# Patient Record
Sex: Female | Born: 1935 | Race: Black or African American | Hispanic: No | Smoking: Never smoker
Health system: Southern US, Community
[De-identification: ages and names within clinical notes are randomized; demographics above are authoritative.]

## PROBLEM LIST (undated history)

## (undated) DIAGNOSIS — R5381 Other malaise: Secondary | ICD-10-CM

## (undated) DIAGNOSIS — L0390 Cellulitis, unspecified: Secondary | ICD-10-CM

## (undated) DIAGNOSIS — Z0000 Encounter for general adult medical examination without abnormal findings: Secondary | ICD-10-CM

## (undated) DIAGNOSIS — R030 Elevated blood-pressure reading, without diagnosis of hypertension: Secondary | ICD-10-CM

## (undated) DIAGNOSIS — M19019 Primary osteoarthritis, unspecified shoulder: Secondary | ICD-10-CM

## (undated) DIAGNOSIS — I890 Lymphedema, not elsewhere classified: Secondary | ICD-10-CM

## (undated) DIAGNOSIS — N289 Disorder of kidney and ureter, unspecified: Secondary | ICD-10-CM

## (undated) DIAGNOSIS — D3A8 Other benign neuroendocrine tumors: Secondary | ICD-10-CM

## (undated) DIAGNOSIS — M1611 Unilateral primary osteoarthritis, right hip: Secondary | ICD-10-CM

## (undated) DIAGNOSIS — M6281 Muscle weakness (generalized): Secondary | ICD-10-CM

## (undated) DIAGNOSIS — M7550 Bursitis of unspecified shoulder: Secondary | ICD-10-CM

## (undated) DIAGNOSIS — C801 Malignant (primary) neoplasm, unspecified: Secondary | ICD-10-CM

## (undated) DIAGNOSIS — Z9119 Patient's noncompliance with other medical treatment and regimen: Secondary | ICD-10-CM

## (undated) DIAGNOSIS — I10 Essential (primary) hypertension: Secondary | ICD-10-CM

## (undated) DIAGNOSIS — Z91199 Patient's noncompliance with other medical treatment and regimen due to unspecified reason: Secondary | ICD-10-CM

## (undated) DIAGNOSIS — K140 Glossitis: Secondary | ICD-10-CM

## (undated) DIAGNOSIS — C50919 Malignant neoplasm of unspecified site of unspecified female breast: Secondary | ICD-10-CM

## (undated) DIAGNOSIS — R350 Frequency of micturition: Secondary | ICD-10-CM

## (undated) DIAGNOSIS — N63 Unspecified lump in unspecified breast: Secondary | ICD-10-CM

## (undated) DIAGNOSIS — M2550 Pain in unspecified joint: Secondary | ICD-10-CM

## (undated) DIAGNOSIS — K219 Gastro-esophageal reflux disease without esophagitis: Secondary | ICD-10-CM

## (undated) DIAGNOSIS — Z79811 Long term (current) use of aromatase inhibitors: Secondary | ICD-10-CM

## (undated) DIAGNOSIS — L304 Erythema intertrigo: Secondary | ICD-10-CM

## (undated) DIAGNOSIS — J302 Other seasonal allergic rhinitis: Secondary | ICD-10-CM

## (undated) DIAGNOSIS — R928 Other abnormal and inconclusive findings on diagnostic imaging of breast: Secondary | ICD-10-CM

## (undated) DIAGNOSIS — M069 Rheumatoid arthritis, unspecified: Secondary | ICD-10-CM

## (undated) DIAGNOSIS — L209 Atopic dermatitis, unspecified: Secondary | ICD-10-CM

## (undated) DIAGNOSIS — M1990 Unspecified osteoarthritis, unspecified site: Secondary | ICD-10-CM

## (undated) DIAGNOSIS — R1310 Dysphagia, unspecified: Secondary | ICD-10-CM

## (undated) HISTORY — DX: Unspecified osteoarthritis, unspecified site: M19.90

## (undated) HISTORY — DX: Pain in unspecified joint: M25.50

## (undated) HISTORY — DX: Cellulitis, unspecified: L03.90

## (undated) HISTORY — DX: Glossitis: K14.0

## (undated) HISTORY — DX: Bursitis of unspecified shoulder: M75.50

## (undated) HISTORY — DX: Other seasonal allergic rhinitis: J30.2

## (undated) HISTORY — DX: Malignant neoplasm of unspecified site of unspecified female breast: C50.919

## (undated) HISTORY — DX: Dysphagia, unspecified: R13.10

## (undated) HISTORY — DX: Other malaise: R53.81

## (undated) HISTORY — DX: Patient's noncompliance with other medical treatment and regimen due to unspecified reason: Z91.199

## (undated) HISTORY — DX: Encounter for general adult medical examination without abnormal findings: Z00.00

## (undated) HISTORY — DX: Atopic dermatitis, unspecified: L20.9

## (undated) HISTORY — DX: Erythema intertrigo: L30.4

## (undated) HISTORY — DX: Other benign neuroendocrine tumors: D3A.8

## (undated) HISTORY — DX: Lymphedema, not elsewhere classified: I89.0

## (undated) HISTORY — DX: Unilateral primary osteoarthritis, right hip: M16.11

## (undated) HISTORY — DX: Gastro-esophageal reflux disease without esophagitis: K21.9

## (undated) HISTORY — DX: Elevated blood-pressure reading, without diagnosis of hypertension: R03.0

## (undated) HISTORY — DX: Frequency of micturition: R35.0

## (undated) HISTORY — DX: Primary osteoarthritis, unspecified shoulder: M19.019

## (undated) HISTORY — DX: Patient's noncompliance with other medical treatment and regimen: Z91.19

## (undated) HISTORY — DX: Rheumatoid arthritis, unspecified: M06.9

## (undated) HISTORY — DX: Disorder of kidney and ureter, unspecified: N28.9

## (undated) HISTORY — DX: Unspecified lump in unspecified breast: N63.0

## (undated) HISTORY — DX: Long term (current) use of aromatase inhibitors: Z79.811

## (undated) HISTORY — DX: Morbid (severe) obesity due to excess calories: E66.01

## (undated) HISTORY — DX: Muscle weakness (generalized): M62.81

## (undated) HISTORY — DX: Other abnormal and inconclusive findings on diagnostic imaging of breast: R92.8

## (undated) HISTORY — DX: Essential (primary) hypertension: I10

## (undated) HISTORY — DX: Malignant (primary) neoplasm, unspecified: C80.1

---

## 2014-05-05 DIAGNOSIS — C649 Malignant neoplasm of unspecified kidney, except renal pelvis: Secondary | ICD-10-CM | POA: Insufficient documentation

## 2015-12-25 DIAGNOSIS — C50919 Malignant neoplasm of unspecified site of unspecified female breast: Secondary | ICD-10-CM | POA: Insufficient documentation

## 2015-12-25 DIAGNOSIS — L539 Erythematous condition, unspecified: Secondary | ICD-10-CM | POA: Insufficient documentation

## 2015-12-26 DIAGNOSIS — E8729 Other acidosis: Secondary | ICD-10-CM | POA: Insufficient documentation

## 2015-12-26 DIAGNOSIS — E872 Acidosis: Secondary | ICD-10-CM | POA: Insufficient documentation

## 2015-12-26 DIAGNOSIS — D72829 Elevated white blood cell count, unspecified: Secondary | ICD-10-CM | POA: Insufficient documentation

## 2015-12-28 DIAGNOSIS — K5900 Constipation, unspecified: Secondary | ICD-10-CM | POA: Insufficient documentation

## 2015-12-28 DIAGNOSIS — B370 Candidal stomatitis: Secondary | ICD-10-CM | POA: Insufficient documentation

## 2015-12-29 DIAGNOSIS — L511 Stevens-Johnson syndrome: Secondary | ICD-10-CM | POA: Insufficient documentation

## 2015-12-30 ENCOUNTER — Non-Acute Institutional Stay (SKILLED_NURSING_FACILITY): Payer: Medicare Other | Admitting: Internal Medicine

## 2015-12-30 DIAGNOSIS — K219 Gastro-esophageal reflux disease without esophagitis: Secondary | ICD-10-CM

## 2015-12-30 DIAGNOSIS — L511 Stevens-Johnson syndrome: Secondary | ICD-10-CM

## 2015-12-30 DIAGNOSIS — K5909 Other constipation: Secondary | ICD-10-CM | POA: Diagnosis not present

## 2015-12-30 DIAGNOSIS — I10 Essential (primary) hypertension: Secondary | ICD-10-CM | POA: Diagnosis not present

## 2015-12-30 DIAGNOSIS — E559 Vitamin D deficiency, unspecified: Secondary | ICD-10-CM | POA: Diagnosis not present

## 2015-12-30 DIAGNOSIS — C50912 Malignant neoplasm of unspecified site of left female breast: Secondary | ICD-10-CM | POA: Diagnosis not present

## 2015-12-30 DIAGNOSIS — C50911 Malignant neoplasm of unspecified site of right female breast: Secondary | ICD-10-CM

## 2015-12-30 DIAGNOSIS — B370 Candidal stomatitis: Secondary | ICD-10-CM

## 2015-12-30 DIAGNOSIS — K5900 Constipation, unspecified: Secondary | ICD-10-CM

## 2015-12-30 NOTE — Progress Notes (Addendum)
MRN: [id number] Name: Sharon Velasquez  Sex: female Age: 82 y.o. DOB: 04/07/1931  [place] #: [facility] Facility/Room:102 Level Of Care: SNF Provider: Inocencio Homes D Emergency Contacts: No emergency contact information on file.  Code Status:   Allergies: Latex  Chief Complaint  Patient presents with  . New Admit To SNF    HPI: Patient is 82 y.o. female with B breast CA on chemo and morbid obesity who was admitted to [facility] from 4/8-13 with an extensive exfoliating rash felt to be atypical stevens johnson syndrome. Pt met SIRS criteria on admission, later ruled out, and developed hypernatremia and diarrhea, C diff neg. On resolution pt was admitted to SNF for generalized weakness. Whileat SNF pt will be followed for breast CA, tx with aromasin, GERD, tx with prilosec, and vitd def, tx with supplementation.  Past Medical History  Diagnosis Date  . Hypertension   . GERD (gastroesophageal reflux disease)   . Cancer (HCC)     breast  . Seasonal allergies     History reviewed. No pertinent past surgical history.    Medication List       This list is accurate as of: 12/30/15 11:59 PM.  Always use your most recent med list.               aspirin 81 MG chewable tablet  Chew 81 mg by mouth.     Calcium Carbonate-Vitamin D 600-200 MG-UNIT Caps  Take by mouth.     exemestane 25 MG tablet  Commonly known as:  AROMASIN  Take 25 mg by mouth.     ferrous sulfate 325 (65 FE) MG tablet  Take 325 mg by mouth.     Fish Oil 1000 MG Caps  Take 1,200 mg by mouth.     fluconazole 100 MG tablet  Commonly known as:  DIFLUCAN  Take 100 mg by mouth daily. For 2 days     fluconazole 100 MG tablet  Commonly known as:  DIFLUCAN  Take 100 mg by mouth.     folic acid 1 MG tablet  Commonly known as:  FOLVITE  Take 1 mg by mouth.     Garlic 546 MG Tabs  Take by mouth.     hydrOXYzine 25 MG tablet  Commonly known as:  ATARAX/VISTARIL  Take 25 mg by mouth.     ibuprofen 200 MG  tablet  Commonly known as:  ADVIL,MOTRIN  Take 200 mg by mouth.     loperamide 2 MG capsule  Commonly known as:  IMODIUM  Take 2 mg by mouth.     loratadine 10 MG tablet  Commonly known as:  CLARITIN  Take 10 mg by mouth.     multivitamin with minerals tablet  Take 1 tablet by mouth daily.     omeprazole 20 MG capsule  Commonly known as:  PRILOSEC  Take 20 mg by mouth.     predniSONE 20 MG tablet  Commonly known as:  DELTASONE  Take 40 mg by mouth.     triamcinolone cream 0.1 %  Commonly known as:  KENALOG  Apply topically.     Vitamin D (Cholecalciferol) 400 units Caps  Take by mouth.        Meds ordered this encounter  Medications  . aspirin 81 MG chewable tablet    Sig: Chew 81 mg by mouth.  . Calcium Carbonate-Vitamin D 600-200 MG-UNIT CAPS    Sig: Take by mouth.  Marland Kitchen exemestane (AROMASIN) 25 MG tablet  Sig: Take 25 mg by mouth.  . ferrous sulfate 325 (65 FE) MG tablet    Sig: Take 325 mg by mouth.  . folic acid (FOLVITE) 1 MG tablet    Sig: Take 1 mg by mouth.  . Garlic 376 MG TABS    Sig: Take by mouth.  . hydrOXYzine (ATARAX/VISTARIL) 25 MG tablet    Sig: Take 25 mg by mouth.  . fluconazole (DIFLUCAN) 100 MG tablet    Sig: Take 100 mg by mouth.  . Omega-3 Fatty Acids (FISH OIL) 1000 MG CAPS    Sig: Take 1,200 mg by mouth.  . Vitamin D, Cholecalciferol, 400 units CAPS    Sig: Take by mouth.  Marland Kitchen ibuprofen (ADVIL,MOTRIN) 200 MG tablet    Sig: Take 200 mg by mouth.  . loperamide (IMODIUM) 2 MG capsule    Sig: Take 2 mg by mouth.  . loratadine (CLARITIN) 10 MG tablet    Sig: Take 10 mg by mouth.  Marland Kitchen omeprazole (PRILOSEC) 20 MG capsule    Sig: Take 20 mg by mouth.  . predniSONE (DELTASONE) 20 MG tablet    Sig: Take 40 mg by mouth.  . triamcinolone cream (KENALOG) 0.1 %    Sig: Apply topically.  . fluconazole (DIFLUCAN) 100 MG tablet    Sig: Take 100 mg by mouth daily. For 2 days  . Multiple Vitamins-Minerals (MULTIVITAMIN WITH MINERALS) tablet     Sig: Take 1 tablet by mouth daily.     There is no immunization history on file for this patient.  Social History  Substance Use Topics  . Smoking status: Never Smoker   . Smokeless tobacco: Not on file  . Alcohol Use: Not on file    Family history is + CA   Review of Systems  DATA OBTAINED: from patient, nurse GENERAL:  no fevers, fatigue, appetite changes SKIN:  itching, rash much better EYES: No eye pain, redness, discharge EARS: No earache, tinnitus, change in hearing NOSE: No congestion, drainage or bleeding  MOUTH/THROAT: No mouth or tooth pain, No sore throat RESPIRATORY: No cough, wheezing, SOB CARDIAC: No chest pain, palpitations, lower extremity edema  GI: No abdominal pain, No N/V/D or constipation, No heartburn or reflux  GU: No dysuria, frequency or urgency, or incontinence  MUSCULOSKELETAL: No unrelieved bone/joint pain NEUROLOGIC: No headache, dizziness or focal weakness PSYCHIATRIC: No c/o anxiety or sadness   Filed Vitals:   01/06/16 2301  BP: 136/79  Pulse: 92  Temp: 97 F (36.1 C)  Resp: 18    SpO2 Readings from Last 1 Encounters:  No data found for SpO2        Physical Exam  GENERAL APPEARANCE: Alert, conversant,  No acute distress.  SKIN: superficial dry flalking of skin but no redness or active lesions HEAD: Normocephalic, atraumatic  EYES: Conjunctiva/lids clear. Pupils round, reactive. EOMs intact.  EARS: External exam WNL, canals clear. Hearing grossly normal.  NOSE: No deformity or discharge.  MOUTH/THROAT: Lips w/o lesions  RESPIRATORY: Breathing is even, unlabored. Lung sounds are clear   CARDIOVASCULAR: Heart RRR no murmurs, rubs or gallops. No peripheral edema.   GASTROINTESTINAL: Abdomen is soft, non-tender, not distended w/ normal bowel sounds. GENITOURINARY: Bladder non tender, not distended  MUSCULOSKELETAL: No abnormal joints or musculature NEUROLOGIC:  Cranial nerves 2-12 grossly intact. Moves all extremities   PSYCHIATRIC: Mood and affect appropriate to situation, no behavioral issues  Patient Active Problem List   Diagnosis Date Noted  . Vitamin D deficiency 01/07/2016  .  Hypertension   . GERD (gastroesophageal reflux disease)   . Cancer ([place])   . Seasonal allergies   . Stevens-Johnson syndrome ([place]) 12/29/2015  . Candida infection of mouth 12/28/2015  . Colonic constipation 12/28/2015  . Acidosis, hyperchloremic 12/26/2015  . Elevated WBC count 12/26/2015  . Malignant neoplasm of female breast ([place]) 12/25/2015  . Erythematous condition 12/25/2015  . Morbid (severe) obesity due to excess calories ([place]) 12/25/2015  . Malignant neoplasm of kidney ([place]) 05/05/2014    CBC No results found for: WBC, RBC, HGB, HCT, PLT, MCV, LYMPHSABS, MONOABS, EOSABS, BASOSABS  CMP  No results found for: NA, K, CL, CO2, GLUCOSE, BUN, CREATININE, CALCIUM, PROT, ALBUMIN, AST, ALT, ALKPHOS, BILITOT, GFRNONAA, GFRAA  No results found for: HGBA1C   Patient was never admitted.  Not all labs, radiology exams or other studies done during hospitalization come through on my EPIC note; however they are reviewed by me.    Assessment and Plan  Stevens-Johnson syndrome (HCC) Atypical, covering 70-79% of body, felt to be form viral illness or recent antibiotic use; resolving with prednisone pills and tiamcinolone SNF - cont prednisonefor 2 weeks  Malignant neoplasm of female breast ([place]) SNF - will cont tx with aromasin  Hypertension SNF - controlled on garlic 953 mg daily  GERD (gastroesophageal reflux disease) SNF - not stated as uncontrolled;cont prilosec 20 mg daily   Colonic constipation SNF - pt developed diarrhe 2/2 ; was neg for C diff;improved with tx of constipation  Candida infection of mouth SNF - will cont diflycan for 2 more days for total tx of 3 days  Morbid (severe) obesity due to excess calories (HCC) SNF - appropriate diet  Vitamin D deficiency SNF - cont repletion with 400 u   daily   Time spent > 45 min;> 50% of time with patient was spent reviewing records, labs, tests and studies, counseling and developing plan of care  Hennie Duos, MD

## 2016-01-06 ENCOUNTER — Encounter: Payer: Self-pay | Admitting: Internal Medicine

## 2016-01-06 DIAGNOSIS — C801 Malignant (primary) neoplasm, unspecified: Secondary | ICD-10-CM | POA: Insufficient documentation

## 2016-01-06 DIAGNOSIS — J302 Other seasonal allergic rhinitis: Secondary | ICD-10-CM | POA: Insufficient documentation

## 2016-01-06 DIAGNOSIS — K219 Gastro-esophageal reflux disease without esophagitis: Secondary | ICD-10-CM | POA: Insufficient documentation

## 2016-01-06 DIAGNOSIS — I110 Hypertensive heart disease with heart failure: Secondary | ICD-10-CM | POA: Insufficient documentation

## 2016-01-07 DIAGNOSIS — E559 Vitamin D deficiency, unspecified: Secondary | ICD-10-CM | POA: Insufficient documentation

## 2016-01-07 NOTE — Assessment & Plan Note (Signed)
SNF - cont repletion with 400 u  daily

## 2016-01-07 NOTE — Assessment & Plan Note (Signed)
SNF - controlled on garlic 123XX123 mg daily

## 2016-01-07 NOTE — Assessment & Plan Note (Signed)
Atypical, covering 70-79% of body, felt to be form viral illness or recent antibiotic use; resolving with prednisone pills and tiamcinolone SNF - cont prednisonefor 2 weeks

## 2016-01-07 NOTE — Assessment & Plan Note (Signed)
SNF - not stated as uncontrolled;cont prilosec 20 mg daily

## 2016-01-07 NOTE — Assessment & Plan Note (Signed)
SNF - will cont diflycan for 2 more days for total tx of 3 days

## 2016-01-07 NOTE — Assessment & Plan Note (Signed)
SNF - pt developed diarrhe 2/2 ; was neg for C diff;improved with tx of constipation

## 2016-01-07 NOTE — Assessment & Plan Note (Signed)
SNF - appropriate diet

## 2016-01-07 NOTE — Assessment & Plan Note (Signed)
SNF - will cont tx with aromasin

## 2016-01-20 ENCOUNTER — Encounter: Payer: Self-pay | Admitting: Internal Medicine

## 2016-01-20 ENCOUNTER — Non-Acute Institutional Stay (SKILLED_NURSING_FACILITY): Payer: Medicare Other | Admitting: Internal Medicine

## 2016-01-20 DIAGNOSIS — L539 Erythematous condition, unspecified: Secondary | ICD-10-CM | POA: Diagnosis not present

## 2016-01-20 NOTE — Progress Notes (Signed)
MRN: [id number] Name: Sharon Velasquez  Sex: female Age: 82 y.o. DOB: 02-20-1931  [place] #: [facility] Facility/Room: 103 P Level Of Care: SNF Provider: Hennie Duos Emergency Contacts: Extended Emergency Contact Information Primary Emergency Contact: Malachi-Falero,Faith Address: [address] [place], [place] [address] [place] Phone: [phone] Mobile Phone: [phone] Relation: Daughter Secondary Emergency Contact: Malena Peer [place] Mobile Phone: [phone] Relation: Other  Code Status:   Allergies: Latex  Chief Complaint  Patient presents with  . Acute Visit    HPI: Patient is 82 y.o. female who was admitted to SNF while still being treated for what was felt to be atypical Kathreen Cosier syndrome with prednisone taper and triamcinolone cream. It did appear rash was resolving but nursing called me into pt's room today. She has a rash on her trunk and legs that is new and has been noted over the past day. Pt admits it is itchy and may be the way the rash started in the hospital. Pt has no other symptoms like fever or cold sx, muscle aches or anything that feels viral. Pt has no c/o mouth or throat pain.Nothing makes it better, although something for itching would help per pt. Pt's steroid taper ended 4/26 and the triamcinolone cream is still being used.  Past Medical History  Diagnosis Date  . Hypertension   . GERD (gastroesophageal reflux disease)   . Cancer (HCC)     breast  . Seasonal allergies     History reviewed. No pertinent past surgical history.    Medication List       This list is accurate as of: 01/20/16 11:59 PM.  Always use your most recent med list.               aspirin 81 MG chewable tablet  Chew 81 mg by mouth.     Calcium Carbonate-Vitamin D 600-200 MG-UNIT Caps  Take by mouth.     exemestane 25 MG tablet  Commonly known as:  AROMASIN  Take 25 mg by mouth.     ferrous  sulfate 325 (65 FE) MG tablet  Take 325 mg by mouth.     Fish Oil 1000 MG Caps  Take 1,200 mg by mouth.     folic acid 1 MG tablet  Commonly known as:  FOLVITE  Take 1 mg by mouth.     Garlic 123XX123 MG Tabs  Take by mouth.     hydrOXYzine 25 MG tablet  Commonly known as:  ATARAX/VISTARIL  Take 25 mg by mouth.     ibuprofen 200 MG tablet  Commonly known as:  ADVIL,MOTRIN  Take 200 mg by mouth.     lactobacillus acidophilus Tabs tablet  Take 2 tablets by mouth daily.     loperamide 2 MG capsule  Commonly known as:  IMODIUM  Take 2 mg by mouth.     loratadine 10 MG tablet  Commonly known as:  CLARITIN  Take 10 mg by mouth.     multivitamin with minerals tablet  Take 1 tablet by mouth daily.     pantoprazole 40 MG tablet  Commonly known as:  PROTONIX  Take 40 mg by mouth daily.     triamcinolone cream 0.1 %  Commonly known as:  KENALOG  Apply topically.     Vitamin D (Cholecalciferol) 400 units Caps  Take by mouth.        Meds ordered this  encounter  Medications  . lactobacillus acidophilus (BACID) TABS tablet    Sig: Take 2 tablets by mouth daily.  . pantoprazole (PROTONIX) 40 MG tablet    Sig: Take 40 mg by mouth daily.    Immunization History  Administered Date(s) Administered  . PPD Test 01/13/2016    Social History  Substance Use Topics  . Smoking status: Never Smoker   . Smokeless tobacco: Not on file  . Alcohol Use: Not on file    Review of Systems  DATA OBTAINED: from patient, nurse GENERAL:  no fevers, fatigue, appetite changes SKIN: as per HPI HEENT: No complaint RESPIRATORY: No cough, wheezing, SOB CARDIAC: No chest pain, palpitations, lower extremity edema  GI: No abdominal pain, No N/V/D or constipation, No heartburn or reflux  GU: No dysuria, frequency or urgency, or incontinence  MUSCULOSKELETAL: No unrelieved bone/joint pain NEUROLOGIC: No headache, dizziness  PSYCHIATRIC: No overt anxiety or sadness  Filed Vitals:    01/20/16 1608  BP: 132/70  Pulse: 78  Temp: 95.9 F (35.5 C)  Resp: 18    Physical Exam  GENERAL APPEARANCE: Alert, conversant, No acute distress obese BF SKIN: raise mod pink maplike eruption topped with fine scale over trunk and thighs HEENT: remarkable that there is no mucous membrane involvment RESPIRATORY: Breathing is even, unlabored. Lung sounds are clear   CARDIOVASCULAR: Heart RRR no murmurs, rubs or gallops. No peripheral edema  GASTROINTESTINAL: Abdomen is soft, non-tender, not distended w/ normal bowel sounds.  GENITOURINARY: Bladder non tender, not distended  MUSCULOSKELETAL: No abnormal joints or musculature NEUROLOGIC: Cranial nerves 2-12 grossly intact. Moves all extremities PSYCHIATRIC: Mood and affect appropriate to situation, no behavioral issues  Patient Active Problem List   Diagnosis Date Noted  . Vitamin D deficiency 01/07/2016  . Hypertension   . GERD (gastroesophageal reflux disease)   . Cancer ([place])   . Seasonal allergies   . Stevens-Johnson syndrome ([place]) 12/29/2015  . Candida infection of mouth 12/28/2015  . Colonic constipation 12/28/2015  . Acidosis, hyperchloremic 12/26/2015  . Elevated WBC count 12/26/2015  . Malignant neoplasm of female breast ([place]) 12/25/2015  . Erythematous condition 12/25/2015  . Morbid (severe) obesity due to excess calories ([place]) 12/25/2015  . Malignant neoplasm of kidney ([place]) 05/05/2014    CBC No results found for: WBC, RBC, HGB, HCT, PLT, MCV, LYMPHSABS, MONOABS, EOSABS, BASOSABS  CMP  No results found for: NA, K, CL, CO2, GLUCOSE, BUN, CREATININE, CALCIUM, PROT, ALBUMIN, AST, ALT, ALKPHOS, BILITOT, GFRNONAA, GFRAA  Assessment and Plan  Erythematous condition To me this looks likea drug rash but pt on no new drugs. In any case I have put her on hydroxyzine 25 mg BID and 50 mg qHS and will continue steroid cream. Although it is usually impossible to get a derm appointment for our pt's this pt has seen derm prior  and it looks like she is going to be able to get appt with them. I have reviewed Kathreen Cosier syndrome and TEN and it does not appear that oral steroids before the fact are really going to help. We will monitor for blisters and bullae and based on her prior hx get her back to the hospital if this occurss. In the meantime will monitor. Pt's family is aware.   Time spent . 35 min;> 50% of time with patient was spent reviewing records, labs, tests and studies, counseling and developing plan of care  Hennie Duos, MD

## 2016-01-21 ENCOUNTER — Encounter: Payer: Self-pay | Admitting: Internal Medicine

## 2016-01-21 NOTE — Assessment & Plan Note (Signed)
To me this looks likea drug rash but pt on no new drugs. In any case I have put her on hydroxyzine 25 mg BID and 50 mg qHS and will continue steroid cream. Although it is usually impossible to get a derm appointment for our pt's this pt has seen derm prior and it looks like she is going to be able to get appt with them. I have reviewed Kathreen Cosier syndrome and TEN and it does not appear that oral steroids before the fact are really going to help. We will monitor for blisters and bullae and based on her prior hx get her back to the hospital if this occurss. In the meantime will monitor. Pt's family is aware.

## 2016-01-30 ENCOUNTER — Encounter: Payer: Self-pay | Admitting: Internal Medicine

## 2016-01-30 ENCOUNTER — Inpatient Hospital Stay (HOSPITAL_COMMUNITY)
Admission: EM | Admit: 2016-01-30 | Discharge: 2016-02-06 | DRG: 689 | Disposition: A | Discharge status: Skilled Nursing Facility | Payer: Medicare Other | Attending: Internal Medicine | Admitting: Internal Medicine

## 2016-01-30 ENCOUNTER — Emergency Department (HOSPITAL_COMMUNITY): Payer: Medicare Other

## 2016-01-30 ENCOUNTER — Encounter (HOSPITAL_COMMUNITY): Payer: Self-pay

## 2016-01-30 ENCOUNTER — Non-Acute Institutional Stay (SKILLED_NURSING_FACILITY): Payer: Medicare Other | Admitting: Internal Medicine

## 2016-01-30 DIAGNOSIS — C50911 Malignant neoplasm of unspecified site of right female breast: Secondary | ICD-10-CM | POA: Diagnosis not present

## 2016-01-30 DIAGNOSIS — I959 Hypotension, unspecified: Secondary | ICD-10-CM | POA: Diagnosis not present

## 2016-01-30 DIAGNOSIS — R443 Hallucinations, unspecified: Secondary | ICD-10-CM

## 2016-01-30 DIAGNOSIS — R4781 Slurred speech: Secondary | ICD-10-CM | POA: Diagnosis not present

## 2016-01-30 DIAGNOSIS — E860 Dehydration: Secondary | ICD-10-CM | POA: Diagnosis present

## 2016-01-30 DIAGNOSIS — L539 Erythematous condition, unspecified: Secondary | ICD-10-CM | POA: Diagnosis present

## 2016-01-30 DIAGNOSIS — G9340 Encephalopathy, unspecified: Secondary | ICD-10-CM | POA: Diagnosis present

## 2016-01-30 DIAGNOSIS — E440 Moderate protein-calorie malnutrition: Secondary | ICD-10-CM | POA: Diagnosis not present

## 2016-01-30 DIAGNOSIS — I10 Essential (primary) hypertension: Secondary | ICD-10-CM | POA: Diagnosis present

## 2016-01-30 DIAGNOSIS — E43 Unspecified severe protein-calorie malnutrition: Secondary | ICD-10-CM | POA: Diagnosis present

## 2016-01-30 DIAGNOSIS — E162 Hypoglycemia, unspecified: Secondary | ICD-10-CM | POA: Diagnosis not present

## 2016-01-30 DIAGNOSIS — L511 Stevens-Johnson syndrome: Secondary | ICD-10-CM | POA: Diagnosis present

## 2016-01-30 DIAGNOSIS — R509 Fever, unspecified: Secondary | ICD-10-CM | POA: Diagnosis not present

## 2016-01-30 DIAGNOSIS — Z853 Personal history of malignant neoplasm of breast: Secondary | ICD-10-CM

## 2016-01-30 DIAGNOSIS — C801 Malignant (primary) neoplasm, unspecified: Secondary | ICD-10-CM | POA: Diagnosis not present

## 2016-01-30 DIAGNOSIS — R0989 Other specified symptoms and signs involving the circulatory and respiratory systems: Secondary | ICD-10-CM

## 2016-01-30 DIAGNOSIS — L26 Exfoliative dermatitis: Secondary | ICD-10-CM | POA: Diagnosis present

## 2016-01-30 DIAGNOSIS — Z7982 Long term (current) use of aspirin: Secondary | ICD-10-CM | POA: Diagnosis not present

## 2016-01-30 DIAGNOSIS — C50912 Malignant neoplasm of unspecified site of left female breast: Secondary | ICD-10-CM

## 2016-01-30 DIAGNOSIS — R4701 Aphasia: Secondary | ICD-10-CM | POA: Diagnosis present

## 2016-01-30 DIAGNOSIS — C50919 Malignant neoplasm of unspecified site of unspecified female breast: Secondary | ICD-10-CM | POA: Diagnosis present

## 2016-01-30 DIAGNOSIS — I110 Hypertensive heart disease with heart failure: Secondary | ICD-10-CM | POA: Diagnosis present

## 2016-01-30 DIAGNOSIS — N390 Urinary tract infection, site not specified: Secondary | ICD-10-CM | POA: Diagnosis present

## 2016-01-30 DIAGNOSIS — K219 Gastro-esophageal reflux disease without esophagitis: Secondary | ICD-10-CM | POA: Diagnosis present

## 2016-01-30 DIAGNOSIS — R531 Weakness: Secondary | ICD-10-CM

## 2016-01-30 DIAGNOSIS — E876 Hypokalemia: Secondary | ICD-10-CM | POA: Diagnosis not present

## 2016-01-30 DIAGNOSIS — I4581 Long QT syndrome: Secondary | ICD-10-CM | POA: Diagnosis present

## 2016-01-30 DIAGNOSIS — B9561 Methicillin susceptible Staphylococcus aureus infection as the cause of diseases classified elsewhere: Secondary | ICD-10-CM | POA: Diagnosis present

## 2016-01-30 DIAGNOSIS — T68XXXA Hypothermia, initial encounter: Secondary | ICD-10-CM

## 2016-01-30 DIAGNOSIS — Z6841 Body Mass Index (BMI) 40.0 and over, adult: Secondary | ICD-10-CM

## 2016-01-30 DIAGNOSIS — R29810 Facial weakness: Secondary | ICD-10-CM

## 2016-01-30 DIAGNOSIS — R680 Hypothermia, not associated with low environmental temperature: Secondary | ICD-10-CM | POA: Diagnosis present

## 2016-01-30 DIAGNOSIS — A4901 Methicillin susceptible Staphylococcus aureus infection, unspecified site: Secondary | ICD-10-CM | POA: Diagnosis present

## 2016-01-30 DIAGNOSIS — E870 Hyperosmolality and hypernatremia: Secondary | ICD-10-CM | POA: Diagnosis present

## 2016-01-30 LAB — CBC WITH DIFFERENTIAL/PLATELET
BASOS ABS: 0 10*3/uL (ref 0.0–0.1)
Basophils Relative: 0 %
EOS PCT: 2 %
Eosinophils Absolute: 0.2 10*3/uL (ref 0.0–0.7)
HEMATOCRIT: 40.3 % (ref 36.0–46.0)
HEMOGLOBIN: 12.9 g/dL (ref 12.0–15.0)
LYMPHS PCT: 26 %
Lymphs Abs: 1.9 10*3/uL (ref 0.7–4.0)
MCH: 27.3 pg (ref 26.0–34.0)
MCHC: 32 g/dL (ref 30.0–36.0)
MCV: 85.2 fL (ref 78.0–100.0)
MONO ABS: 0.7 10*3/uL (ref 0.1–1.0)
Monocytes Relative: 10 %
Neutro Abs: 4.4 10*3/uL (ref 1.7–7.7)
Neutrophils Relative %: 62 %
Platelets: 148 10*3/uL — ABNORMAL LOW (ref 150–400)
RBC: 4.73 MIL/uL (ref 3.87–5.11)
RDW: 18.9 % — ABNORMAL HIGH (ref 11.5–15.5)
WBC: 7.2 10*3/uL (ref 4.0–10.5)

## 2016-01-30 LAB — URINALYSIS, ROUTINE W REFLEX MICROSCOPIC
Glucose, UA: NEGATIVE mg/dL
Hgb urine dipstick: NEGATIVE
Ketones, ur: 15 mg/dL — AB
NITRITE: NEGATIVE
PROTEIN: NEGATIVE mg/dL
SPECIFIC GRAVITY, URINE: 1.023 (ref 1.005–1.030)
pH: 5 (ref 5.0–8.0)

## 2016-01-30 LAB — CORTISOL: Cortisol, Plasma: 28.6 ug/dL

## 2016-01-30 LAB — CBG MONITORING, ED
GLUCOSE-CAPILLARY: 67 mg/dL (ref 65–99)
Glucose-Capillary: 64 mg/dL — ABNORMAL LOW (ref 65–99)
Glucose-Capillary: 70 mg/dL (ref 65–99)

## 2016-01-30 LAB — COMPREHENSIVE METABOLIC PANEL
ALBUMIN: 2.7 g/dL — AB (ref 3.5–5.0)
ALK PHOS: 166 U/L — AB (ref 38–126)
ALT: 77 U/L — ABNORMAL HIGH (ref 14–54)
AST: 80 U/L — AB (ref 15–41)
Anion gap: 12 (ref 5–15)
BILIRUBIN TOTAL: 0.4 mg/dL (ref 0.3–1.2)
BUN: 24 mg/dL — AB (ref 6–20)
CALCIUM: 9.1 mg/dL (ref 8.9–10.3)
CO2: 24 mmol/L (ref 22–32)
Chloride: 115 mmol/L — ABNORMAL HIGH (ref 101–111)
Creatinine, Ser: 1.18 mg/dL — ABNORMAL HIGH (ref 0.44–1.00)
GFR calc Af Amer: 48 mL/min — ABNORMAL LOW (ref >60)
GFR calc non Af Amer: 41 mL/min — ABNORMAL LOW (ref >60)
GLUCOSE: 77 mg/dL (ref 65–99)
POTASSIUM: 4.3 mmol/L (ref 3.5–5.1)
Sodium: 151 mmol/L — ABNORMAL HIGH (ref 135–145)
TOTAL PROTEIN: 6.6 g/dL (ref 6.5–8.1)

## 2016-01-30 LAB — I-STAT CHEM 8, ED
BUN: 27 mg/dL — AB (ref 6–20)
CREATININE: 1.2 mg/dL — AB (ref 0.44–1.00)
Calcium, Ion: 1.18 mmol/L (ref 1.13–1.30)
Chloride: 114 mmol/L — ABNORMAL HIGH (ref 101–111)
Glucose, Bld: 70 mg/dL (ref 65–99)
HEMATOCRIT: 45 % (ref 36.0–46.0)
Hemoglobin: 15.3 g/dL — ABNORMAL HIGH (ref 12.0–15.0)
POTASSIUM: 4.1 mmol/L (ref 3.5–5.1)
Sodium: 152 mmol/L — ABNORMAL HIGH (ref 135–145)
TCO2: 25 mmol/L (ref 0–100)

## 2016-01-30 LAB — URINE MICROSCOPIC-ADD ON

## 2016-01-30 LAB — TROPONIN I
Troponin I: 0.04 ng/mL — ABNORMAL HIGH (ref <0.031)
Troponin I: <0.03 ng/mL (ref <0.031)

## 2016-01-30 LAB — I-STAT CG4 LACTIC ACID, ED: Lactic Acid, Venous: 1.47 mmol/L (ref 0.5–2.0)

## 2016-01-30 LAB — MRSA PCR SCREENING: MRSA by PCR: NEGATIVE

## 2016-01-30 MED ORDER — ASPIRIN 81 MG PO CHEW
81.0000 mg | CHEWABLE_TABLET | Freq: Every day | ORAL | Status: DC
  Administered 2016-01-31 – 2016-02-06 (×7): 81 mg via ORAL
  Filled 2016-01-30 (×7): qty 1

## 2016-01-30 MED ORDER — DEXTROSE 5 % IV SOLN
1.0000 g | INTRAVENOUS | Status: DC
  Filled 2016-01-30: qty 10

## 2016-01-30 MED ORDER — BISACODYL 10 MG RE SUPP
10.0000 mg | RECTAL | Status: DC | PRN
  Administered 2016-02-02: 10 mg via RECTAL
  Filled 2016-01-30: qty 1

## 2016-01-30 MED ORDER — DEXTROSE-NACL 5-0.45 % IV SOLN
INTRAVENOUS | Status: DC
  Administered 2016-01-30: 23:00:00 via INTRAVENOUS

## 2016-01-30 MED ORDER — MAGNESIUM HYDROXIDE 400 MG/5ML PO SUSP
10.0000 mL | Freq: Every day | ORAL | Status: DC | PRN
  Filled 2016-01-30: qty 30

## 2016-01-30 MED ORDER — DEXTROSE 5 % IV SOLN
1.0000 g | Freq: Once | INTRAVENOUS | Status: AC
  Administered 2016-01-30: 1 g via INTRAVENOUS
  Filled 2016-01-30: qty 10

## 2016-01-30 MED ORDER — FOLIC ACID 1 MG PO TABS
1.0000 mg | ORAL_TABLET | Freq: Every day | ORAL | Status: DC
  Administered 2016-01-31 – 2016-02-06 (×7): 1 mg via ORAL
  Filled 2016-01-30 (×7): qty 1

## 2016-01-30 MED ORDER — FERROUS SULFATE 325 (65 FE) MG PO TABS
325.0000 mg | ORAL_TABLET | Freq: Every day | ORAL | Status: DC
  Administered 2016-02-01 – 2016-02-06 (×6): 325 mg via ORAL
  Filled 2016-01-30 (×6): qty 1

## 2016-01-30 MED ORDER — SODIUM CHLORIDE 0.9 % IV BOLUS (SEPSIS)
1000.0000 mL | Freq: Once | INTRAVENOUS | Status: AC
  Administered 2016-01-30: 1000 mL via INTRAVENOUS

## 2016-01-30 MED ORDER — SODIUM CHLORIDE 0.9 % IV BOLUS (SEPSIS)
500.0000 mL | Freq: Once | INTRAVENOUS | Status: AC
  Administered 2016-01-31: 500 mL via INTRAVENOUS

## 2016-01-30 MED ORDER — EXEMESTANE 25 MG PO TABS
25.0000 mg | ORAL_TABLET | Freq: Every day | ORAL | Status: DC
  Administered 2016-01-31 – 2016-02-06 (×7): 25 mg via ORAL
  Filled 2016-01-30 (×8): qty 1

## 2016-01-30 MED ORDER — ENSURE ENLIVE PO LIQD
237.0000 mL | Freq: Two times a day (BID) | ORAL | Status: DC
  Administered 2016-02-03 – 2016-02-06 (×4): 237 mL via ORAL
  Filled 2016-01-30: qty 237

## 2016-01-30 MED ORDER — LORATADINE 10 MG PO TABS
10.0000 mg | ORAL_TABLET | Freq: Every day | ORAL | Status: DC
  Administered 2016-01-31 – 2016-02-06 (×7): 10 mg via ORAL
  Filled 2016-01-30 (×7): qty 1

## 2016-01-30 MED ORDER — BACID PO TABS
2.0000 | ORAL_TABLET | Freq: Every day | ORAL | Status: DC
  Administered 2016-01-31 – 2016-02-06 (×7): 2 via ORAL
  Filled 2016-01-30 (×7): qty 2

## 2016-01-30 MED ORDER — HYDROXYZINE HCL 25 MG PO TABS
25.0000 mg | ORAL_TABLET | Freq: Three times a day (TID) | ORAL | Status: DC
  Administered 2016-01-31 – 2016-02-06 (×19): 25 mg via ORAL
  Filled 2016-01-30 (×19): qty 1

## 2016-01-30 MED ORDER — PANTOPRAZOLE SODIUM 40 MG PO TBEC
40.0000 mg | DELAYED_RELEASE_TABLET | Freq: Every day | ORAL | Status: DC
  Administered 2016-01-31 – 2016-02-06 (×7): 40 mg via ORAL
  Filled 2016-01-30 (×7): qty 1

## 2016-01-30 MED ORDER — HEPARIN SODIUM (PORCINE) 5000 UNIT/ML IJ SOLN
5000.0000 [IU] | Freq: Three times a day (TID) | INTRAMUSCULAR | Status: DC
  Administered 2016-01-30 – 2016-02-06 (×20): 5000 [IU] via SUBCUTANEOUS
  Filled 2016-01-30 (×20): qty 1

## 2016-01-30 MED ORDER — SODIUM CHLORIDE 0.9 % IV BOLUS (SEPSIS)
500.0000 mL | Freq: Once | INTRAVENOUS | Status: AC
  Administered 2016-01-30: 500 mL via INTRAVENOUS

## 2016-01-30 MED ORDER — ADULT MULTIVITAMIN W/MINERALS CH
1.0000 | ORAL_TABLET | Freq: Every day | ORAL | Status: DC
Start: 2016-01-31 — End: 2016-02-06
  Administered 2016-01-31 – 2016-02-06 (×7): 1 via ORAL
  Filled 2016-01-30 (×8): qty 1

## 2016-01-30 NOTE — Progress Notes (Signed)
Location:  [place] Room Number: 103/P Place of Service:  SNF [phone]) Provider:  [place] Lewis  No primary care provider on file.  No care team member to display  Extended Emergency Contact Information Primary Emergency Contact: Malachi-Oats,Faith Address: [address] [place], [place] [address] [place] Phone: [phone] Mobile Phone: [phone] Relation: Daughter Secondary Emergency Contact: Malena Peer [place] Mobile Phone: [phone] Relation: Other   Code Status: Full Code Goals of care: Advanced Directive information Advanced Directives 01/30/2016  Does patient have an advance directive? Yes  Does patient want to make changes to advanced directive? No - Patient declined  Copy of advanced directive(s) in chart? Yes  Would patient like information on creating an advanced directive? -     Chief Complaint  Patient presents with  . Acute Visit    Patients c/o Patient has been having hallucinations seeing people in her room    Also leaning to the left HPI:  Pt is a 82 y.o. female seen today for an acute visit for at times altered mental status with hallucinations seeing people in her room-she's also been leaning to her left paralumbar this is been occurring somewhat since earlier this weekend.  Vital signs are relatively stable-she does have a history of Stevens-Johnson syndrome which is resolved at appears fairly unremarkably thought possibly secondary to a viral issue or antibiotic use.  She does have a history of breast cancer as well and is on chemotherapy.  Currently she has no acute complaints other than feeling weak.  Apparently when she has hallucinations she will yell and talk to people who are not there    Past Medical History  Diagnosis Date  . Hypertension   . GERD (gastroesophageal reflux disease)   . Cancer ([place])     breast  . Seasonal allergies    No  past surgical history on file.  Allergies  Allergen Reactions  . Latex       Medication List       This list is accurate as of: 01/30/16 11:44 AM.  Always use your most recent med list.               aspirin 81 MG chewable tablet  Chew 81 mg by mouth.     Calcium Carbonate-Vitamin D 600-200 MG-UNIT Caps  Take 1 tablet by mouth once daily     exemestane 25 MG tablet  Commonly known as:  AROMASIN  Take 25 mg by mouth.     ferrous sulfate 325 (65 FE) MG tablet  Take 325 mg by mouth.     Fish Oil 1000 MG Caps  Take 1,200 mg by mouth.     folic acid 1 MG tablet  Commonly known as:  FOLVITE  Take 1 mg by mouth.     Garlic 123XX123 MG Tabs  Give 100 mg by mouth daily     hydrOXYzine 25 MG tablet  Commonly known as:  ATARAX/VISTARIL  Take 25 mg by mouth 2 (two) times daily.     ibuprofen 200 MG tablet  Commonly known as:  ADVIL,MOTRIN  Take 200 mg by mouth.     lactobacillus acidophilus Tabs tablet  Take 2 tablets by mouth daily.     loperamide 2 MG capsule  Commonly known as:  IMODIUM  Take 2 mg by mouth as needed for diarrhea or loose stools.  loratadine 10 MG tablet  Commonly known as:  CLARITIN  Take 10 mg by mouth.     multivitamin with minerals tablet  Take 1 tablet by mouth daily.     pantoprazole 40 MG tablet  Commonly known as:  PROTONIX  Take 40 mg by mouth daily.     triamcinolone cream 0.1 %  Commonly known as:  KENALOG  Apply topically twice a day to body surface     Vitamin D (Cholecalciferol) 400 units Caps  Give 1 capsule by mouth daily        Review of Systems  General does not complaining of fever or chills. Skin rash appears to have largely resolved she does not complain of itching.  Head ears eyes nose mouth and throat does not complaining of visual changes or sore throat.  Respiratory does not complain of shortness of breath.  Cardiac does not complain of chest pain.  GI is not complaining of abdominal discomfort nausea or  vomiting diarrhea or constipation.  GU does not specifically complaining of dysuria.  Musculoskeletal does complain of weakness has been leaning to the left does not complain of acute joint pain however.  Neurologic not complaining of dizziness or headache or numbness.  Does appear to have some weakness staff feels she's had some slurring of speech is well.  Psych appears somewhat weak but grossly oriented pleasant and appropriate   Immunization History  Administered Date(s) Administered  . PPD Test 01/13/2016   Pertinent  Health Maintenance Due  Topic Date Due  . DEXA SCAN  01/29/2017 (Originally 11/28/1997)  . PNA vac Low Risk Adult (1 of 2 - PCV13) 02/14/2017 (Originally 11/28/1997)  . INFLUENZA VACCINE  04/17/2016   No flowsheet data found. Functional Status Survey:    Filed Vitals:   01/30/16 0920  BP: 140/70  Pulse: 92  Temp: 97.8 F (36.6 C)  TempSrc: Oral  Resp: 20  Height: 5\' 3"  (1.6 m)  Weight: 240 lb (108.863 kg)   Body mass index is 42.52 kg/(m^2). Physical Exam   In general this is a frail elderly female currently in no distress but appears to be somewhat weak-she is leaning somewhat to the left.  Her skin is warm and dry do not really see a rash in her.  Eyes pupils appear reactive to light visual acuity appears grossly intact.  Oropharynx mucous membranes are fairly moist throat is clear.  Chest is clear to auscultation there is no labored breathing.  Heart is regular rate and rhythm with occasional irregular beats a with say she has 1+ lower extremity edema area  Abdomen soft obese soft nontender with positive bowel sounds.  Distal skeletal is able to move all extremities 4 grip strength appears preserved bilaterally she is able to move her lower legs although has some weakness although this is not appear grossly changed from her baseline according in nursing staff she is leaning somewhat to the left.  Neurologic again she is leaning somewhat to  left her speech appears to be fairly clear although apparently has some slurring at times appeared to sclera bit on reexamination a few minutes after initial exam.  Psych she is grossly alert responsive responds to commands appropriately.  Labs.  12/28/2015.  WBC 15.6 hemoglobin 13.2 platelets 156.  Sodium 145 potassium 4.8 BUN 17 creatinine 0.65.  Liver function tests within normal limits.         Assessment/Plan #1 altered mental status with hallucinations-leaning to the left-possible slurring of speech-one would be  concerned possibly a neurologic event-clinically she appears relatively stable but she is changed according to nursing who I spoke with as well as with the therapist who was in the room as well-will send her to the ER for expedient evaluation  [place], [place], [place]

## 2016-01-30 NOTE — H&P (Addendum)
TRH H&P   Patient Demographics:    Sharon Velasquez, is a 82 y.o. female  MRN: [id number]   DOB - 07/26/1931  Admit Date - 01/30/2016  Outpatient Primary MD for the patient is No primary care provider on file.  Referring MD/NP/PA: Dr Lenor Derrick  Patient coming from: SNF  Chief Complaint  Patient presents with  . Aphasia      HPI:    Sharon Velasquez  is a 82 y.o. 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 In ED workup was significant for positive urinalysis,CT head with no acute findings, hypothermia, and hyponatremia, borderline troponin at 0.04, she denies any chest pain or shortness of breath, EKG nonacute, with prolonged QTC. I was called to admit    Review of systems:    In addition to the HPI above, No Fever-chills, No Headache, No changes with Vision or hearing, No problems swallowing food or Liquids, No Chest pain, Cough or Shortness of Breath, No Abdominal pain, No Nausea or Vommitting, Bowel movements are regular, No Blood in stool or Urine, No dysuria, reports skin rash, improving No new joints pains-aches,  Report generalized weakness, but nothing focal, with speech difficulty at facility, but nothing here. No recent weight gain or loss, No polyuria, polydypsia or polyphagia, No significant Mental Stressors.  A full 10 point Review of Systems was done, except as stated above, all other Review of  Systems were negative.   With Past History of the following :    Past Medical History  Diagnosis Date  . Hypertension   . GERD (gastroesophageal reflux disease)   . Cancer (HCC)     breast  . Seasonal allergies       History reviewed. No pertinent past surgical history.    Social History:     Social History  Substance Use Topics  . Smoking status: Never Smoker   . Smokeless tobacco: Not on file  . Alcohol Use: No     Lives - at SNF  Mobility - needs assistance/has debility     Family History :    History reviewed. No pertinent family history.    Home Medications:   Prior to Admission medications   Medication Sig Start Date End Date Taking? Authorizing Provider  aspirin 81 MG chewable tablet Chew 81 mg by mouth daily.    Yes Historical Provider, MD  bisacodyl (DULCOLAX) 10 MG suppository Place 10 mg rectally as needed for moderate constipation (if not relieved by Milk Of Magnesia).  Yes Historical Provider, MD  Calcium Carbonate-Vitamin D 600-200 MG-UNIT CAPS Take 1 tablet by mouth once daily   Yes Historical Provider, MD  exemestane (AROMASIN) 25 MG tablet Take 25 mg by mouth daily.    Yes Historical Provider, MD  ferrous sulfate 325 (65 FE) MG tablet Take 325 mg by mouth daily with breakfast.    Yes Historical Provider, MD  folic acid (FOLVITE) 1 MG tablet Take 1 mg by mouth daily.    Yes Historical Provider, MD  Garlic 123XX123 MG TABS Take 100 mg by mouth daily. Give 100 mg by mouth daily   Yes Historical Provider, MD  hydrOXYzine (ATARAX/VISTARIL) 25 MG tablet Take 25 mg by mouth 3 (three) times daily.  12/29/15  Yes Historical Provider, MD  hydrOXYzine (ATARAX/VISTARIL) 50 MG tablet Take 50 mg by mouth at bedtime.   Yes Historical Provider, MD  ibuprofen (ADVIL,MOTRIN) 200 MG tablet Take 200 mg by mouth daily as needed (pain).    Yes Historical Provider, MD  lactobacillus acidophilus (BACID) TABS tablet Take 2 tablets by mouth daily.   Yes Historical Provider, MD    loperamide (IMODIUM) 2 MG capsule Take 2 mg by mouth as needed for diarrhea or loose stools.   Yes Historical Provider, MD  loratadine (CLARITIN) 10 MG tablet Take 10 mg by mouth daily.    Yes Historical Provider, MD  magnesium hydroxide (MILK OF MAGNESIA) 800 MG/5ML suspension Take 5 mLs by mouth daily as needed for constipation.   Yes Historical Provider, MD  Multiple Vitamins-Minerals (MULTIVITAMIN WITH MINERALS) tablet Take 1 tablet by mouth daily.   Yes Historical Provider, MD  Omega-3 Fatty Acids (FISH OIL) 1000 MG CAPS Take 1,200 mg by mouth daily.    Yes Historical Provider, MD  pantoprazole (PROTONIX) 40 MG tablet Take 40 mg by mouth daily.   Yes Historical Provider, MD  Sodium Phosphates (RA SALINE ENEMA RE) Place 1 each rectally daily as needed (constipation not relieved by Milk of Magnesium or Bisacodyl Suppository).   Yes Historical Provider, MD  Vitamin D, Cholecalciferol, 400 units CAPS Give 1 capsule by mouth daily   Yes Historical Provider, MD     Allergies:     Allergies  Allergen Reactions  . Latex   . 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 [facility]     Physical Exam:   Vitals  Blood pressure 102/52, pulse 85, temperature 93.1 F (33.9 C), temperature source Rectal, resp. rate 17, SpO2 96 %.   1. General this female lying in bed in NAD,    2. Normal affect and insight, Not Suicidal or Homicidal, Awake Alert, Oriented X 3.  3. No F.N deficits, ALL C.Nerves Intact, motor stress grossly intact, Sensation intact all 4 extremities, Plantars down going.  4. Ears and Eyes appear Normal, Conjunctivae clear, PERRLA. And dryOral Mucosa.  5. Supple Neck, No JVD, No cervical lymphadenopathy appriciated, No Carotid Bruits.  6. Symmetrical Chest wall movement, Good air movement bilaterally, CTAB.  7. RRR, No Gallops, Rubs or Murmurs, No Parasternal Heave.  8. Positive Bowel Sounds, Abdomen Soft, No tenderness, No organomegaly appriciated,No rebound -guarding or  rigidity.  9.  No Cyanosis, Normal Skin Turgor, patient with generalized mild  erythema  Of skin, and minimalexfoliation.  10. Good muscle tone, , no effusions, + lower ext edema.       Data Review:    CBC  Recent Labs Lab 01/30/16 1438 01/30/16 1449  WBC  --  7.2  HGB  15.3* 12.9  HCT 45.0 40.3  PLT  --  148*  MCV  --  85.2  MCH  --  27.3  MCHC  --  32.0  RDW  --  18.9*  LYMPHSABS  --  1.9  MONOABS  --  0.7  EOSABS  --  0.2  BASOSABS  --  0.0   ------------------------------------------------------------------------------------------------------------------  Chemistries   Recent Labs Lab 01/30/16 1438 01/30/16 1449  NA 152* 151*  K 4.1 4.3  CL 114* 115*  CO2  --  24  GLUCOSE 70 77  BUN 27* 24*  CREATININE 1.20* 1.18*  CALCIUM  --  9.1  AST  --  80*  ALT  --  77*  ALKPHOS  --  166*  BILITOT  --  0.4   ------------------------------------------------------------------------------------------------------------------ estimated creatinine clearance is 42.8 mL/min (by C-G formula based on Cr of 1.18). ------------------------------------------------------------------------------------------------------------------ No results for input(s): TSH, T4TOTAL, T3FREE, THYROIDAB in the last 72 hours.  Invalid input(s): FREET3  Coagulation profile No results for input(s): INR, PROTIME in the last 168 hours. ------------------------------------------------------------------------------------------------------------------- No results for input(s): DDIMER in the last 72 hours. -------------------------------------------------------------------------------------------------------------------  Cardiac Enzymes  Recent Labs Lab 01/30/16 1449  TROPONINI 0.04*   ------------------------------------------------------------------------------------------------------------------ No results found for:  BNP   ---------------------------------------------------------------------------------------------------------------  Urinalysis    Component Value Date/Time   COLORURINE YELLOW 01/30/2016 1359   APPEARANCEUR CLOUDY* 01/30/2016 1359   LABSPEC 1.023 01/30/2016 1359   PHURINE 5.0 01/30/2016 1359   GLUCOSEU NEGATIVE 01/30/2016 1359   HGBUR NEGATIVE 01/30/2016 1359   BILIRUBINUR SMALL* 01/30/2016 1359   KETONESUR 15* 01/30/2016 1359   PROTEINUR NEGATIVE 01/30/2016 1359   NITRITE NEGATIVE 01/30/2016 1359   LEUKOCYTESUR SMALL* 01/30/2016 1359    ----------------------------------------------------------------------------------------------------------------   Imaging Results:    Dg Chest 2 View  01/30/2016  CLINICAL DATA:  Slurred speech and hallucinations. EXAM: CHEST  2 VIEW COMPARISON:  12/27/2015 FINDINGS: The heart size and mediastinal contours are within normal limits. Stable bilateral pulmonary scarring. Lung volumes are low with bibasilar atelectasis present. There is no evidence of pulmonary edema, consolidation, pneumothorax, nodule or pleural fluid. The visualized skeletal structures are unremarkable. IMPRESSION: Low lung volumes with bibasilar atelectasis. Electronically Signed   By: Aletta Edouard M.D.   On: 01/30/2016 15:08   Ct Head Wo Contrast  01/30/2016  CLINICAL DATA:  Slurred speech and hallucinations since this morning. EXAM: CT HEAD WITHOUT CONTRAST TECHNIQUE: Contiguous axial images were obtained from the base of the skull through the vertex without intravenous contrast. COMPARISON:  None. FINDINGS: Brain: No evidence of acute infarction, hemorrhage, or mass effect. There is atrophy and chronic small vessel disease changes. There is prominent encephalomalacia of the left cerebellar hemisphere. Vascular: Vascular calcifications at the skullbase. Skull: Negative for fracture or focal lesion. Sinuses/Orbits: No acute findings. Other: None. IMPRESSION: No evidence of acute  infarction or intracranial hemorrhage. Atrophy, chronic microvascular disease. 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 Electronically Signed   By: Fidela Salisbury M.D.   On: 01/30/2016 15:19    My personal review of EKG: Rhythm NSR, Rate  83 /min, QTc 528 , no Acute ST changes   Assessment & Plan:    Active Problems:   Hypertension   Malignant neoplasm of female breast ([place])   Erythematous condition   GERD (gastroesophageal reflux disease)   Cancer (HCC)   UTI (lower urinary tract infection)   Hypernatremia  acute encephalopathy - patient presents with altered mental status, confusion, slurred speech,  no focal neurological deficits, CT head with no acute finding, this is most likely metabolic encephalopathy in the setting of UTI and hypernatremia.  Hypernatremia - in the setting of dehydration and volume depletion, continue with D5 half-normal saline, check BMP in a.m.  UTI - Continue with Rocephin, follow on urine culture  Hypothermia - Most likely due to infection, but patient was on large dose steroids recently for questionable Stevens-Johnson, check random cortisol, will check cortisol a.m. Level, continue with warming blankets  GERD - Continue with PPI  Hypertension - She is not taking any medications at home, actually blood pressure on the lower side, continue to monitor  Exfoliative dermatitis - Seen by dermatology at [facility], who was discharged on steroid taper,  Appears to be improving  Bilateral breast cancer - Continue with Aromasin  Mild protein calorie malnutrition - albumin 2.7, will start on ensure  Prolonged QTC - on  Telemetry, avoid prolonging medication, recheck EKG in a.m. - patient with borderline troponins,  EKG is nonacute, denies any chest pain or shortness of breath, will cycle cardiac enzymes and monitor on telemetry  DVT Prophylaxis Heparin - SCDs  AM Labs Ordered, also please  review Full Orders  Family Communication: Admission, patients condition and plan of care including tests being ordered have been discussed with the patient and daughter who indicate understanding and agree with the plan and Code Status.  Code Status Full, does not wish to be kept on life support if poor prognosis  Likely DC to back to SNF  Condition GUARDED   Consults called: none  Admission status: inpatient  Time spent in minutes : 60 minutes   Vivian Neuwirth M.D on 01/30/2016 at 5:21 PM  Between 7am to 7pm - Pager - [phone]. After 7pm go to [email or web address] - password [facility]  [facility] - Office  [phone]

## 2016-01-30 NOTE — ED Notes (Signed)
Attempted report 

## 2016-01-30 NOTE — ED Provider Notes (Signed)
CSN: [id number]     Arrival date & time 01/30/16  74 History   First MD Initiated Contact with Patient 01/30/16 1328     Chief Complaint  Patient presents with  . Aphasia     (Consider location/radiation/quality/duration/timing/severity/associated sxs/prior Treatment) HPI Comments: Patient is brought by [facility] from [facility] facility. Her daughter states that she's had confusion for the last 2 days as well as slurred speech for the last 2 days. She hasn't had any reported fevers no nausea or vomiting. No cough or congestion. She has a history of breast cancer for which she is on oral chemotherapy. She also has morbid obesity and is bedridden. She was recently admitted to [facility] hospital for an atypical Stevens-Johnson's rash. She was also admitted for sepsis she met surgical criteria on admission but this was felt by discharge to not represent sepsis and antibiotics were discontinued.   Past Medical History  Diagnosis Date  . Hypertension   . GERD (gastroesophageal reflux disease)   . Cancer (HCC)     breast  . Seasonal allergies    History reviewed. No pertinent past surgical history. History reviewed. No pertinent family history. Social History  Substance Use Topics  . Smoking status: Never Smoker   . Smokeless tobacco: None  . Alcohol Use: No   OB History    No data available     Review of Systems  Constitutional: Positive for fatigue. Negative for fever, chills and diaphoresis.  HENT: Negative for congestion, rhinorrhea and sneezing.   Eyes: Negative.   Respiratory: Negative for cough, chest tightness and shortness of breath.   Cardiovascular: Positive for leg swelling. Negative for chest pain.  Gastrointestinal: Negative for nausea, vomiting, abdominal pain, diarrhea and blood in stool.  Genitourinary: Negative for frequency, hematuria, flank pain and difficulty urinating.  Musculoskeletal: Negative for back pain and arthralgias.   Skin: Negative for rash.  Neurological: Positive for speech difficulty. Negative for dizziness, weakness, numbness and headaches.  Psychiatric/Behavioral: Positive for confusion.      Allergies  Latex and Other  Home Medications   Prior to Admission medications   Medication Sig Start Date End Date Taking? Authorizing Provider  aspirin 81 MG chewable tablet Chew 81 mg by mouth daily.    Yes Historical Provider, MD  bisacodyl (DULCOLAX) 10 MG suppository Place 10 mg rectally as needed for moderate constipation (if not relieved by Milk Of Magnesia).   Yes Historical Provider, MD  Calcium Carbonate-Vitamin D 600-200 MG-UNIT CAPS Take 1 tablet by mouth once daily   Yes Historical Provider, MD  exemestane (AROMASIN) 25 MG tablet Take 25 mg by mouth daily.    Yes Historical Provider, MD  ferrous sulfate 325 (65 FE) MG tablet Take 325 mg by mouth daily with breakfast.    Yes Historical Provider, MD  folic acid (FOLVITE) 1 MG tablet Take 1 mg by mouth daily.    Yes Historical Provider, MD  Garlic 962 MG TABS Take 100 mg by mouth daily. Give 100 mg by mouth daily   Yes Historical Provider, MD  hydrOXYzine (ATARAX/VISTARIL) 25 MG tablet Take 25 mg by mouth 3 (three) times daily.  12/29/15  Yes Historical Provider, MD  hydrOXYzine (ATARAX/VISTARIL) 50 MG tablet Take 50 mg by mouth at bedtime.   Yes Historical Provider, MD  ibuprofen (ADVIL,MOTRIN) 200 MG tablet Take 200 mg by mouth daily as needed (pain).    Yes Historical Provider, MD  lactobacillus acidophilus (BACID) TABS tablet Take 2  tablets by mouth daily.   Yes Historical Provider, MD  loperamide (IMODIUM) 2 MG capsule Take 2 mg by mouth as needed for diarrhea or loose stools.   Yes Historical Provider, MD  loratadine (CLARITIN) 10 MG tablet Take 10 mg by mouth daily.    Yes Historical Provider, MD  magnesium hydroxide (MILK OF MAGNESIA) 800 MG/5ML suspension Take 5 mLs by mouth daily as needed for constipation.   Yes Historical Provider, MD   Multiple Vitamins-Minerals (MULTIVITAMIN WITH MINERALS) tablet Take 1 tablet by mouth daily.   Yes Historical Provider, MD  Omega-3 Fatty Acids (FISH OIL) 1000 MG CAPS Take 1,200 mg by mouth daily.    Yes Historical Provider, MD  pantoprazole (PROTONIX) 40 MG tablet Take 40 mg by mouth daily.   Yes Historical Provider, MD  Sodium Phosphates (RA SALINE ENEMA RE) Place 1 each rectally daily as needed (constipation not relieved by Milk of Magnesium or Bisacodyl Suppository).   Yes Historical Provider, MD  Vitamin D, Cholecalciferol, 400 units CAPS Give 1 capsule by mouth daily   Yes Historical Provider, MD   BP 108/62 mmHg  Pulse 80  Temp(Src) 92.2 F (33.4 C) (Rectal)  Resp 18  SpO2 99% Physical Exam  Constitutional: She is oriented to person, place, and time. She appears well-developed and well-nourished.  obese  HENT:  Head: Normocephalic and atraumatic.  Eyes: Pupils are equal, round, and reactive to light.  Neck: Normal range of motion. Neck supple.  Cardiovascular: Normal rate, regular rhythm and normal heart sounds.   Pulmonary/Chest: Effort normal and breath sounds normal. No respiratory distress. She has no wheezes. She has no rales. She exhibits no tenderness.  Abdominal: Soft. Bowel sounds are normal. There is no tenderness. There is no rebound and no guarding.  Musculoskeletal: Normal range of motion. She exhibits edema.  Marked edema to lower extremities, left greater than right  Lymphadenopathy:    She has no cervical adenopathy.  Neurological: She is alert and oriented to person, place, and time.  Generalized weaknss.  Seems to be symmetric and similar in all extremities.  Sensation grossly intact to LT all extremities.  +facial droop which daughter says is similar to baseline.  Alert and oriented to person place and month/year  Skin: Skin is warm and dry. No rash noted.  Erythematous scaly skin changes, diffuse  Psychiatric: She has a normal mood and affect.    ED  Course  Procedures (including critical care time) Labs Review Results for orders placed or performed during the hospital encounter of 01/30/16  CBC with Differential  Result Value Ref Range   WBC 7.2 4.0 - 10.5 K/uL   RBC 4.73 3.87 - 5.11 MIL/uL   Hemoglobin 12.9 12.0 - 15.0 g/dL   HCT 40.3 36.0 - 46.0 %   MCV 85.2 78.0 - 100.0 fL   MCH 27.3 26.0 - 34.0 pg   MCHC 32.0 30.0 - 36.0 g/dL   RDW 18.9 (H) 11.5 - 15.5 %   Platelets 148 (L) 150 - 400 K/uL   Neutrophils Relative % 62 %   Neutro Abs 4.4 1.7 - 7.7 K/uL   Lymphocytes Relative 26 %   Lymphs Abs 1.9 0.7 - 4.0 K/uL   Monocytes Relative 10 %   Monocytes Absolute 0.7 0.1 - 1.0 K/uL   Eosinophils Relative 2 %   Eosinophils Absolute 0.2 0.0 - 0.7 K/uL   Basophils Relative 0 %   Basophils Absolute 0.0 0.0 - 0.1 K/uL  Urinalysis, Routine w reflex  microscopic  Result Value Ref Range   Color, Urine YELLOW YELLOW   APPearance CLOUDY (A) CLEAR   Specific Gravity, Urine 1.023 1.005 - 1.030   pH 5.0 5.0 - 8.0   Glucose, UA NEGATIVE NEGATIVE mg/dL   Hgb urine dipstick NEGATIVE NEGATIVE   Bilirubin Urine SMALL (A) NEGATIVE   Ketones, ur 15 (A) NEGATIVE mg/dL   Protein, ur NEGATIVE NEGATIVE mg/dL   Nitrite NEGATIVE NEGATIVE   Leukocytes, UA SMALL (A) NEGATIVE  Urine microscopic-add on  Result Value Ref Range   Squamous Epithelial / LPF 0-5 (A) NONE SEEN   WBC, UA 6-30 0 - 5 WBC/hpf   RBC / HPF 0-5 0 - 5 RBC/hpf   Bacteria, UA MANY (A) NONE SEEN  CBG monitoring, ED  Result Value Ref Range   Glucose-Capillary 64 (L) 65 - 99 mg/dL  I-stat Chem 8, ED  Result Value Ref Range   Sodium 152 (H) 135 - 145 mmol/L   Potassium 4.1 3.5 - 5.1 mmol/L   Chloride 114 (H) 101 - 111 mmol/L   BUN 27 (H) 6 - 20 mg/dL   Creatinine, Ser 1.20 (H) 0.44 - 1.00 mg/dL   Glucose, Bld 70 65 - 99 mg/dL   Calcium, Ion 1.18 1.13 - 1.30 mmol/L   TCO2 25 0 - 100 mmol/L   Hemoglobin 15.3 (H) 12.0 - 15.0 g/dL   HCT 45.0 36.0 - 46.0 %  I-Stat CG4 Lactic  Acid, ED  Result Value Ref Range   Lactic Acid, Venous 1.47 0.5 - 2.0 mmol/L  CBG monitoring, ED  Result Value Ref Range   Glucose-Capillary 67 65 - 99 mg/dL   Dg Chest 2 View  01/30/2016  CLINICAL DATA:  Slurred speech and hallucinations. EXAM: CHEST  2 VIEW COMPARISON:  12/27/2015 FINDINGS: The heart size and mediastinal contours are within normal limits. Stable bilateral pulmonary scarring. Lung volumes are low with bibasilar atelectasis present. There is no evidence of pulmonary edema, consolidation, pneumothorax, nodule or pleural fluid. The visualized skeletal structures are unremarkable. IMPRESSION: Low lung volumes with bibasilar atelectasis. Electronically Signed   By: Aletta Edouard M.D.   On: 01/30/2016 15:08   Ct Head Wo Contrast  01/30/2016  CLINICAL DATA:  Slurred speech and hallucinations since this morning. EXAM: CT HEAD WITHOUT CONTRAST TECHNIQUE: Contiguous axial images were obtained from the base of the skull through the vertex without intravenous contrast. COMPARISON:  None. FINDINGS: Brain: No evidence of acute infarction, hemorrhage, or mass effect. There is atrophy and chronic small vessel disease changes. There is prominent encephalomalacia of the left cerebellar hemisphere. Vascular: Vascular calcifications at the skullbase. Skull: Negative for fracture or focal lesion. Sinuses/Orbits: No acute findings. Other: None. IMPRESSION: No evidence of acute infarction or intracranial hemorrhage. Atrophy, chronic microvascular disease. 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 Electronically Signed   By: Fidela Salisbury M.D.   On: 01/30/2016 15:19      Imaging Review Dg Chest 2 View  01/30/2016  CLINICAL DATA:  Slurred speech and hallucinations. EXAM: CHEST  2 VIEW COMPARISON:  12/27/2015 FINDINGS: The heart size and mediastinal contours are within normal limits. Stable bilateral pulmonary scarring. Lung volumes are low with bibasilar  atelectasis present. There is no evidence of pulmonary edema, consolidation, pneumothorax, nodule or pleural fluid. The visualized skeletal structures are unremarkable. IMPRESSION: Low lung volumes with bibasilar atelectasis. Electronically Signed   By: Aletta Edouard M.D.   On: 01/30/2016 15:08   Ct Head Wo Contrast  01/30/2016  CLINICAL DATA:  Slurred speech and hallucinations since this morning. EXAM: CT HEAD WITHOUT CONTRAST TECHNIQUE: Contiguous axial images were obtained from the base of the skull through the vertex without intravenous contrast. COMPARISON:  None. FINDINGS: Brain: No evidence of acute infarction, hemorrhage, or mass effect. There is atrophy and chronic small vessel disease changes. There is prominent encephalomalacia of the left cerebellar hemisphere. Vascular: Vascular calcifications at the skullbase. Skull: Negative for fracture or focal lesion. Sinuses/Orbits: No acute findings. Other: None. IMPRESSION: No evidence of acute infarction or intracranial hemorrhage. Atrophy, chronic microvascular disease. 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 Electronically Signed   By: Fidela Salisbury M.D.   On: 01/30/2016 15:19   I have personally reviewed and evaluated these images and lab results as part of my medical decision-making.   EKG Interpretation   Date/Time:  Monday Jan 30 2016 13:24:51 EDT Ventricular Rate:  80 PR Interval:  167 QRS Duration: 107 QT Interval:  458 QTC Calculation: 528 R Axis:   -9 Text Interpretation:  Sinus rhythm Probable left ventricular hypertrophy  Prolonged QT interval No old tracing to compare Confirmed by [place]  MD,  [place] ([id number]) on 01/30/2016 1:37:09 PM      MDM   Final diagnoses:  Hypothermia, initial encounter  UTI (lower urinary tract infection)    Patient presents from a skilled nursing facility with altered mental status and possible slurred speech. There is no obvious new focal  neurologic deficits. Her head CT is negative. She is markedly hypothermic with a core temperature of 92.2. However her lactate is normal and she has no other surgical criteria for sepsis. Her chest history is negative for infiltrate or edema. She does have evidence of a UTI. Was sent for culture. Blood cultures were done as well. She was given a dose of IV Rocephin. She was placed on the [facility]. I will consult hospitalist for admission.  Discussed with hospitalist who will admit pt to med surg bed, full admission  Malvin Johns, MD 01/30/16 1644

## 2016-01-30 NOTE — ED Notes (Signed)
[facility]- pt coming from [facility] with c/o slurred speech. Pt also had hallucinations this morning. Pt is alert and oriented X4 but slurred speech noted. LSN was bed time last night.

## 2016-01-30 NOTE — ED Notes (Signed)
Kyra Searles (daughter)  [phone]

## 2016-01-30 NOTE — ED Notes (Signed)
Patient placed on bear hugger for rectal temperature of 92.2

## 2016-01-31 ENCOUNTER — Inpatient Hospital Stay (HOSPITAL_COMMUNITY): Payer: Medicare Other

## 2016-01-31 DIAGNOSIS — L26 Exfoliative dermatitis: Secondary | ICD-10-CM

## 2016-01-31 DIAGNOSIS — G9340 Encephalopathy, unspecified: Secondary | ICD-10-CM | POA: Diagnosis present

## 2016-01-31 DIAGNOSIS — A4901 Methicillin susceptible Staphylococcus aureus infection, unspecified site: Secondary | ICD-10-CM

## 2016-01-31 DIAGNOSIS — Z853 Personal history of malignant neoplasm of breast: Secondary | ICD-10-CM

## 2016-01-31 LAB — CBC WITH DIFFERENTIAL/PLATELET
BASOS ABS: 0.1 10*3/uL (ref 0.0–0.1)
Basophils Relative: 1 %
EOS ABS: 0.1 10*3/uL (ref 0.0–0.7)
Eosinophils Relative: 1 %
HEMATOCRIT: 33.6 % — AB (ref 36.0–46.0)
Hemoglobin: 10.8 g/dL — ABNORMAL LOW (ref 12.0–15.0)
LYMPHS ABS: 1.8 10*3/uL (ref 0.7–4.0)
LYMPHS PCT: 31 %
MCH: 27.9 pg (ref 26.0–34.0)
MCHC: 32.1 g/dL (ref 30.0–36.0)
MCV: 86.8 fL (ref 78.0–100.0)
MONOS PCT: 10 %
Monocytes Absolute: 0.6 10*3/uL (ref 0.1–1.0)
Neutro Abs: 3.3 10*3/uL (ref 1.7–7.7)
Neutrophils Relative %: 57 %
Platelets: 122 10*3/uL — ABNORMAL LOW (ref 150–400)
RBC: 3.87 MIL/uL (ref 3.87–5.11)
RDW: 19.5 % — AB (ref 11.5–15.5)
WBC: 5.9 10*3/uL (ref 4.0–10.5)

## 2016-01-31 LAB — BLOOD CULTURE ID PANEL (REFLEXED)
ACINETOBACTER BAUMANNII: NOT DETECTED
CANDIDA GLABRATA: NOT DETECTED
CARBAPENEM RESISTANCE: NOT DETECTED
Candida albicans: NOT DETECTED
Candida krusei: NOT DETECTED
Candida parapsilosis: NOT DETECTED
Candida tropicalis: NOT DETECTED
ENTEROBACTERIACEAE SPECIES: NOT DETECTED
ESCHERICHIA COLI: NOT DETECTED
Enterobacter cloacae complex: NOT DETECTED
Enterococcus species: NOT DETECTED
HAEMOPHILUS INFLUENZAE: NOT DETECTED
Klebsiella oxytoca: NOT DETECTED
Klebsiella pneumoniae: NOT DETECTED
LISTERIA MONOCYTOGENES: NOT DETECTED
METHICILLIN RESISTANCE: NOT DETECTED
NEISSERIA MENINGITIDIS: NOT DETECTED
Proteus species: NOT DETECTED
Pseudomonas aeruginosa: NOT DETECTED
SERRATIA MARCESCENS: NOT DETECTED
STAPHYLOCOCCUS SPECIES: NOT DETECTED
STREPTOCOCCUS PYOGENES: NOT DETECTED
STREPTOCOCCUS SPECIES: NOT DETECTED
Staphylococcus aureus (BCID): DETECTED — AB
Streptococcus agalactiae: NOT DETECTED
Streptococcus pneumoniae: NOT DETECTED
Vancomycin resistance: NOT DETECTED

## 2016-01-31 LAB — BASIC METABOLIC PANEL
Anion gap: 17 — ABNORMAL HIGH (ref 5–15)
Anion gap: 10 (ref 5–15)
BUN: 24 mg/dL — ABNORMAL HIGH (ref 6–20)
BUN: 23 mg/dL — AB (ref 6–20)
CALCIUM: 8 mg/dL — AB (ref 8.9–10.3)
CHLORIDE: 124 mmol/L — AB (ref 101–111)
CO2: 14 mmol/L — ABNORMAL LOW (ref 22–32)
CO2: 15 mmol/L — AB (ref 22–32)
CREATININE: 1.23 mg/dL — AB (ref 0.44–1.00)
CREATININE: 1.07 mg/dL — AB (ref 0.44–1.00)
Calcium: 7.6 mg/dL — ABNORMAL LOW (ref 8.9–10.3)
Chloride: 122 mmol/L — ABNORMAL HIGH (ref 101–111)
GFR calc Af Amer: 54 mL/min — ABNORMAL LOW (ref >60)
GFR calc non Af Amer: 47 mL/min — ABNORMAL LOW (ref >60)
GFR, EST AFRICAN AMERICAN: 46 mL/min — AB (ref >60)
GFR, EST NON AFRICAN AMERICAN: 39 mL/min — AB (ref >60)
Glucose, Bld: 47 mg/dL — ABNORMAL LOW (ref 65–99)
Glucose, Bld: 58 mg/dL — ABNORMAL LOW (ref 65–99)
Potassium: 7 mmol/L (ref 3.5–5.1)
Potassium: 5 mmol/L (ref 3.5–5.1)
SODIUM: 153 mmol/L — AB (ref 135–145)
Sodium: 149 mmol/L — ABNORMAL HIGH (ref 135–145)

## 2016-01-31 LAB — URINE CULTURE: SPECIAL REQUESTS: NORMAL

## 2016-01-31 LAB — TSH: TSH: 4.644 u[IU]/mL — AB (ref 0.350–4.500)

## 2016-01-31 LAB — SODIUM, URINE, RANDOM: Sodium, Ur: 107 mmol/L

## 2016-01-31 LAB — CORTISOL: Cortisol, Plasma: 17.2 ug/dL

## 2016-01-31 LAB — GLUCOSE, CAPILLARY
Glucose-Capillary: 75 mg/dL (ref 65–99)
Glucose-Capillary: 62 mg/dL — ABNORMAL LOW (ref 65–99)

## 2016-01-31 LAB — C-REACTIVE PROTEIN: CRP: 13.4 mg/dL — ABNORMAL HIGH (ref <1.0)

## 2016-01-31 LAB — OSMOLALITY, URINE: Osmolality, Ur: 649 mOsm/kg (ref 300–900)

## 2016-01-31 LAB — TROPONIN I: TROPONIN I: 0.03 ng/mL (ref <0.031)

## 2016-01-31 LAB — LACTIC ACID, PLASMA: Lactic Acid, Venous: 1.2 mmol/L (ref 0.5–2.0)

## 2016-01-31 LAB — OSMOLALITY: Osmolality: 320 mOsm/kg — ABNORMAL HIGH (ref 275–295)

## 2016-01-31 LAB — SEDIMENTATION RATE: SED RATE: 60 mm/h — AB (ref 0–22)

## 2016-01-31 MED ORDER — DEXTROSE 5 % IV SOLN
INTRAVENOUS | Status: DC
Start: 2016-01-31 — End: 2016-02-02
  Administered 2016-01-31 – 2016-02-01 (×2): via INTRAVENOUS

## 2016-01-31 MED ORDER — SODIUM CHLORIDE 0.9% FLUSH
10.0000 mL | Freq: Two times a day (BID) | INTRAVENOUS | Status: DC
  Administered 2016-01-31: 20 mL
  Administered 2016-02-01 – 2016-02-03 (×4): 10 mL

## 2016-01-31 MED ORDER — RESOURCE THICKENUP CLEAR PO POWD
ORAL | Status: DC | PRN
  Filled 2016-01-31: qty 125

## 2016-01-31 MED ORDER — SODIUM CHLORIDE 0.9 % IV BOLUS (SEPSIS)
500.0000 mL | Freq: Once | INTRAVENOUS | Status: AC
  Administered 2016-01-31: 500 mL via INTRAVENOUS

## 2016-01-31 MED ORDER — CEFAZOLIN SODIUM-DEXTROSE 2-4 GM/100ML-% IV SOLN
2.0000 g | Freq: Three times a day (TID) | INTRAVENOUS | Status: DC
  Administered 2016-01-31 – 2016-02-06 (×18): 2 g via INTRAVENOUS
  Filled 2016-01-31 (×24): qty 100

## 2016-01-31 MED ORDER — SODIUM CHLORIDE 0.9% FLUSH
10.0000 mL | INTRAVENOUS | Status: DC | PRN
  Administered 2016-02-06: 10 mL
  Filled 2016-01-31: qty 40

## 2016-01-31 NOTE — Progress Notes (Signed)
[facility] Progress Note  Patient: Sharon Velasquez JAS:[id number]   PCP: No primary care provider on file. DOB: Jan 08, 1931   DOA: 01/30/2016   DOS: 01/31/2016   Date of Service: the patient was seen and examined on 01/31/2016  Subjective: Patient this morning denies any acute complaint. Has a waxing and waning mental status. No chest pain and abdominal pain and nausea and vomiting. No acute events identified overnight. Nutrition: Minimal oral intake.  Brief hospital course: Patient was admitted on 01/30/2016, with complaint of speech difficulty, was found to have MSSA bacteremia. Currently further plan is continue further workup for acute encephalopathy as well as MSSA bacteremia.  Assessment and Plan: 1. MSSA (methicillin susceptible Staphylococcus aureus) infection Suspected UTI. 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 Blood cultures are positive for MSSA. Antibiotic changed to cefazolin. Appreciate input from infectious disease. Repeat blood cultures ordered. ESR CRP markedly elevated.  2. Acute encephalopathy.  CT scan unremarkable. Has significant hypernatremia likely evidence of dehydration. Will check MRI of the brain. PTOT and speech evaluation. Speech evaluation recommend dysphagia type II diet. We will check ammonia level.  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 Consultation mentions that the patient may have atypical Stevens-Johnson syndrome but later on discharge summary mentions this is less likely Stevens-Johnson syndrome and therefore patient was treated with high-dose steroids and improved. As per patient of this condition is worsening again after discharge home. Given that the patient has significant evidence of infection will continue to closely monitor without any initiation of the steroids. Less likely Stevens-Johnson  syndrome therefore we will continue to monitor the patient at [facility] instead of transfer. No mucosal membrane ulceration,  Nikolsky negative  4. Essential hypertension. Holding blood pressure medication in the setting of borderline blood pressure. Cortisol normal.  5. History of breast cancer. Continue home regimen.  6. Hypothermia. Most likely the setting of infection. Continue Bair hugger  Pain management: When necessary Tylenol avoid narcotics Activity: Consulted physical therapy Bowel regimen: last BM 01/31/2016 Diet: Ceja type II DVT Prophylaxis: subcutaneous Heparin  Advance goals of care discussion: Full code prognosis guarded  Family Communication: no family was present at bedside, at the time of interview.   Disposition:  Discharge to likely SNF  Consultants: Infectious disease Procedures: none  Antibiotics: Anti-infectives    Start     Dose/Rate Route Frequency Ordered Stop   01/31/16 1500  cefTRIAXone (ROCEPHIN) 1 g in dextrose 5 % 50 mL IVPB  Status:  Discontinued     1 g 100 mL/hr over 30 Minutes Intravenous Every 24 hours 01/30/16 1853 01/31/16 0832   01/31/16 0845  ceFAZolin (ANCEF) IVPB 2g/100 mL premix     2 g 200 mL/hr over 30 Minutes Intravenous Every 8 hours 01/31/16 0832     01/30/16 1530  cefTRIAXone (ROCEPHIN) 1 g in dextrose 5 % 50 mL IVPB     1 g 100 mL/hr over 30 Minutes Intravenous  Once 01/30/16 1524 01/30/16 1634        Intake/Output Summary (Last 24 hours) at 01/31/16 2045 Last data filed at 01/31/16 1949  Gross per 24 hour  Intake 1627.5 ml  Output    150 ml  Net 1477.5 ml   Filed Weights   01/30/16 1854  Weight: 110.7 kg (244 lb 0.8 oz)    Objective: Physical Exam: Filed Vitals:   01/31/16 1136 01/31/16 1423 01/31/16 1929  01/31/16 2000  BP: 112/65 1'00/55 93/54 76/47 '$  Pulse: 81 84 86 85  Temp:  95.9 F (35.5 C) 97.7 F (36.5 C)   TempSrc:  Rectal Rectal   Resp: '17 18 15 12  '$ Height:      Weight:        SpO2: 94% 93% 95% 90%    General: Alert, Awake and Oriented to Place and Person. Appear in moderate distress Eyes: PERRL, Conjunctiva normal ENT: Oral Mucosa clear moist. Neck: difficult to assess  JVD, no Abnormal Mass Or lumps Cardiovascular: S1 and S2 Present, aortic systolic Murmur, Peripheral Pulses Present Respiratory: Bilateral Air entry equal and Decreased, Clear to Auscultation, no Crackles, no wheezes Abdomen: Bowel Sound present, Soft and no tenderness Skin: Mild upper extremity bilateral redness, evidence of dry skin with breakdown from neck to legs  Extremities: trace Pedal edema, no calf tenderness Neurologic: Grossly no focal neuro deficit. Bilaterally Equal motor strength Significant lethargy.  Data Reviewed: CBC:  Recent Labs Lab 01/30/16 1438 01/30/16 1449 01/31/16 1012  WBC  --  7.2 5.9  NEUTROABS  --  4.4 3.3  HGB 15.3* 12.9 10.8*  HCT 45.0 40.3 33.6*  MCV  --  85.2 86.8  PLT  --  148* 758*   Basic Metabolic Panel:  Recent Labs Lab 01/30/16 1438 01/30/16 1449 01/31/16 0735 01/31/16 1012  NA 152* 151* 153* 149*  K 4.1 4.3 7.0* 5.0  CL 114* 115* 122* 124*  CO2  --  24 14* 15*  GLUCOSE 70 77 47* 58*  BUN 27* 24* 24* 23*  CREATININE 1.20* 1.18* 1.23* 1.07*  CALCIUM  --  9.1 8.0* 7.6*    Liver Function Tests:  Recent Labs Lab 01/30/16 1449  AST 80*  ALT 77*  ALKPHOS 166*  BILITOT 0.4  PROT 6.6  ALBUMIN 2.7*   No results for input(s): LIPASE, AMYLASE in the last 168 hours. No results for input(s): AMMONIA in the last 168 hours. Coagulation Profile: No results for input(s): INR, PROTIME in the last 168 hours. Cardiac Enzymes:  Recent Labs Lab 01/30/16 1449 01/30/16 1921 01/31/16 0133 01/31/16 0735  TROPONINI 0.04* <0.03 <0.03 0.03   BNP (last 3 results) No results for input(s): PROBNP in the last 8760 hours.  CBG:  Recent Labs Lab 01/30/16 1319 01/30/16 1343 01/30/16 1639 01/31/16 1232 01/31/16 1929  GLUCAP 64* 67 70  75 62*    Studies: Dg Chest Port 1 View  01/31/2016  CLINICAL DATA:  G93.40 (ICD-10-CM) - Acute encephalopathy R29.810 (ICD-10-CM) - Weakness on left side of face R09.89 (ICD-10-CM) - Bibasilar crackles EXAM: PORTABLE CHEST 1 VIEW COMPARISON:  01/30/2016 FINDINGS: Cardiac silhouette is mildly enlarged. Aorta is uncoiled. No mediastinal or hilar masses or convincing adenopathy. There persistent low lung volumes with elevation of right hemidiaphragm. No convincing pneumonia or pulmonary edema. Bony thorax is demineralized but grossly intact. IMPRESSION: 1. No acute findings. No significant change from the previous day's study. Electronically Signed   By: Lajean Manes M.D.   On: 01/31/2016 11:10     Scheduled Meds: . aspirin  81 mg Oral Daily  .  ceFAZolin (ANCEF) IV  2 g Intravenous Q8H  . exemestane  25 mg Oral Daily  . feeding supplement (ENSURE ENLIVE)  237 mL Oral BID BM  . ferrous sulfate  325 mg Oral Q breakfast  . folic acid  1 mg Oral Daily  . heparin  5,000 Units Subcutaneous Q8H  . hydrOXYzine  25 mg Oral TID  .  lactobacillus acidophilus  2 tablet Oral Daily  . loratadine  10 mg Oral Daily  . multivitamin with minerals  1 tablet Oral Daily  . pantoprazole  40 mg Oral Daily  . sodium chloride flush  10-40 mL Intracatheter Q12H   Continuous Infusions: . dextrose 75 mL/hr at 01/31/16 1900   PRN Meds: bisacodyl, RESOURCE THICKENUP CLEAR, sodium chloride flush  Time spent: 30 minutes  Author: Berle Mull, MD [facility] Pager: [phone] 01/31/2016 8:45 PM  If 7PM-7AM, please contact night-coverage at [email or web address], password [facility]

## 2016-01-31 NOTE — Progress Notes (Signed)
Pt's  Axillary temp is 95.9, warming blanket placed on pt set on medium. Will continue to monitor temp. Pt oriented to self and her birthdate but disoriented to place and situation. Pt denies feeling any pain at this time and pt will follow commands. Consuelo Pandy RN

## 2016-01-31 NOTE — Consult Note (Signed)
[facility]  Total days of antibiotics 2        Day 1cefazolin               Reason for Consult: MSSA bacteremia   Referring Physician: patel  Active Problems:   Hypertension   Malignant neoplasm of female breast ([place])   Erythematous condition   GERD (gastroesophageal reflux disease)   Cancer (HCC)   UTI (lower urinary tract infection)   Hypernatremia    HPI: Sharon Velasquez is a 82 y.o. 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 She is admitted for AMS, slurred speech, Weakness and fatigue,hypothermic with no other focal deficits, at the time of exam in ED on 5/15. NCHCT negative. Concern for infectious cause, ua showed some pyuria but negative nitrites. She was started on ceftriaxone for possible UTI. In the meantime, other infectious work up showed negative CXR, but blood cx did grow 1 of 4 bottles MSSA. Repeat blood cx done on 5/16. She remains to need to have heating bearhugger.  Past Medical History  Diagnosis Date  . Hypertension   . GERD (gastroesophageal reflux disease)   . Cancer (HCC)     breast  . Seasonal allergies     Allergies:  Allergies  Allergen Reactions  . Latex   . 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 MAR     MEDICATIONS: . aspirin  81 mg Oral Daily  .  ceFAZolin (ANCEF) IV  2 g Intravenous Q8H  . exemestane  25 mg Oral Daily  . feeding supplement (ENSURE ENLIVE)  237 mL Oral BID BM  . ferrous sulfate  325 mg Oral Q breakfast  . folic acid  1 mg Oral Daily  . heparin  5,000 Units Subcutaneous Q8H  . hydrOXYzine  25 mg Oral TID  . lactobacillus acidophilus  2 tablet Oral Daily  . loratadine  10 mg Oral Daily  . multivitamin with minerals  1 tablet Oral Daily  . pantoprazole  40 mg Oral Daily    Social History  Substance Use Topics  . Smoking status: Never Smoker   . Smokeless tobacco:  None  . Alcohol Use: No    History reviewed. No pertinent family history.  Review of Systems -  Unable to obtain due to AMS  OBJECTIVE: Temp:  [92.2 F (33.4 C)-97.6 F (36.4 C)] 95.1 F (35.1 C) (05/16 1100) Pulse Rate:  [74-88] 81 (05/16 1136) Resp:  [11-22] 17 (05/16 1136) BP: (85-141)/(50-86) 112/65 mmHg (05/16 1136) SpO2:  [93 %-100 %] 94 % (05/16 1136) Weight:  [244 lb 0.8 oz (110.7 kg)] 244 lb 0.8 oz (110.7 kg) (05/15 1854) Physical Exam  Constitutional:  oriented to person only. appears chronically ill. No distress.  HENT: [place]/AT, PERRLA, no scleral icterus Mouth/Throat: Oropharynx is clear and moist. No oropharyngeal exudate.  Cardiovascular: Normal rate, regular rhythm and normal heart sounds. Exam reveals no gallop and no friction rub.  No murmur heard.  Pulmonary/Chest: Effort normal and breath sounds normal. No respiratory distress.  has no wheezes.  Neck = supple, no nuchal rigidity Abdominal: Soft. Bowel sounds are normal.  exhibits no distension. There is no tenderness.  Lymphadenopathy: no cervical adenopathy. No axillary adenopathy Neurological: moves arms spontaneously, unable to move legs per command. Muscle wasting of lower extremity Skin: Skin is warm and dry. Areas of erythema and flakiness to upper  extremities. No mucosal involvement   LABS: Results for orders placed or performed during the hospital encounter of 01/30/16 (from the past 48 hour(s))  CBG monitoring, ED     Status: Abnormal   Collection Time: 01/30/16  1:19 PM  Result Value Ref Range   Glucose-Capillary 64 (L) 65 - 99 mg/dL  CBG monitoring, ED     Status: None   Collection Time: 01/30/16  1:43 PM  Result Value Ref Range   Glucose-Capillary 67 65 - 99 mg/dL  Urine culture     Status: Abnormal   Collection Time: 01/30/16  1:59 PM  Result Value Ref Range   Specimen Description URINE, CLEAN CATCH    Special Requests Normal    Culture MULTIPLE SPECIES PRESENT, SUGGEST RECOLLECTION (A)      Report Status 01/31/2016 FINAL   Urinalysis, Routine w reflex microscopic     Status: Abnormal   Collection Time: 01/30/16  1:59 PM  Result Value Ref Range   Color, Urine YELLOW YELLOW   APPearance CLOUDY (A) CLEAR   Specific Gravity, Urine 1.023 1.005 - 1.030   pH 5.0 5.0 - 8.0   Glucose, UA NEGATIVE NEGATIVE mg/dL   Hgb urine dipstick NEGATIVE NEGATIVE   Bilirubin Urine SMALL (A) NEGATIVE   Ketones, ur 15 (A) NEGATIVE mg/dL   Protein, ur NEGATIVE NEGATIVE mg/dL   Nitrite NEGATIVE NEGATIVE   Leukocytes, UA SMALL (A) NEGATIVE  Urine microscopic-add on     Status: Abnormal   Collection Time: 01/30/16  1:59 PM  Result Value Ref Range   Squamous Epithelial / LPF 0-5 (A) NONE SEEN   WBC, UA 6-30 0 - 5 WBC/hpf   RBC / HPF 0-5 0 - 5 RBC/hpf   Bacteria, UA MANY (A) NONE SEEN  Culture, blood (routine x 2)     Status: None (Preliminary result)   Collection Time: 01/30/16  2:15 PM  Result Value Ref Range   Specimen Description BLOOD RIGHT ANTECUBITAL    Special Requests      BOTTLES DRAWN AEROBIC AND ANAEROBIC 10CC AER 5CC ANA   Culture NO GROWTH < 24 HOURS    Report Status PENDING   Culture, blood (routine x 2)     Status: None (Preliminary result)   Collection Time: 01/30/16  2:20 PM  Result Value Ref Range   Specimen Description BLOOD RIGHT HAND    Special Requests BOTTLES DRAWN AEROBIC ONLY [place]    Culture  Setup Time      GRAM POSITIVE COCCI IN CLUSTERS AEROBIC BOTTLE ONLY Organism ID to follow CRITICAL RESULT CALLED TO, READ BACK BY AND VERIFIED WITH: J. Darleen Crocker D AT 0809 ON [id number] BY Rhea Bleacher    Culture CULTURE REINCUBATED FOR BETTER GROWTH    Report Status PENDING   Blood Culture ID Panel (Reflexed)     Status: Abnormal   Collection Time: 01/30/16  2:20 PM  Result Value Ref Range   Enterococcus species NOT DETECTED NOT DETECTED   Vancomycin resistance NOT DETECTED NOT DETECTED   Listeria monocytogenes NOT DETECTED NOT DETECTED   Staphylococcus species NOT  DETECTED NOT DETECTED   Staphylococcus aureus DETECTED (A) NOT DETECTED    Comment: CRITICAL RESULT CALLED TO, READ BACK BY AND VERIFIED WITH: J. Darleen Crocker D AT 0809 ON [id number] BY S. YARBROUGH    Methicillin resistance NOT DETECTED NOT DETECTED   Streptococcus species NOT DETECTED NOT DETECTED   Streptococcus agalactiae NOT DETECTED NOT DETECTED   Streptococcus pneumoniae NOT DETECTED NOT DETECTED  Streptococcus pyogenes NOT DETECTED NOT DETECTED   Acinetobacter baumannii NOT DETECTED NOT DETECTED   Enterobacteriaceae species NOT DETECTED NOT DETECTED   Enterobacter cloacae complex NOT DETECTED NOT DETECTED   Escherichia coli NOT DETECTED NOT DETECTED   Klebsiella oxytoca NOT DETECTED NOT DETECTED   Klebsiella pneumoniae NOT DETECTED NOT DETECTED   Proteus species NOT DETECTED NOT DETECTED   Serratia marcescens NOT DETECTED NOT DETECTED   Carbapenem resistance NOT DETECTED NOT DETECTED   Haemophilus influenzae NOT DETECTED NOT DETECTED   Neisseria meningitidis NOT DETECTED NOT DETECTED   Pseudomonas aeruginosa NOT DETECTED NOT DETECTED   Candida albicans NOT DETECTED NOT DETECTED   Candida glabrata NOT DETECTED NOT DETECTED   Candida krusei NOT DETECTED NOT DETECTED   Candida parapsilosis NOT DETECTED NOT DETECTED   Candida tropicalis NOT DETECTED NOT DETECTED  I-stat Chem 8, ED     Status: Abnormal   Collection Time: 01/30/16  2:38 PM  Result Value Ref Range   Sodium 152 (H) 135 - 145 mmol/L   Potassium 4.1 3.5 - 5.1 mmol/L   Chloride 114 (H) 101 - 111 mmol/L   BUN 27 (H) 6 - 20 mg/dL   Creatinine, Ser 1.20 (H) 0.44 - 1.00 mg/dL   Glucose, Bld 70 65 - 99 mg/dL   Calcium, Ion 1.18 1.13 - 1.30 mmol/L   TCO2 25 0 - 100 mmol/L   Hemoglobin 15.3 (H) 12.0 - 15.0 g/dL   HCT 45.0 36.0 - 46.0 %  I-Stat CG4 Lactic Acid, ED     Status: None   Collection Time: 01/30/16  2:38 PM  Result Value Ref Range   Lactic Acid, Venous 1.47 0.5 - 2.0 mmol/L  Comprehensive metabolic panel      Status: Abnormal   Collection Time: 01/30/16  2:49 PM  Result Value Ref Range   Sodium 151 (H) 135 - 145 mmol/L   Potassium 4.3 3.5 - 5.1 mmol/L   Chloride 115 (H) 101 - 111 mmol/L   CO2 24 22 - 32 mmol/L   Glucose, Bld 77 65 - 99 mg/dL   BUN 24 (H) 6 - 20 mg/dL   Creatinine, Ser 1.18 (H) 0.44 - 1.00 mg/dL   Calcium 9.1 8.9 - 10.3 mg/dL   Total Protein 6.6 6.5 - 8.1 g/dL   Albumin 2.7 (L) 3.5 - 5.0 g/dL   AST 80 (H) 15 - 41 U/L   ALT 77 (H) 14 - 54 U/L   Alkaline Phosphatase 166 (H) 38 - 126 U/L   Total Bilirubin 0.4 0.3 - 1.2 mg/dL   GFR calc non Af Amer 41 (L) >60 mL/min   GFR calc Af Amer 48 (L) >60 mL/min    Comment: (NOTE) The eGFR has been calculated using the CKD EPI equation. This calculation has not been validated in all clinical situations. eGFR's persistently <60 mL/min signify possible Chronic Kidney Disease.    Anion gap 12 5 - 15  CBC with Differential     Status: Abnormal   Collection Time: 01/30/16  2:49 PM  Result Value Ref Range   WBC 7.2 4.0 - 10.5 K/uL   RBC 4.73 3.87 - 5.11 MIL/uL   Hemoglobin 12.9 12.0 - 15.0 g/dL   HCT 40.3 36.0 - 46.0 %   MCV 85.2 78.0 - 100.0 fL   MCH 27.3 26.0 - 34.0 pg   MCHC 32.0 30.0 - 36.0 g/dL   RDW 18.9 (H) 11.5 - 15.5 %   Platelets 148 (L) 150 - 400 K/uL  Neutrophils Relative % 62 %   Neutro Abs 4.4 1.7 - 7.7 K/uL   Lymphocytes Relative 26 %   Lymphs Abs 1.9 0.7 - 4.0 K/uL   Monocytes Relative 10 %   Monocytes Absolute 0.7 0.1 - 1.0 K/uL   Eosinophils Relative 2 %   Eosinophils Absolute 0.2 0.0 - 0.7 K/uL   Basophils Relative 0 %   Basophils Absolute 0.0 0.0 - 0.1 K/uL  Troponin I     Status: Abnormal   Collection Time: 01/30/16  2:49 PM  Result Value Ref Range   Troponin I 0.04 (H) <0.031 ng/mL    Comment:        PERSISTENTLY INCREASED TROPONIN VALUES IN THE RANGE OF 0.04-0.49 ng/mL CAN BE SEEN IN:       -UNSTABLE ANGINA       -CONGESTIVE HEART FAILURE       -MYOCARDITIS       -CHEST TRAUMA        -ARRYHTHMIAS       -LATE PRESENTING MYOCARDIAL INFARCTION       -COPD   CLINICAL FOLLOW-UP RECOMMENDED.   CBG monitoring, ED     Status: None   Collection Time: 01/30/16  4:39 PM  Result Value Ref Range   Glucose-Capillary 70 65 - 99 mg/dL  MRSA PCR Screening     Status: None   Collection Time: 01/30/16  6:56 PM  Result Value Ref Range   MRSA by PCR NEGATIVE NEGATIVE    Comment:        The GeneXpert MRSA Assay (FDA approved for NASAL specimens only), is one component of a comprehensive MRSA colonization surveillance program. It is not intended to diagnose MRSA infection nor to guide or monitor treatment for MRSA infections.   Cortisol     Status: None   Collection Time: 01/30/16  7:15 PM  Result Value Ref Range   Cortisol, Plasma 28.6 ug/dL    Comment: (NOTE) AM    6.7 - 22.6 ug/dL PM   <42.9       ug/dL   Troponin I     Status: None   Collection Time: 01/30/16  7:21 PM  Result Value Ref Range   Troponin I <0.03 <0.031 ng/mL    Comment:        NO INDICATION OF MYOCARDIAL INJURY.   Troponin I     Status: None   Collection Time: 01/31/16  1:33 AM  Result Value Ref Range   Troponin I <0.03 <0.031 ng/mL    Comment:        NO INDICATION OF MYOCARDIAL INJURY.   Basic metabolic panel     Status: Abnormal   Collection Time: 01/31/16  7:35 AM  Result Value Ref Range   Sodium 153 (H) 135 - 145 mmol/L   Potassium 7.0 (HH) 3.5 - 5.1 mmol/L    Comment: SLIGHT HEMOLYSIS CRITICAL RESULT CALLED TO, READ BACK BY AND VERIFIED WITH: R.MYRICK,RN 0906 01/31/16 CLARK,S    Chloride 122 (H) 101 - 111 mmol/L   CO2 14 (L) 22 - 32 mmol/L   Glucose, Bld 47 (L) 65 - 99 mg/dL   BUN 24 (H) 6 - 20 mg/dL   Creatinine, Ser 0.69 (H) 0.44 - 1.00 mg/dL   Calcium 8.0 (L) 8.9 - 10.3 mg/dL   GFR calc non Af Amer 39 (L) >60 mL/min   GFR calc Af Amer 46 (L) >60 mL/min    Comment: (NOTE) The eGFR has been calculated using the CKD EPI equation.  This calculation has not been validated in all  clinical situations. eGFR's persistently <60 mL/min signify possible Chronic Kidney Disease.    Anion gap 17 (H) 5 - 15  Troponin I     Status: None   Collection Time: 01/31/16  7:35 AM  Result Value Ref Range   Troponin I 0.03 <0.031 ng/mL    Comment:        NO INDICATION OF MYOCARDIAL INJURY.   Basic metabolic panel     Status: Abnormal   Collection Time: 01/31/16 10:12 AM  Result Value Ref Range   Sodium 149 (H) 135 - 145 mmol/L   Potassium 5.0 3.5 - 5.1 mmol/L    Comment: SLIGHT HEMOLYSIS   Chloride 124 (H) 101 - 111 mmol/L   CO2 15 (L) 22 - 32 mmol/L   Glucose, Bld 58 (L) 65 - 99 mg/dL   BUN 23 (H) 6 - 20 mg/dL   Creatinine, Ser 1.07 (H) 0.44 - 1.00 mg/dL   Calcium 7.6 (L) 8.9 - 10.3 mg/dL   GFR calc non Af Amer 47 (L) >60 mL/min   GFR calc Af Amer 54 (L) >60 mL/min    Comment: (NOTE) The eGFR has been calculated using the CKD EPI equation. This calculation has not been validated in all clinical situations. eGFR's persistently <60 mL/min signify possible Chronic Kidney Disease.    Anion gap 10 5 - 15  CBC with Differential/Platelet     Status: Abnormal   Collection Time: 01/31/16 10:12 AM  Result Value Ref Range   WBC 5.9 4.0 - 10.5 K/uL    Comment: REPEATED TO VERIFY WHITE COUNT CONFIRMED ON SMEAR    RBC 3.87 3.87 - 5.11 MIL/uL   Hemoglobin 10.8 (L) 12.0 - 15.0 g/dL   HCT 33.6 (L) 36.0 - 46.0 %   MCV 86.8 78.0 - 100.0 fL   MCH 27.9 26.0 - 34.0 pg   MCHC 32.1 30.0 - 36.0 g/dL   RDW 19.5 (H) 11.5 - 15.5 %   Platelets 122 (L) 150 - 400 K/uL    Comment: SPECIMEN CHECKED FOR CLOTS REPEATED TO VERIFY PLATELET COUNT CONFIRMED BY SMEAR    Neutrophils Relative % 57 %   Lymphocytes Relative 31 %   Monocytes Relative 10 %   Eosinophils Relative 1 %   Basophils Relative 1 %   Neutro Abs 3.3 1.7 - 7.7 K/uL   Lymphs Abs 1.8 0.7 - 4.0 K/uL   Monocytes Absolute 0.6 0.1 - 1.0 K/uL   Eosinophils Absolute 0.1 0.0 - 0.7 K/uL   Basophils Absolute 0.1 0.0 - 0.1 K/uL    RBC Morphology RARE NRBCs     Comment: TEARDROP CELLS BURR CELLS   Osmolality, urine     Status: None   Collection Time: 01/31/16 11:14 AM  Result Value Ref Range   Osmolality, Ur 649 300 - 900 mOsm/kg    Comment: REPEATED TO VERIFY  Sodium, urine, random     Status: None   Collection Time: 01/31/16 11:14 AM  Result Value Ref Range   Sodium, Ur 107 mmol/L  Lactic acid, plasma     Status: None   Collection Time: 01/31/16 11:49 AM  Result Value Ref Range   Lactic Acid, Venous 1.2 0.5 - 2.0 mmol/L  Glucose, capillary     Status: None   Collection Time: 01/31/16 12:32 PM  Result Value Ref Range   Glucose-Capillary 75 65 - 99 mg/dL   Comment 1 Notify RN    Comment 2 Document  in Chart     MICRO: 5/15 blood cx 1 of 4 bottle MSSA 5/16 blood cx pending  IMAGING: Dg Chest 2 View  01/30/2016  CLINICAL DATA:  Slurred speech and hallucinations. EXAM: CHEST  2 VIEW COMPARISON:  12/27/2015 FINDINGS: The heart size and mediastinal contours are within normal limits. Stable bilateral pulmonary scarring. Lung volumes are low with bibasilar atelectasis present. There is no evidence of pulmonary edema, consolidation, pneumothorax, nodule or pleural fluid. The visualized skeletal structures are unremarkable. IMPRESSION: Low lung volumes with bibasilar atelectasis. Electronically Signed   By: Aletta Edouard M.D.   On: 01/30/2016 15:08   Ct Head Wo Contrast  01/30/2016  CLINICAL DATA:  Slurred speech and hallucinations since this morning. EXAM: CT HEAD WITHOUT CONTRAST TECHNIQUE: Contiguous axial images were obtained from the base of the skull through the vertex without intravenous contrast. COMPARISON:  None. FINDINGS: Brain: No evidence of acute infarction, hemorrhage, or mass effect. There is atrophy and chronic small vessel disease changes. There is prominent encephalomalacia of the left cerebellar hemisphere. Vascular: Vascular calcifications at the skullbase. Skull: Negative for fracture or focal  lesion. Sinuses/Orbits: No acute findings. Other: None. IMPRESSION: No evidence of acute infarction or intracranial hemorrhage. Atrophy, chronic microvascular disease. 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 Electronically Signed   By: Fidela Salisbury M.D.   On: 01/30/2016 15:19   Dg Chest Port 1 View  01/31/2016  CLINICAL DATA:  G93.40 (ICD-10-CM) - Acute encephalopathy R29.810 (ICD-10-CM) - Weakness on left side of face R09.89 (ICD-10-CM) - Bibasilar crackles EXAM: PORTABLE CHEST 1 VIEW COMPARISON:  01/30/2016 FINDINGS: Cardiac silhouette is mildly enlarged. Aorta is uncoiled. No mediastinal or hilar masses or convincing adenopathy. There persistent low lung volumes with elevation of right hemidiaphragm. No convincing pneumonia or pulmonary edema. Bony thorax is demineralized but grossly intact. IMPRESSION: 1. No acute findings. No significant change from the previous day's study. Electronically Signed   By: Lajean Manes M.D.   On: 01/31/2016 11:10    Assessment/Plan:  82yo F with history of breast CA and exfoliative dermatitis, admitted for AMS, weakness found to have MSSA bacteremia  - continue on cefazolin 2gm IV q 8hr - await results at 48 hr mark to see if still having persistent bacteremia - source is likely due to dermatitis - recommend TTE. - repeat cultures have been drawn to document clearance. - will recommend to treat for 4 weeks as complicated bacteremia, defer getting TEE due to AMS, dementia, risk of aspiration  Khaden Gater B. [facility] [phone]

## 2016-01-31 NOTE — Evaluation (Signed)
Clinical/Bedside Swallow Evaluation Patient Details  Name: Sharon Velasquez MRN: [id number] Date of Birth: [birth year]  Today's Date: 01/31/2016 Time: SLP Start Time (ACUTE ONLY): [id number] SLP Stop Time (ACUTE ONLY): 0941 SLP Time Calculation (min) (ACUTE ONLY): 16 min  Past Medical History:  Past Medical History  Diagnosis Date  . Hypertension   . GERD (gastroesophageal reflux disease)   . Cancer ([place])     breast  . Seasonal allergies    Past Surgical History: History reviewed. No pertinent past surgical history. HPI:  82 y.o. 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   Assessment / Plan / Recommendation Clinical Impression  Pt has audible swallow and wet vocal quality with thin liquids, concerning for discoordination of swallow sequence and possible penetration/aspiraiton. Cues provided for throat clear, which is effective at returning vocal quality to baseline. No overt signs of aspiration noted with nectar thick liquids and solids, although oral residue remains even after softer solid trials. SLP administered liquid wash to clear. Recommend Dys 2 diet and nectar thick liquids. Will f/u for tolerance and readiness to advance.    Aspiration Risk  Mild aspiration risk    Diet Recommendation Dysphagia 2 (Fine chop);Nectar-thick liquid   Liquid Administration via: Cup;Straw Medication Administration: Whole meds with puree (crush larger pills) Supervision: Staff to assist with self feeding;Full supervision/cueing for compensatory strategies Compensations: Slow rate;Small sips/bites;Follow solids with liquid Postural Changes: Seated upright at 90 degrees;Remain upright for at least 30 minutes after po intake     Other  Recommendations Oral Care Recommendations: Oral care BID Other Recommendations: Order thickener from pharmacy;Prohibited food (jello, ice cream, thin soups);Remove water pitcher   Follow up Recommendations  Skilled Nursing facility    Frequency and Duration min 2x/week  2 weeks       Prognosis Prognosis for Safe Diet Advancement: Good      Swallow Study   General HPI: 82 y.o. 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 Type of Study: Bedside Swallow Evaluation Previous Swallow Assessment: none in chart Diet Prior to this Study: NPO Temperature Spikes Noted: No Respiratory Status: Room air History of Recent Intubation: No Behavior/Cognition: Alert;Cooperative;Requires cueing Oral Cavity Assessment: Within Functional Limits Oral Care Completed by SLP: No Patient Positioning: Upright in bed Baseline Vocal Quality: Low vocal intensity    Oral/Motor/Sensory Function Overall Oral Motor/Sensory Function: Generalized oral weakness   Ice Chips Ice chips: Not tested   Thin Liquid Thin Liquid: Impaired Presentation: Cup;Straw Pharyngeal  Phase Impairments: Wet Vocal Quality;Other (comments) (audible swallow)    Nectar Thick Nectar Thick Liquid: Within functional limits Presentation: Straw   Honey Thick Honey Thick Liquid: Not tested   Puree Puree: Within functional limits Presentation: Spoon   Solid   GO   Solid: Impaired Oral Phase Functional Implications: Oral residue       Germain Osgood, M.A. CCC-SLP [phone]  Germain Osgood 01/31/2016,10:04 AM

## 2016-01-31 NOTE — Progress Notes (Signed)
We will see patient as formal consult. Patient found to have MSSA bacteremia. Please change abtx to cefazolin, renal adjusted for bacteremia.   Please get more information of possible steven-johnson's diagnosis and what agent thought to contribute to it.  Sharon Velasquez [place] for Infectious Diseases [phone]

## 2016-01-31 NOTE — Progress Notes (Signed)
  PHARMACY - PHYSICIAN COMMUNICATION CRITICAL VALUE ALERT - BLOOD CULTURE IDENTIFICATION (BCID)  Results for orders placed or performed during the hospital encounter of 01/30/16  Blood Culture ID Panel (Reflexed) (Collected: 01/30/2016  2:20 PM)  Result Value Ref Range   Enterococcus species NOT DETECTED NOT DETECTED   Vancomycin resistance NOT DETECTED NOT DETECTED   Listeria monocytogenes NOT DETECTED NOT DETECTED   Staphylococcus species NOT DETECTED NOT DETECTED   Staphylococcus aureus DETECTED (A) NOT DETECTED   Methicillin resistance NOT DETECTED NOT DETECTED   Streptococcus species NOT DETECTED NOT DETECTED   Streptococcus agalactiae NOT DETECTED NOT DETECTED   Streptococcus pneumoniae NOT DETECTED NOT DETECTED   Streptococcus pyogenes NOT DETECTED NOT DETECTED   Acinetobacter baumannii NOT DETECTED NOT DETECTED   Enterobacteriaceae species NOT DETECTED NOT DETECTED   Enterobacter cloacae complex NOT DETECTED NOT DETECTED   Escherichia coli NOT DETECTED NOT DETECTED   Klebsiella oxytoca NOT DETECTED NOT DETECTED   Klebsiella pneumoniae NOT DETECTED NOT DETECTED   Proteus species NOT DETECTED NOT DETECTED   Serratia marcescens NOT DETECTED NOT DETECTED   Carbapenem resistance NOT DETECTED NOT DETECTED   Haemophilus influenzae NOT DETECTED NOT DETECTED   Neisseria meningitidis NOT DETECTED NOT DETECTED   Pseudomonas aeruginosa NOT DETECTED NOT DETECTED   Candida albicans NOT DETECTED NOT DETECTED   Candida glabrata NOT DETECTED NOT DETECTED   Candida krusei NOT DETECTED NOT DETECTED   Candida parapsilosis NOT DETECTED NOT DETECTED   Candida tropicalis NOT DETECTED NOT DETECTED    Name of physician (or Provider) Contacted: Carlyle Basques and Patel  Changes to prescribed antibiotics required: Stop ceftriaxone and start cefazolin 2g IV q8h.  Candie Mile 01/31/2016  8:38 AM

## 2016-01-31 NOTE — Progress Notes (Signed)
Received call from lab stating that CBC that was drawn earlier was clotted and would need to be drawn again by phlebotomy. Consuelo Pandy RN

## 2016-01-31 NOTE — Progress Notes (Signed)
Pharmacy Antibiotic Note  Sharon Velasquez is a 82 y.o. female admitted on 01/30/2016 with bacteremia.  Pharmacy has been consulted for cefazolin dosing.  Plan: Cefazolin 2 g q8h  Height: 5\' 3"  (160 cm) Weight: 244 lb 0.8 oz (110.7 kg) IBW/kg (Calculated) : 52.4  Temp (24hrs), Avg:96.2 F (35.7 C), Min:92.2 F (33.4 C), Max:98.9 F (37.2 C)   Recent Labs Lab 01/30/16 1438 01/30/16 1449  WBC  --  7.2  CREATININE 1.20* 1.18*  LATICACIDVEN 1.47  --     Estimated Creatinine Clearance: 43.2 mL/min (by C-G formula based on Cr of 1.18).    Allergies  Allergen Reactions  . Latex   . 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 MAR    Antimicrobials this admission: Cefazolin 5/16>>  Microbiology results: 1/2 Blood MSSA (BCID - pending cx)  Levester Fresh, PharmD, BCPS, [facility] Clinical Pharmacist Pager [phone] 01/31/2016 8:41 AM

## 2016-01-31 NOTE — Progress Notes (Signed)
Peripherally Inserted Central Catheter/Midline Placement  The IV Nurse has discussed with the patient and/or persons authorized to consent for the patient, the purpose of this procedure and the potential benefits and risks involved with this procedure.  The benefits include less needle sticks, lab draws from the catheter and patient may be discharged home with the catheter.  Risks include, but not limited to, infection, bleeding, blood clot (thrombus formation), and puncture of an artery; nerve damage and irregular heat beat.  Alternatives to this procedure were also discussed.  PICC/Midline Placement Documentation    Information given to daughter    Sharon Velasquez 01/31/2016, 6:42 PM

## 2016-01-31 NOTE — Progress Notes (Signed)
CRITICAL VALUE ALERT  Critical value received:  K+=7.0   Date of notification: 01/31/16  Time of notification:  0900  Critical value read back:yes  Nurse who received alert: Consuelo Pandy RN  MD notified (1st page): Dr Posey Pronto  Time of first page: 0905  MD notified (2nd page):  Time of second page:  Responding MD: waiting for response  Time MD responded

## 2016-02-01 ENCOUNTER — Inpatient Hospital Stay (HOSPITAL_COMMUNITY): Payer: Medicare Other

## 2016-02-01 DIAGNOSIS — E876 Hypokalemia: Secondary | ICD-10-CM

## 2016-02-01 DIAGNOSIS — E870 Hyperosmolality and hypernatremia: Secondary | ICD-10-CM

## 2016-02-01 DIAGNOSIS — R509 Fever, unspecified: Secondary | ICD-10-CM

## 2016-02-01 LAB — CBC WITH DIFFERENTIAL/PLATELET
BASOS ABS: 0 10*3/uL (ref 0.0–0.1)
BASOS PCT: 1 %
Eosinophils Absolute: 0.4 10*3/uL (ref 0.0–0.7)
Eosinophils Relative: 6 %
HEMATOCRIT: 34.7 % — AB (ref 36.0–46.0)
HEMOGLOBIN: 10.7 g/dL — AB (ref 12.0–15.0)
Lymphocytes Relative: 40 %
Lymphs Abs: 2.8 10*3/uL (ref 0.7–4.0)
MCH: 26.6 pg (ref 26.0–34.0)
MCHC: 30.8 g/dL (ref 30.0–36.0)
MCV: 86.1 fL (ref 78.0–100.0)
MONOS PCT: 14 %
Monocytes Absolute: 0.9 10*3/uL (ref 0.1–1.0)
NEUTROS ABS: 2.6 10*3/uL (ref 1.7–7.7)
NEUTROS PCT: 39 %
Platelets: 135 10*3/uL — ABNORMAL LOW (ref 150–400)
RBC: 4.03 MIL/uL (ref 3.87–5.11)
RDW: 19.1 % — ABNORMAL HIGH (ref 11.5–15.5)
WBC: 6.4 10*3/uL (ref 4.0–10.5)

## 2016-02-01 LAB — COMPREHENSIVE METABOLIC PANEL
ALK PHOS: 124 U/L (ref 38–126)
ALT: 53 U/L (ref 14–54)
AST: 62 U/L — AB (ref 15–41)
Albumin: 2 g/dL — ABNORMAL LOW (ref 3.5–5.0)
Anion gap: 8 (ref 5–15)
BUN: 19 mg/dL (ref 6–20)
CALCIUM: 7.9 mg/dL — AB (ref 8.9–10.3)
CHLORIDE: 118 mmol/L — AB (ref 101–111)
CO2: 24 mmol/L (ref 22–32)
CREATININE: 1.07 mg/dL — AB (ref 0.44–1.00)
GFR calc Af Amer: 54 mL/min — ABNORMAL LOW (ref >60)
GFR calc non Af Amer: 47 mL/min — ABNORMAL LOW (ref >60)
GLUCOSE: 80 mg/dL (ref 65–99)
Potassium: 3.2 mmol/L — ABNORMAL LOW (ref 3.5–5.1)
SODIUM: 150 mmol/L — AB (ref 135–145)
Total Bilirubin: 0.2 mg/dL — ABNORMAL LOW (ref 0.3–1.2)
Total Protein: 5.3 g/dL — ABNORMAL LOW (ref 6.5–8.1)

## 2016-02-01 LAB — GLUCOSE, CAPILLARY
GLUCOSE-CAPILLARY: 97 mg/dL (ref 65–99)
Glucose-Capillary: 103 mg/dL — ABNORMAL HIGH (ref 65–99)
Glucose-Capillary: 95 mg/dL (ref 65–99)

## 2016-02-01 LAB — ECHOCARDIOGRAM COMPLETE
Height: 63 in
Weight: 3904.79 oz

## 2016-02-01 LAB — PROTIME-INR
INR: 1.25 (ref 0.00–1.49)
Prothrombin Time: 15.8 seconds — ABNORMAL HIGH (ref 11.6–15.2)

## 2016-02-01 LAB — MAGNESIUM: Magnesium: 2.1 mg/dL (ref 1.7–2.4)

## 2016-02-01 LAB — UREA NITROGEN, URINE: Urea Nitrogen, Ur: 698 mg/dL

## 2016-02-01 MED ORDER — SODIUM CHLORIDE 0.9 % IV BOLUS (SEPSIS)
500.0000 mL | Freq: Once | INTRAVENOUS | Status: AC
  Administered 2016-02-01: 500 mL via INTRAVENOUS

## 2016-02-01 MED ORDER — SODIUM CHLORIDE 0.45 % IV BOLUS
250.0000 mL | Freq: Once | INTRAVENOUS | Status: AC
  Administered 2016-02-01: 250 mL via INTRAVENOUS

## 2016-02-01 MED ORDER — CETYLPYRIDINIUM CHLORIDE 0.05 % MT LIQD
7.0000 mL | Freq: Two times a day (BID) | OROMUCOSAL | Status: DC
  Administered 2016-02-01 – 2016-02-06 (×10): 7 mL via OROMUCOSAL

## 2016-02-01 MED ORDER — PERFLUTREN LIPID MICROSPHERE
1.0000 mL | INTRAVENOUS | Status: AC | PRN
  Administered 2016-02-01: 6 mL via INTRAVENOUS
  Filled 2016-02-01: qty 10

## 2016-02-01 MED ORDER — POTASSIUM CHLORIDE 10 MEQ/50ML IV SOLN
10.0000 meq | INTRAVENOUS | Status: AC
  Administered 2016-02-01 (×3): 10 meq via INTRAVENOUS
  Filled 2016-02-01 (×2): qty 50

## 2016-02-01 NOTE — Progress Notes (Signed)
Speech Language Pathology Treatment: Dysphagia  Patient Details Name: Sharon Velasquez MRN: [id number] DOB: [birth year] Today's Date: 02/01/2016 Time: [id number] SLP Time Calculation (min) (ACUTE ONLY): 13 min  Assessment / Plan / Recommendation Clinical Impression  Pt consumed bites of puree and sips of nectar thick liquids from breakfast tray, but politely declined more solid textures. Mod cues provided for sustained arousal throughout session. She had prolonged manipulation and oral transit with solids, with ultimate clearance with Min cues from SLP and use of liquid wash. Pt requested trials of thin water, which resulted in wet vocal quality. Cued throat clearing/coughing is very weak. Recommend to continue with current textures.   HPI HPI: 82 y.o. 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      SLP Plan  Continue with current plan of care     Recommendations  Diet recommendations: Dysphagia 2 (fine chop);Nectar-thick liquid Liquids provided via: Cup;Straw Medication Administration: Whole meds with puree (crush larger pills) Supervision: Staff to assist with self feeding;Full supervision/cueing for compensatory strategies Compensations: Slow rate;Small sips/bites;Follow solids with liquid Postural Changes and/or Swallow Maneuvers: Seated upright 90 degrees;Upright 30-60 min after meal             Oral Care Recommendations: Oral care BID Follow up Recommendations: Skilled Nursing facility Plan: Continue with current plan of care     GO               Germain Osgood, M.A. CCC-SLP [phone]  Germain Osgood 02/01/2016, 12:10 PM

## 2016-02-01 NOTE — Clinical Social Work Note (Signed)
CSW spoke with Arby Barrette, admissions coordinator at [facility] about patient returning. [name] stated that prior to admission, patient was discharging from [place] on 5/19 and was fairly independent. [name] wanted to pursue home health and if she did not qualify, they would take her again. CSW will wait on PT eval.  Dayton Scrape, [place]

## 2016-02-01 NOTE — Progress Notes (Signed)
  Echocardiogram 2D Echocardiogram has been performed.  Sharon Velasquez 02/01/2016, 3:51 PM

## 2016-02-01 NOTE — Progress Notes (Signed)
PROGRESS NOTE    Sharon Velasquez  [id number] DOB: 1943-02-28 DOA: 01/30/2016 PCP: No primary care provider on file.   Outpatient Specialists:     Brief Narrative:  Patient was admitted on 01/30/2016, with complaint of speech difficulty, was found to have MSSA bacteremia. Currently further plan is continue further workup for acute encephalopathy as well as MSSA bacteremia.   Assessment & Plan:   Principal Problem:   MSSA (methicillin susceptible Staphylococcus aureus) infection Active Problems:   Hypertension   Malignant neoplasm of female breast ([place])   Erythematous condition   Morbid (severe) obesity due to excess calories (HCC)   GERD (gastroesophageal reflux disease)   Cancer (HCC)   UTI (lower urinary tract infection)   Hypernatremia   Acute encephalopathy   MSSA (methicillin susceptible Staphylococcus aureus) infection Suspected UTI. 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 Blood cultures are positive for MSSA. Antibiotic changed to cefazolin. Appreciate input from infectious disease- Dr. Baxter Flattery: will recommend to treat for 4 weeks as complicated bacteremia, defer getting TEE due to AMS, dementia, risk of aspiration ESR CRP markedly elevated.  Acute encephalopathy.  CT scan unremarkable. Has significant hypernatremia likely evidence of dehydration.  MRI of the brain shows old stroke PT/OT and speech evaluation: Speech evaluation recommend dysphagia type II diet. 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 Consultation mentions that the patient may have atypical Stevens-Johnson syndrome but later on discharge summary mentions this is less likely Stevens-Johnson syndrome and therefore patient was treated with high-dose steroids and  improved. As per patient of this condition is worsening again after discharge home. Less likely Stevens-Johnson syndrome therefore we will continue to monitor the patient at [facility] instead of transfer. No mucosal membrane ulceration, Nikolsky negative  Essential hypertension. Holding blood pressure medication in the setting of borderline blood pressure. Cortisol normal.  Hypoglycemia -was not eating well -D5- added accuchecks  History of breast cancer. Continue home regimen.  Hypothermia. Most likely the setting of infection/hypoglycemia. Bair hugger d/c'd  Hypokalemia -replete  DVT prophylaxis:  SQ Heparin  Code Status: Full Code   Family Communication:   Disposition Plan:  From SNF (most recently) plan to return there   Consultants:   ID  Procedures:        Subjective: Much more awake-- able to provide history C/o thirst  Objective: Filed Vitals:   02/01/16 0300 02/01/16 0318 02/01/16 0400 02/01/16 0500  BP: 103/64  95/57 110/72  Pulse: 77  71   Temp:  97.4 F (36.3 C)    TempSrc:  Axillary    Resp: _0 Height:      Weight:      SpO2: 100%  93%     Intake/Output Summary (Last 24 hours) at 02/01/16 0756 Last data filed at 02/01/16 0309  Gross per 24 hour  Intake 1877.5 ml  Output    150 ml  Net 1727.5 ml   Filed Weights   01/30/16 1854  Weight: 110.7 kg (244 lb 0.8 oz)    Examination:  General exam: Appears calm and comfortable - NAD Respiratory system: Coarse breath sounds that cleared with cough. Cardiovascular system: S1 & S2 heard, RRR. No JVD, murmurs, rubs, gallops or clicks. No pedal edema. Gastrointestinal system: Abdomen is nondistended, soft and nontender. No organomegaly  or masses felt. Normal bowel sounds heard. Extremities: Symmetric 5 x 5 power. Skin: peeling Psychiatry:  Confused about living situtation     Data Reviewed: I have personally reviewed following labs and imaging  studies  CBC:  Recent Labs Lab 01/30/16 1438 01/30/16 1449 01/31/16 1012 02/01/16 0435  WBC  --  7.2 5.9 6.4  NEUTROABS  --  4.4 3.3 2.6  HGB 15.3* 12.9 10.8* 10.7*  HCT 45.0 40.3 33.6* 34.7*  MCV  --  85.2 86.8 86.1  PLT  --  148* 122* 829*   Basic Metabolic Panel:  Recent Labs Lab 01/30/16 1438 01/30/16 1449 01/31/16 0735 01/31/16 1012 02/01/16 0435  NA 152* 151* 153* 149* 150*  K 4.1 4.3 7.0* 5.0 3.2*  CL 114* 115* 122* 124* 118*  CO2  --  24 14* 15* 24  GLUCOSE 70 77 47* 58* 80  BUN 27* 24* 24* 23* 19  CREATININE 1.20* 1.18* 1.23* 1.07* 1.07*  CALCIUM  --  9.1 8.0* 7.6* 7.9*  MG  --   --   --   --  2.1   GFR: Estimated Creatinine Clearance: 47.6 mL/min (by C-G formula based on Cr of 1.07). Liver Function Tests:  Recent Labs Lab 01/30/16 1449 02/01/16 0435  AST 80* 62*  ALT 77* 53  ALKPHOS 166* 124  BILITOT 0.4 0.2*  PROT 6.6 5.3*  ALBUMIN 2.7* 2.0*   No results for input(s): LIPASE, AMYLASE in the last 168 hours. No results for input(s): AMMONIA in the last 168 hours. Coagulation Profile:  Recent Labs Lab 02/01/16 0435  INR 1.25   Cardiac Enzymes:  Recent Labs Lab 01/30/16 1449 01/30/16 1921 01/31/16 0133 01/31/16 0735  TROPONINI 0.04* <0.03 <0.03 0.03   BNP (last 3 results) No results for input(s): PROBNP in the last 8760 hours. HbA1C: No results for input(s): HGBA1C in the last 72 hours. CBG:  Recent Labs Lab 01/30/16 1319 01/30/16 1343 01/30/16 1639 01/31/16 1232 01/31/16 1929  GLUCAP 64* 67 70 75 62*   Lipid Profile: No results for input(s): CHOL, HDL, LDLCALC, TRIG, CHOLHDL, LDLDIRECT in the last 72 hours. Thyroid Function Tests:  Recent Labs  01/31/16 1217  TSH 4.644*   Anemia Panel: No results for input(s): VITAMINB12, FOLATE, FERRITIN, TIBC, IRON, RETICCTPCT in the last 72 hours. Urine analysis:    Component Value Date/Time   COLORURINE YELLOW 01/30/2016 1359   APPEARANCEUR CLOUDY* 01/30/2016 1359    LABSPEC 1.023 01/30/2016 1359   PHURINE 5.0 01/30/2016 1359   GLUCOSEU NEGATIVE 01/30/2016 1359   HGBUR NEGATIVE 01/30/2016 1359   BILIRUBINUR SMALL* 01/30/2016 1359   KETONESUR 15* 01/30/2016 1359   PROTEINUR NEGATIVE 01/30/2016 1359   NITRITE NEGATIVE 01/30/2016 1359   LEUKOCYTESUR SMALL* 01/30/2016 1359    Recent Results (from the past 240 hour(s))  Urine culture     Status: Abnormal   Collection Time: 01/30/16  1:59 PM  Result Value Ref Range Status   Specimen Description URINE, CLEAN CATCH  Final   Special Requests Normal  Final   Culture MULTIPLE SPECIES PRESENT, SUGGEST RECOLLECTION (A)  Final   Report Status 01/31/2016 FINAL  Final  Culture, blood (routine x 2)     Status: None (Preliminary result)   Collection Time: 01/30/16  2:15 PM  Result Value Ref Range Status   Specimen Description BLOOD RIGHT ANTECUBITAL  Final   Special Requests   Final    BOTTLES DRAWN AEROBIC AND ANAEROBIC 10CC AER 5CC ANA   Culture NO GROWTH <  24 HOURS  Final   Report Status PENDING  Incomplete  Culture, blood (routine x 2)     Status: None (Preliminary result)   Collection Time: 01/30/16  2:20 PM  Result Value Ref Range Status   Specimen Description BLOOD RIGHT HAND  Final   Special Requests BOTTLES DRAWN AEROBIC ONLY [place]  Final   Culture  Setup Time   Final    GRAM POSITIVE COCCI IN CLUSTERS AEROBIC BOTTLE ONLY Organism ID to follow CRITICAL RESULT CALLED TO, READ BACK BY AND VERIFIED WITH: J. Darleen Crocker D AT 0809 ON [id number] BY Rhea Bleacher    Culture CULTURE REINCUBATED FOR BETTER GROWTH  Final   Report Status PENDING  Incomplete  Blood Culture ID Panel (Reflexed)     Status: Abnormal   Collection Time: 01/30/16  2:20 PM  Result Value Ref Range Status   Enterococcus species NOT DETECTED NOT DETECTED Final   Vancomycin resistance NOT DETECTED NOT DETECTED Final   Listeria monocytogenes NOT DETECTED NOT DETECTED Final   Staphylococcus species NOT DETECTED NOT DETECTED Final    Staphylococcus aureus DETECTED (A) NOT DETECTED Final    Comment: CRITICAL RESULT CALLED TO, READ BACK BY AND VERIFIED WITH: J. Darleen Crocker D AT 0809 ON [id number] BY S. YARBROUGH    Methicillin resistance NOT DETECTED NOT DETECTED Final   Streptococcus species NOT DETECTED NOT DETECTED Final   Streptococcus agalactiae NOT DETECTED NOT DETECTED Final   Streptococcus pneumoniae NOT DETECTED NOT DETECTED Final   Streptococcus pyogenes NOT DETECTED NOT DETECTED Final   Acinetobacter baumannii NOT DETECTED NOT DETECTED Final   Enterobacteriaceae species NOT DETECTED NOT DETECTED Final   Enterobacter cloacae complex NOT DETECTED NOT DETECTED Final   Escherichia coli NOT DETECTED NOT DETECTED Final   Klebsiella oxytoca NOT DETECTED NOT DETECTED Final   Klebsiella pneumoniae NOT DETECTED NOT DETECTED Final   Proteus species NOT DETECTED NOT DETECTED Final   Serratia marcescens NOT DETECTED NOT DETECTED Final   Carbapenem resistance NOT DETECTED NOT DETECTED Final   Haemophilus influenzae NOT DETECTED NOT DETECTED Final   Neisseria meningitidis NOT DETECTED NOT DETECTED Final   Pseudomonas aeruginosa NOT DETECTED NOT DETECTED Final   Candida albicans NOT DETECTED NOT DETECTED Final   Candida glabrata NOT DETECTED NOT DETECTED Final   Candida krusei NOT DETECTED NOT DETECTED Final   Candida parapsilosis NOT DETECTED NOT DETECTED Final   Candida tropicalis NOT DETECTED NOT DETECTED Final  MRSA PCR Screening     Status: None   Collection Time: 01/30/16  6:56 PM  Result Value Ref Range Status   MRSA by PCR NEGATIVE NEGATIVE Final    Comment:        The GeneXpert MRSA Assay (FDA approved for NASAL specimens only), is one component of a comprehensive MRSA colonization surveillance program. It is not intended to diagnose MRSA infection nor to guide or monitor treatment for MRSA infections.       Anti-infectives    Start     Dose/Rate Route Frequency Ordered Stop   01/31/16 1500   cefTRIAXone (ROCEPHIN) 1 g in dextrose 5 % 50 mL IVPB  Status:  Discontinued     1 g 100 mL/hr over 30 Minutes Intravenous Every 24 hours 01/30/16 1853 01/31/16 0832   01/31/16 0845  ceFAZolin (ANCEF) IVPB 2g/100 mL premix     2 g 200 mL/hr over 30 Minutes Intravenous Every 8 hours 01/31/16 0832     01/30/16 1530  cefTRIAXone (ROCEPHIN) 1 g in dextrose  5 % 50 mL IVPB     1 g 100 mL/hr over 30 Minutes Intravenous  Once 01/30/16 1524 01/30/16 1634       Radiology Studies: Dg Chest 2 View  01/30/2016  CLINICAL DATA:  Slurred speech and hallucinations. EXAM: CHEST  2 VIEW COMPARISON:  12/27/2015 FINDINGS: The heart size and mediastinal contours are within normal limits. Stable bilateral pulmonary scarring. Lung volumes are low with bibasilar atelectasis present. There is no evidence of pulmonary edema, consolidation, pneumothorax, nodule or pleural fluid. The visualized skeletal structures are unremarkable. IMPRESSION: Low lung volumes with bibasilar atelectasis. Electronically Signed   By: Aletta Edouard M.D.   On: 01/30/2016 15:08   Ct Head Wo Contrast  01/30/2016  CLINICAL DATA:  Slurred speech and hallucinations since this morning. EXAM: CT HEAD WITHOUT CONTRAST TECHNIQUE: Contiguous axial images were obtained from the base of the skull through the vertex without intravenous contrast. COMPARISON:  None. FINDINGS: Brain: No evidence of acute infarction, hemorrhage, or mass effect. There is atrophy and chronic small vessel disease changes. There is prominent encephalomalacia of the left cerebellar hemisphere. Vascular: Vascular calcifications at the skullbase. Skull: Negative for fracture or focal lesion. Sinuses/Orbits: No acute findings. Other: None. IMPRESSION: No evidence of acute infarction or intracranial hemorrhage. Atrophy, chronic microvascular disease. 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 Electronically Signed   By: Fidela Salisbury M.D.    On: 01/30/2016 15:19   Mr Brain Wo Contrast  02/01/2016  CLINICAL DATA:  82 year old female with weakness, fatigue, altered mental status, slurred speech. Left facial weakness. 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 Initial encounter. EXAM: MRI HEAD WITHOUT CONTRAST TECHNIQUE: Multiplanar, multiecho pulse sequences of the brain and surrounding structures were obtained without intravenous contrast. COMPARISON:  Head CT without contrast 01/30/2016. [facility] portable chest radiograph 12/27/2015. FINDINGS: Moderate-sized chronic left cerebellar encephalomalacia re- demonstrated. No supratentorial encephalomalacia identified. No restricted diffusion to suggest acute infarction. No midline shift, mass effect, evidence of mass lesion, ventriculomegaly, extra-axial collection or acute intracranial hemorrhage. Cervicomedullary junction and pituitary are within normal limits. Normal supratentorial gray and white matter signal. Deep gray matter nuclei and brainstem are within normal limits. There may also be a small chronic right hemisphere cerebellar infarct (series 5, image 8). No chronic cerebral blood products identified. Visible internal auditory structures appear normal. Mastoids and paranasal sinuses are clear. Postoperative changes to the globes, otherwise negative orbit and scalp soft tissues. Normal bone marrow signal. Negative visualized cervical spine. IMPRESSION: 1.  No acute intracranial abnormality. 2. Moderate size chronic left cerebellar infarct. Electronically Signed   By: Genevie Ann M.D.   On: 02/01/2016 07:19   Dg Chest Port 1 View  01/31/2016  CLINICAL DATA:  G93.40 (ICD-10-CM) - Acute encephalopathy R29.810 (ICD-10-CM) - Weakness on left side of face R09.89 (ICD-10-CM) - Bibasilar crackles EXAM: PORTABLE CHEST 1 VIEW COMPARISON:  01/30/2016 FINDINGS: Cardiac silhouette is mildly enlarged. Aorta is uncoiled. No mediastinal or hilar  masses or convincing adenopathy. There persistent low lung volumes with elevation of right hemidiaphragm. No convincing pneumonia or pulmonary edema. Bony thorax is demineralized but grossly intact. IMPRESSION: 1. No acute findings. No significant change from the previous day's study. Electronically Signed   By: Lajean Manes M.D.   On: 01/31/2016 11:10        Scheduled Meds: . aspirin  81 mg Oral Daily  .  ceFAZolin (ANCEF) IV  2 g Intravenous Q8H  . exemestane  25 mg Oral Daily  . feeding supplement (ENSURE ENLIVE)  237 mL Oral BID BM  . ferrous sulfate  325 mg Oral Q breakfast  . folic acid  1 mg Oral Daily  . heparin  5,000 Units Subcutaneous Q8H  . hydrOXYzine  25 mg Oral TID  . lactobacillus acidophilus  2 tablet Oral Daily  . loratadine  10 mg Oral Daily  . multivitamin with minerals  1 tablet Oral Daily  . pantoprazole  40 mg Oral Daily  . sodium chloride flush  10-40 mL Intracatheter Q12H   Continuous Infusions: . dextrose Stopped (02/01/16 0556)     LOS: 2 days    Time spent: 35 min    Lynch, DO [facility] Pager [phone]  If 7PM-7AM, please contact night-coverage [email or web address] Password [facility] 02/01/2016, 7:56 AM

## 2016-02-01 NOTE — Care Management Note (Addendum)
Case Management Note  Patient Details  Name: Sharon Velasquez MRN: [id number] Date of Birth: 02-Apr-1931  Subjective/Objective:     Patient is from [facility], CSW referral, rectal temp of 94.5 , has bair huger, blood sugars down, has picc, NCM will cont to follow for dc needs.               Action/Plan:   Expected Discharge Date:                  Expected Discharge Plan:  Skilled Nursing Facility  In-House Referral:  Clinical Social Work  Discharge planning Services  CM Consult  Post Acute Care Choice:    Choice offered to:     DME Arranged:    DME Agency:     HH Arranged:    [place] Agency:     Status of Service:  In process, will continue to follow  Medicare Important Message Given:    Date Medicare IM Given:    Medicare IM give by:    Date Additional Medicare IM Given:    Additional Medicare Important Message give by:     If discussed at [place] of Stay Meetings, dates discussed:    Additional Comments:  Zenon Mayo, RN 02/01/2016, 4:18 PM

## 2016-02-01 NOTE — NC FL2 (Signed)
[place] LEVEL OF CARE SCREENING TOOL     IDENTIFICATION  Patient Name: Sharon Velasquez Birthdate: Aug 23, 1931 Sex: female Admission Date (Current Location): 01/30/2016  [facility] and [place] Number:  Herbalist and Address:  [facility] [place] [address], [place], [place] [address]      Provider Number: [id number]  Attending Physician Name and Address:  Geradine Girt, DO  Relative Name and Phone Number:       Current Level of Care: Hospital Recommended Level of Care: [place] Prior Approval Number:    Date Approved/Denied:   PASRR Number: [id number]  Discharge Plan: SNF    Current Diagnoses: Patient Active Problem List   Diagnosis Date Noted  . MSSA (methicillin susceptible Staphylococcus aureus) infection 01/31/2016  . Acute encephalopathy 01/31/2016  . UTI (lower urinary tract infection) 01/30/2016  . Hypernatremia 01/30/2016  . Vitamin D deficiency 01/07/2016  . Hypertension   . GERD (gastroesophageal reflux disease)   . Cancer ([place])   . Seasonal allergies   . 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 Candida infection of mouth 12/28/2015  . Colonic constipation 12/28/2015  . Acidosis, hyperchloremic 12/26/2015  . Elevated WBC count 12/26/2015  . Malignant neoplasm of female breast ([place]) 12/25/2015  . Erythematous condition 12/25/2015  . Morbid (severe) obesity due to excess calories ([place]) 12/25/2015  . Malignant neoplasm of kidney ([place]) 05/05/2014    Orientation RESPIRATION BLADDER Height & Weight     Self, Time, Place  O2 (Nasal Canula 1 L) Incontinent Weight: 244 lb 0.8 oz (110.7 kg) Height:  5\' 3"  (160 cm)  BEHAVIORAL SYMPTOMS/MOOD NEUROLOGICAL BOWEL NUTRITION STATUS   (None)  (None) Incontinent Diet (DYS 2. Fluid nectar thick.)  AMBULATORY STATUS COMMUNICATION OF NEEDS Skin   Limited Assist Verbally Other (Comment) (MSAD)                       Personal Care Assistance Level  of Assistance  Bathing, Feeding, Dressing Bathing Assistance: Limited assistance Feeding assistance: Limited assistance (Full supervision) Dressing Assistance: Limited assistance     Functional Limitations Info  Sight, Hearing, Speech Sight Info: Adequate Hearing Info: Adequate Speech Info: Adequate    SPECIAL CARE FACTORS FREQUENCY  Blood pressure, Speech therapy (PICC)             Speech Therapy Frequency: 5 x week      Contractures Contractures Info: Not present    Additional Factors Info  Code Status, Allergies Code Status Info: Full Allergies Info: Latex           Current Medications (02/01/2016):  This is the current hospital active medication list Current Facility-Administered Medications  Medication Dose Route Frequency Provider Last Rate Last Dose  . aspirin chewable tablet 81 mg  81 mg Oral Daily Albertine Patricia, MD   81 mg at 02/01/16 0744  . bisacodyl (DULCOLAX) suppository 10 mg  10 mg Rectal PRN Albertine Patricia, MD      . ceFAZolin (ANCEF) IVPB 2g/100 mL premix  2 g Intravenous Q8H Carlyle Basques, MD   2 g at 02/01/16 0309  . dextrose 5 % solution   Intravenous Continuous Lavina Hamman, MD 75 mL/hr at 02/01/16 1031    . exemestane (AROMASIN) tablet 25 mg  25 mg Oral Daily Albertine Patricia, MD   25 mg at 02/01/16 1026  . feeding supplement (ENSURE ENLIVE) (ENSURE ENLIVE) liquid 237 mL  237 mL Oral BID BM  Albertine Patricia, MD   Stopped at 01/31/16 1400  . ferrous sulfate tablet 325 mg  325 mg Oral Q breakfast Albertine Patricia, MD   325 mg at 02/01/16 0743  . folic acid (FOLVITE) tablet 1 mg  1 mg Oral Daily Silver Huguenin Elgergawy, MD   1 mg at 02/01/16 0745  . heparin injection 5,000 Units  5,000 Units Subcutaneous Q8H Albertine Patricia, MD   5,000 Units at 02/01/16 0522  . hydrOXYzine (ATARAX/VISTARIL) tablet 25 mg  25 mg Oral TID Albertine Patricia, MD   25 mg at 02/01/16 0745  . lactobacillus acidophilus (BACID) tablet 2 tablet  2 tablet Oral  Daily Albertine Patricia, MD   2 tablet at 02/01/16 0744  . loratadine (CLARITIN) tablet 10 mg  10 mg Oral Daily Albertine Patricia, MD   10 mg at 02/01/16 0744  . multivitamin with minerals tablet 1 tablet  1 tablet Oral Daily Albertine Patricia, MD   1 tablet at 02/01/16 0744  . pantoprazole (PROTONIX) EC tablet 40 mg  40 mg Oral Daily Albertine Patricia, MD   40 mg at 02/01/16 0744  . potassium chloride 10 mEq in 50 mL *CENTRAL LINE* IVPB  10 mEq Intravenous Q1 Hr x 3 Jessica U Vann, DO   10 mEq at 02/01/16 1027  . RESOURCE THICKENUP CLEAR   Oral PRN Lavina Hamman, MD      . sodium chloride flush (NS) 0.9 % injection 10-40 mL  10-40 mL Intracatheter Q12H Lavina Hamman, MD   10 mL at 02/01/16 0748  . sodium chloride flush (NS) 0.9 % injection 10-40 mL  10-40 mL Intracatheter PRN Lavina Hamman, MD         Discharge Medications: Please see discharge summary for a list of discharge medications.  Relevant Imaging Results:  Relevant Lab Results:   Additional Information SS#: [id number]  Candie Chroman, LCSW

## 2016-02-01 NOTE — Progress Notes (Signed)
  PHARMACY - PHYSICIAN COMMUNICATION CRITICAL VALUE ALERT - BLOOD CULTURE IDENTIFICATION (BCID)  Results for orders placed or performed during the hospital encounter of 01/30/16  Blood Culture ID Panel (Reflexed) (Collected: 01/30/2016  2:20 PM)  Result Value Ref Range   Enterococcus species NOT DETECTED NOT DETECTED   Vancomycin resistance NOT DETECTED NOT DETECTED   Listeria monocytogenes NOT DETECTED NOT DETECTED   Staphylococcus species NOT DETECTED NOT DETECTED   Staphylococcus aureus DETECTED (A) NOT DETECTED   Methicillin resistance NOT DETECTED NOT DETECTED   Streptococcus species NOT DETECTED NOT DETECTED   Streptococcus agalactiae NOT DETECTED NOT DETECTED   Streptococcus pneumoniae NOT DETECTED NOT DETECTED   Streptococcus pyogenes NOT DETECTED NOT DETECTED   Acinetobacter baumannii NOT DETECTED NOT DETECTED   Enterobacteriaceae species NOT DETECTED NOT DETECTED   Enterobacter cloacae complex NOT DETECTED NOT DETECTED   Escherichia coli NOT DETECTED NOT DETECTED   Klebsiella oxytoca NOT DETECTED NOT DETECTED   Klebsiella pneumoniae NOT DETECTED NOT DETECTED   Proteus species NOT DETECTED NOT DETECTED   Serratia marcescens NOT DETECTED NOT DETECTED   Carbapenem resistance NOT DETECTED NOT DETECTED   Haemophilus influenzae NOT DETECTED NOT DETECTED   Neisseria meningitidis NOT DETECTED NOT DETECTED   Pseudomonas aeruginosa NOT DETECTED NOT DETECTED   Candida albicans NOT DETECTED NOT DETECTED   Candida glabrata NOT DETECTED NOT DETECTED   Candida krusei NOT DETECTED NOT DETECTED   Candida parapsilosis NOT DETECTED NOT DETECTED   Candida tropicalis NOT DETECTED NOT DETECTED    Name of physician (or Provider) Contacted: Dr. Baxter Flattery  Changes to prescribed antibiotics required: No changes. Patient already on cefazolin.  Cassie L. Nicole Kindred, PharmD PGY2 Infectious Diseases Pharmacy Resident Pager: [phone] 02/01/2016 8:46 AM

## 2016-02-01 NOTE — Clinical Social Work Note (Signed)
Clinical Social Work Assessment  Patient Details  Name: Sharon Velasquez MRN: [id number] Date of Birth: April 21, 1931  Date of referral:  01/31/16               Reason for consult:  Facility Placement, Discharge Planning                Permission sought to share information with:  Facility Sport and exercise psychologist, Family Supports Permission granted to share information::  Yes, Verbal Permission Granted  Name::     Faith  Agency::  [facility]  Relationship::  Daughter  Contact Information:  [phone]  Housing/Transportation Living arrangements for the past 2 months:  [place], [place] of Information:  Medical Team, Adult Children Patient Interpreter Needed:  None Criminal Activity/Legal Involvement Pertinent to Current Situation/Hospitalization:  No - Comment as needed Significant Relationships:  Adult Children Lives with:  Facility Resident Do you feel safe going back to the place where you live?  Yes Need for family participation in patient care:  Yes (Comment)  Care giving concerns:  Patient will return to SNF following discharge.   Social Worker assessment / plan:  Patient not fully oriented. CSW called patient's daughter, Kyra Searles. CSW introduced role and explained that discharge planning would be discussed. CSW confirmed with patient's daughter that patient is from and would be returning to [facility] at discharge. Patient will need PTAR. No discharge date yet. No further concerns. CSW encouraged patient's daughter to contact CSW as needed. CSW will continue to follow patient and her family and facilitate discharge to SNF once medically stable.  Employment status:  Retired Forensic scientist:  Medicare PT Recommendations:  Not assessed at this time [place] / Referral to community resources:  Other (Comment Required) (None. Patient will return to [facility].)  Patient/Family's Response to care:  Patient not fully oriented. Patient's  daughter agreeable to return to [facility] at discharge. Patient's family supportive and involved in patient's care. Patient's daughter polite and appreciated social work intervention.  Patient/Family's Understanding of and Emotional Response to Diagnosis, Current Treatment, and Prognosis:  Patient's family knowledgeable of medical interventions and aware of plan for discharge back to SNF once medically stable.  Emotional Assessment Appearance:  Appears stated age Attitude/Demeanor/Rapport:  Unable to Assess Affect (typically observed):  Unable to Assess Orientation:  Oriented to Self, Oriented to Place, Oriented to  Time Alcohol / Substance use:  Never Used Psych involvement (Current and /or in the community):  No (Comment)  Discharge Needs  Concerns to be addressed:  Care Coordination Readmission within the last 30 days:  No Current discharge risk:  Dependent with Mobility Barriers to Discharge:  No Barriers Identified   Candie Chroman, LCSW 02/01/2016, 11:05 AM

## 2016-02-01 NOTE — Consult Note (Signed)
WOC consulted for possible use of antimicrobial wicking fabric for skin folds. Reviewed chart, patient with exfoliative dermatis, followed by infection disease.  Spoke with bedside nurse who reports partial thickness skin loss in the skin folds, linear type from moisture.  We will order Interdry Ag+, I have provided the [employer] # for the product and placed orders in the computer with guidance for use.   Re consult if needed, will not follow at this time. Thanks  Xaden Kaufman Kellogg, [place] [phone])

## 2016-02-01 NOTE — Progress Notes (Signed)
Rectal Temp 99.6 per rectal probe, Oral Temp  99.2.  Warming blanket removed with continuous rectal temperature monitoring still initiated.  Will continue to monitor patient.

## 2016-02-02 DIAGNOSIS — I10 Essential (primary) hypertension: Secondary | ICD-10-CM

## 2016-02-02 DIAGNOSIS — E440 Moderate protein-calorie malnutrition: Secondary | ICD-10-CM | POA: Insufficient documentation

## 2016-02-02 LAB — BASIC METABOLIC PANEL
ANION GAP: 10 (ref 5–15)
BUN: 14 mg/dL (ref 6–20)
CALCIUM: 7.9 mg/dL — AB (ref 8.9–10.3)
CO2: 21 mmol/L — AB (ref 22–32)
Chloride: 113 mmol/L — ABNORMAL HIGH (ref 101–111)
Creatinine, Ser: 1.03 mg/dL — ABNORMAL HIGH (ref 0.44–1.00)
GFR, EST AFRICAN AMERICAN: 57 mL/min — AB (ref >60)
GFR, EST NON AFRICAN AMERICAN: 49 mL/min — AB (ref >60)
Glucose, Bld: 90 mg/dL (ref 65–99)
POTASSIUM: 4 mmol/L (ref 3.5–5.1)
Sodium: 144 mmol/L (ref 135–145)

## 2016-02-02 LAB — CBC
HEMATOCRIT: 32.9 % — AB (ref 36.0–46.0)
HEMOGLOBIN: 10.6 g/dL — AB (ref 12.0–15.0)
MCH: 27.2 pg (ref 26.0–34.0)
MCHC: 32.2 g/dL (ref 30.0–36.0)
MCV: 84.4 fL (ref 78.0–100.0)
Platelets: 140 10*3/uL — ABNORMAL LOW (ref 150–400)
RBC: 3.9 MIL/uL (ref 3.87–5.11)
RDW: 19 % — AB (ref 11.5–15.5)
WBC: 7.8 10*3/uL (ref 4.0–10.5)

## 2016-02-02 LAB — CULTURE, BLOOD (ROUTINE X 2)

## 2016-02-02 LAB — GLUCOSE, CAPILLARY
GLUCOSE-CAPILLARY: 89 mg/dL (ref 65–99)
Glucose-Capillary: 77 mg/dL (ref 65–99)
Glucose-Capillary: 80 mg/dL (ref 65–99)
Glucose-Capillary: 93 mg/dL (ref 65–99)

## 2016-02-02 MED ORDER — SODIUM CHLORIDE 0.9 % IV BOLUS (SEPSIS)
500.0000 mL | Freq: Once | INTRAVENOUS | Status: AC
  Administered 2016-02-02: 500 mL via INTRAVENOUS

## 2016-02-02 MED ORDER — ONDANSETRON HCL 4 MG/2ML IJ SOLN
4.0000 mg | Freq: Four times a day (QID) | INTRAMUSCULAR | Status: DC | PRN
  Administered 2016-02-02: 4 mg via INTRAVENOUS
  Filled 2016-02-02: qty 2

## 2016-02-02 MED ORDER — CAMPHOR-MENTHOL 0.5-0.5 % EX LOTN
1.0000 "application " | TOPICAL_LOTION | CUTANEOUS | Status: DC | PRN
  Administered 2016-02-02: 1 via TOPICAL
  Filled 2016-02-02 (×2): qty 222

## 2016-02-02 NOTE — Progress Notes (Signed)
Initial Nutrition Assessment  DOCUMENTATION CODES:   Morbid obesity, Non-severe (moderate) malnutrition in context of chronic illness  INTERVENTION:    Magic cup TID with meals, each supplement provides 290 kcal and 9 grams of protein  NUTRITION DIAGNOSIS:   Inadequate oral intake related to poor appetite, dysphagia as evidenced by meal completion < 50%.  GOAL:   Patient will meet greater than or equal to 90% of their needs  MONITOR:   PO intake, Supplement acceptance, Labs, Weight trends, I & O's  REASON FOR ASSESSMENT:   Low [place]    ASSESSMENT:   82 y.o. female, with past medical history of bilateral malignant neoplasm of the breast, morbid obesity, debility, Sharon Velasquez syndrome, presented from SNF for weakness and fatigue, altered mental status, slurred speech. Admitted on 5/15 with MSSA bacteremia.  Patient reports poor oral intake since admission, but was eating okay PTA. S/P swallow evaluation with SLP and is now requiring nectar thick liquids. RN reports that it is difficult to thicken Ensure, so she has not been giving it to her. Patient does not like Ensure or Boost supplements, but has never tried Magic cups. She is consuming 0-30% of meals.  Nutrition-Focused physical exam completed. Findings are no fat depletion, mild muscle depletion, and mild edema. Patient with moderate PCM.   Diet Order:  DIET DYS 2 Room service appropriate?: Yes; Fluid consistency:: Nectar Thick  Skin:  Reviewed, no issues  Last BM:  5/18  Height:   Ht Readings from Last 1 Encounters:  01/30/16 5\' 3"  (1.6 m)    Weight:   Wt Readings from Last 1 Encounters:  01/30/16 244 lb 0.8 oz (110.7 kg)    Ideal Body Weight:  52.3 kg  BMI:  Body mass index is 43.24 kg/(m^2).  Estimated Nutritional Needs:   Kcal:  1800-2000  Protein:  110-125 gm  Fluid:  1.8-2 L  EDUCATION NEEDS:   No education needs identified at this time  Molli Barrows, [place], [place], [place] Pager  [phone] After Hours Pager [phone]

## 2016-02-02 NOTE — Evaluation (Signed)
Physical Therapy Evaluation Patient Details Name: Sharon Velasquez MRN: [id number] DOB: Jul 01, 1931 Today's Date: 02/02/2016   History of Present Illness  Sharon Velasquez is a 82 y.o. female with hx of breast ca, dementia, exfoliative dermatitis treated with steroids last month thru [facility] hospitalization, subsequently discharged to SNF. She is admitted for AMS, slurred speech, Weakness and fatigue,hypothermic with no other focal deficits  Clinical Impression  Patient presents with decreased mobility due to deficits listed in PT problem list.  She will benefit from skilled PT in the acute setting to allow return home following possible continued SNF level rehab.  Note that was planning d/c home from rehab prior to hospitalization.  Differences seem to be that she is unable to feed herself at this point or help much with bed mobility since she has used a lift for OOB for about 2 years.  Up to family if able to provide level of care at home she needs along with HHPT/aide services.    Follow Up Recommendations SNF    Equipment Recommendations  None recommended by PT    Recommendations for Other Services       Precautions / Restrictions Precautions Precautions: Fall Restrictions Weight Bearing Restrictions: Yes      Mobility  Bed Mobility Overal bed mobility: Needs Assistance             General bed mobility comments: +2 A for scooting to [facility] pt attempts to assist using railings with UE's; deferred up to EOB due to pt with N&V while in bed and reports doesn't feel up to exercise this morning  Transfers                    Ambulation/Gait                Stairs            Wheelchair Mobility    Modified Rankin (Stroke Patients Only)       Balance                                             Pertinent Vitals/Pain Pain Assessment: No/denies pain    Home Living Family/patient expects to be discharged to:: Skilled nursing facility                 Additional Comments: was at [facility] since earlier in May per pt report; states has used hoyer lift at home and not been up on her feet in about 2 years.  Reports was able to feed herself prior to admission, remained incontinent and getting a bed bath    Prior Function Level of Independence: Needs assistance   Gait / Transfers Assistance Needed: transfers via hoyer  ADL's / Homemaking Assistance Needed: was able to self feed, now needing assist due to difficulty with grip and upper body weakness        Hand Dominance        Extremity/Trunk Assessment   Upper Extremity Assessment: RUE deficits/detail;LUE deficits/detail RUE Deficits / Details: AAROM grossly WFL except finger flexion limited due to edema in hands and capsular tightness; strength shoulder flexion 2+/5 elbow flexion 3/5     LUE Deficits / Details: AAROM grossly WFL except finger flexion limited due to edema in hands and capsular tightness; strength shoulder flexion 2+/5 elbow flexion 3/5   Lower Extremity Assessment: RLE deficits/detail;LLE deficits/detail  RLE Deficits / Details: wasting in calf musculature and flat feet with joint changes (not weight bearing in 2 years); knee flexion limited to about 40 degrees AAROM with soft tissue approximation and joint stiffness but reports no pain, intact sensation to light touch LLE Deficits / Details: wasting in calf musculature and flat feet with joint changes (not weight bearing in 2 years); knee flexion limited to about 40 degrees AAROM with soft tissue approximation and joint stiffness but reports no pain, intact sensation to light touch     Communication   Communication: No difficulties  Cognition Arousal/Alertness: Awake/alert Behavior During Therapy: WFL for tasks assessed/performed Overall Cognitive Status: Within Functional Limits for tasks assessed                      General Comments General comments (skin integrity, edema,  etc.): skin changes sloghing and itching followed bu WOC RN and on air mattress, left geomat in room if able to use lift for OOB    Exercises        Assessment/Plan    PT Assessment Patient needs continued PT services  PT Diagnosis Generalized weakness   PT Problem List Decreased strength;Decreased activity tolerance;Decreased mobility;Decreased knowledge of use of DME;Decreased range of motion;Decreased skin integrity  PT Treatment Interventions Functional mobility training;Therapeutic activities;Therapeutic exercise;Patient/family education;Balance training;DME instruction   PT Goals (Current goals can be found in the Care Plan section) Acute Rehab PT Goals Patient Stated Goal: To get back home eventually PT Goal Formulation: With patient Time For Goal Achievement: 02/09/16 Potential to Achieve Goals: Fair    Frequency Min 2X/week   Barriers to discharge        Co-evaluation               End of Session   Activity Tolerance: Other (comment) (limited due to N&V) Patient left: with nursing/sitter in room;in bed;with call bell/phone within reach           Time: [id number] PT Time Calculation (min) (ACUTE ONLY): 25 min   Charges:   PT Evaluation $PT Eval Moderate Complexity: 1 Procedure PT Treatments $Therapeutic Activity: 8-22 mins   PT G CodesReginia Velasquez 02-12-2016, 10:37 AM  Sharon Velasquez, [place] 2016/02/12

## 2016-02-02 NOTE — Progress Notes (Signed)
[facility] for Infectious Disease    Date of Admission:  01/30/2016   Total days of antibiotics 4        Day 3 cefazolin           ID: Sharon Velasquez is a 82 y.o. female with  MSSA bacteremia Principal Problem:   MSSA (methicillin susceptible Staphylococcus aureus) infection Active Problems:   Hypertension   Malignant neoplasm of female breast ([place])   Erythematous condition   Morbid (severe) obesity due to excess calories ([facility])   GERD (gastroesophageal reflux disease)   Cancer ([facility])   UTI (lower urinary tract infection)   Hypernatremia   Acute encephalopathy   Malnutrition of moderate degree    Subjective: Afebrile, more alert today, fewer episodes of hypothermia. She reports having itchy skin  Medications:  . antiseptic oral rinse  7 mL Mouth Rinse BID  . aspirin  81 mg Oral Daily  .  ceFAZolin (ANCEF) IV  2 g Intravenous Q8H  . exemestane  25 mg Oral Daily  . feeding supplement (ENSURE ENLIVE)  237 mL Oral BID BM  . ferrous sulfate  325 mg Oral Q breakfast  . folic acid  1 mg Oral Daily  . heparin  5,000 Units Subcutaneous Q8H  . hydrOXYzine  25 mg Oral TID  . lactobacillus acidophilus  2 tablet Oral Daily  . loratadine  10 mg Oral Daily  . multivitamin with minerals  1 tablet Oral Daily  . pantoprazole  40 mg Oral Daily  . sodium chloride flush  10-40 mL Intracatheter Q12H    Objective: Vital signs in last 24 hours: Temp:  [98 F (36.7 C)-99.5 F (37.5 C)] 98.2 F (36.8 C) (05/18 1508) Pulse Rate:  [88-99] 88 (05/18 1508) Resp:  [13-22] 13 (05/18 1508) BP: (85-104)/(42-71) 99/47 mmHg (05/18 1508) SpO2:  [81 %-96 %] 91 % (05/18 1508) Physical Exam  Constitutional:  oriented to person, place, . appears well-developed and well-nourished. No distress.  HENT: [place]/AT, PERRLA, no scleral icterus Mouth/Throat: Oropharynx is clear and moist. No oropharyngeal exudate.  Cardiovascular: Normal rate, regular rhythm and normal heart sounds. Exam reveals no  gallop and no friction rub.  No murmur heard.  Pulmonary/Chest: Effort normal and breath sounds normal. No respiratory distress.  has no wheezes.  Neck = supple, no nuchal rigidity Abdominal: Soft. Bowel sounds are normal.  exhibits no distension. There is no tenderness.  Lymphadenopathy: no cervical adenopathy. No axillary adenopathy Neurological: alert and oriented to person, place, and time.  Ext: diffuse anasarca to upper extremities Skin: Skin is diffusiely dry with some excoriation marks to chest Psychiatric: a normal mood and affect.  behavior is normal.    Lab Results  Recent Labs  02/01/16 0435 02/02/16 0315  WBC 6.4 7.8  HGB 10.7* 10.6*  HCT 34.7* 32.9*  NA 150* 144  K 3.2* 4.0  CL 118* 113*  CO2 24 21*  BUN 19 14  CREATININE 1.07* 1.03*   Liver Panel  Recent Labs  02/01/16 0435  PROT 5.3*  ALBUMIN 2.0*  AST 62*  ALT 53  ALKPHOS 124  BILITOT 0.2*   Sedimentation Rate  Recent Labs  01/31/16 1800  ESRSEDRATE 60*   C-Reactive Protein  Recent Labs  01/31/16 1217  CRP 13.4*    Microbiology: 5/15 1 of 4 blood cx MSSA 5/16 blood cx 1 et CoNS Studies/Results: Mr Brain Wo Contrast  02/01/2016  CLINICAL DATA:  82 year old female with weakness, fatigue, altered mental status, slurred speech.  Left facial weakness. 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 Initial encounter. EXAM: MRI HEAD WITHOUT CONTRAST TECHNIQUE: Multiplanar, multiecho pulse sequences of the brain and surrounding structures were obtained without intravenous contrast. COMPARISON:  Head CT without contrast 01/30/2016. [facility] portable chest radiograph 12/27/2015. FINDINGS: Moderate-sized chronic left cerebellar encephalomalacia re- demonstrated. No supratentorial encephalomalacia identified. No restricted diffusion to suggest acute infarction. No midline shift, mass effect, evidence of mass lesion, ventriculomegaly,  extra-axial collection or acute intracranial hemorrhage. Cervicomedullary junction and pituitary are within normal limits. Normal supratentorial gray and white matter signal. Deep gray matter nuclei and brainstem are within normal limits. There may also be a small chronic right hemisphere cerebellar infarct (series 5, image 8). No chronic cerebral blood products identified. Visible internal auditory structures appear normal. Mastoids and paranasal sinuses are clear. Postoperative changes to the globes, otherwise negative orbit and scalp soft tissues. Normal bone marrow signal. Negative visualized cervical spine. IMPRESSION: 1.  No acute intracranial abnormality. 2. Moderate size chronic left cerebellar infarct. Electronically Signed   By: Genevie Ann M.D.   On: 02/01/2016 07:19     Assessment/Plan: MSSA bacteremia, complicated = repeat blood cx grew CoNs, likely contaminant given patient's skin condition. Recommendations unchanged. Continue with cefazolin2gm Q 8 treatfor 4 wk using 5/16 as day 1. Will repeat blood cx today. 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 Consider atarax prn to minimize symptoms of pruritis  Severe protein-calorie Malnutrition = recommend for her to continue to keep good oral intake   Sharon Velasquez, [facility] for Infectious Diseases Cell: [phone] Pager: [phone]  02/02/2016, 4:53 PM

## 2016-02-02 NOTE — Progress Notes (Signed)
Per daughter- patient was able to do her own ADLs 2 years ago. Was at rehab facility trying to improve strength. Daughter wishes to for her to return to rehab facility. Daughter has hoyer lift and hospital bed at home.

## 2016-02-02 NOTE — Care Management Important Message (Signed)
Important Message  Patient Details  Name: Sharon Velasquez MRN: [id number] Date of Birth: August 20, 1931   Medicare Important Message Given:  Yes    Zenon Mayo, RN 02/02/2016, 3:54 [place] Message  Patient Details  Name: Sharon Velasquez MRN: [id number] Date of Birth: 1931/02/03   Medicare Important Message Given:  Yes    Zenon Mayo, RN 02/02/2016, 3:54 PM

## 2016-02-02 NOTE — Progress Notes (Signed)
PROGRESS NOTE    Sharon Velasquez  NWG:[id number] DOB: [birth year] DOA: 01/30/2016 PCP: No primary care provider on file.   Outpatient Specialists:     Brief Narrative:  Patient was admitted on 01/30/2016, with complaint of speech difficulty, was found to have MSSA bacteremia. Currently further plan is continue further workup for acute encephalopathy as well as MSSA bacteremia.  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   Assessment & Plan:   Principal Problem:   MSSA (methicillin susceptible Staphylococcus aureus) infection Active Problems:   Hypertension   Malignant neoplasm of female breast ([place])   Erythematous condition   Morbid (severe) obesity due to excess calories (HCC)   GERD (gastroesophageal reflux disease)   Cancer (HCC)   UTI (lower urinary tract infection)   Hypernatremia   Acute encephalopathy   MSSA (methicillin susceptible Staphylococcus aureus) infection Suspected UTI. 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 Blood cultures are positive for MSSA. Antibiotic changed to cefazolin. Appreciate input from infectious disease- Dr. Baxter Flattery: will recommend to treat for 4 weeks as complicated bacteremia, defer getting TEE due to AMS, dementia, risk of aspiration ESR CRP markedly elevated.  Acute encephalopathy.  CT scan unremarkable. Has significant hypernatremia likely evidence of dehydration.  MRI of the brain shows old stroke PT/OT and speech evaluation: Speech evaluation recommend dysphagia type II diet. 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 Consultation mentions that the patient may have atypical Stevens-Johnson syndrome but later on discharge summary mentions this is less likely  Stevens-Johnson syndrome and therefore patient was treated with high-dose steroids and improved. As per patient of this condition is worsening again after discharge home. Less likely Stevens-Johnson syndrome therefore we will continue to monitor the patient at [facility] instead of transfer. No mucosal membrane ulceration, Nikolsky negative  Essential hypertension. Holding blood pressure medication in the setting of borderline blood pressure. Cortisol normal.  Hypoglycemia -was not eating well - monitor off D5  History of breast cancer. Continue home regimen.  Hypothermia. Most likely the setting of infection/hypoglycemia. Bair hugger d/c'd  Hypokalemia -replete  DVT prophylaxis:  SQ Heparin  Code Status: Full Code   Family Communication:   Disposition Plan:  From SNF (most recently) plan to return there   Consultants:   ID  Procedures:        Subjective: No SOB, no CP  Objective: Filed Vitals:   02/02/16 1048 02/02/16 1049 02/02/16 1149 02/02/16 1150  BP: 97/55   104/49  Pulse: 93  91 92  Temp: 98.8 F (37.1 C)  98.8 F (37.1 C) 98.8 F (37.1 C)  TempSrc: Rectal  Rectal Rectal  Resp: _0 Height:      Weight:      SpO2: 81% 94% 92% 96%    Intake/Output Summary (Last 24 hours) at 02/02/16 1318 Last data filed at 02/02/16 1135  Gross per 24 hour  Intake   3715 ml  Output      1 ml  Net   3714 ml   Filed Weights   01/30/16 1854  Weight: 110.7 kg (244 lb 0.8 oz)    Examination:  General exam: Appears calm and comfortable - NAD Respiratory system: Coarse breath sounds that cleared with cough. Cardiovascular system: S1 & S2 heard, RRR.  No JVD, murmurs, rubs, gallops or clicks. No pedal edema. Gastrointestinal system: Abdomen is nondistended, soft and nontender. No organomegaly or masses felt. Normal bowel sounds heard. Extremities: Symmetric 5 x 5 power. Skin: peeling Psychiatry:  Confused about living situtation      Data Reviewed: I have personally reviewed following labs and imaging studies  CBC:  Recent Labs Lab 01/30/16 1438 01/30/16 1449 01/31/16 1012 02/01/16 0435 02/02/16 0315  WBC  --  7.2 5.9 6.4 7.8  NEUTROABS  --  4.4 3.3 2.6  --   HGB 15.3* 12.9 10.8* 10.7* 10.6*  HCT 45.0 40.3 33.6* 34.7* 32.9*  MCV  --  85.2 86.8 86.1 84.4  PLT  --  148* 122* 135* 098*   Basic Metabolic Panel:  Recent Labs Lab 01/30/16 1449 01/31/16 0735 01/31/16 1012 02/01/16 0435 02/02/16 0315  NA 151* 153* 149* 150* 144  K 4.3 7.0* 5.0 3.2* 4.0  CL 115* 122* 124* 118* 113*  CO2 24 14* 15* 24 21*  GLUCOSE 77 47* 58* 80 90  BUN 24* 24* 23* 19 14  CREATININE 1.18* 1.23* 1.07* 1.07* 1.03*  CALCIUM 9.1 8.0* 7.6* 7.9* 7.9*  MG  --   --   --  2.1  --    GFR: Estimated Creatinine Clearance: 49.5 mL/min (by C-G formula based on Cr of 1.03). Liver Function Tests:  Recent Labs Lab 01/30/16 1449 02/01/16 0435  AST 80* 62*  ALT 77* 53  ALKPHOS 166* 124  BILITOT 0.4 0.2*  PROT 6.6 5.3*  ALBUMIN 2.7* 2.0*   No results for input(s): LIPASE, AMYLASE in the last 168 hours. No results for input(s): AMMONIA in the last 168 hours. Coagulation Profile:  Recent Labs Lab 02/01/16 0435  INR 1.25   Cardiac Enzymes:  Recent Labs Lab 01/30/16 1449 01/30/16 1921 01/31/16 0133 01/31/16 0735  TROPONINI 0.04* <0.03 <0.03 0.03   BNP (last 3 results) No results for input(s): PROBNP in the last 8760 hours. HbA1C: No results for input(s): HGBA1C in the last 72 hours. CBG:  Recent Labs Lab 02/01/16 1057 02/01/16 1744 02/01/16 2347 02/02/16 0501 02/02/16 1156  GLUCAP 95 97 103* 77 89   Lipid Profile: No results for input(s): CHOL, HDL, LDLCALC, TRIG, CHOLHDL, LDLDIRECT in the last 72 hours. Thyroid Function Tests:  Recent Labs  01/31/16 1217  TSH 4.644*   Anemia Panel: No results for input(s): VITAMINB12, FOLATE, FERRITIN, TIBC, IRON, RETICCTPCT in the last 72 hours. Urine  analysis:    Component Value Date/Time   COLORURINE YELLOW 01/30/2016 1359   APPEARANCEUR CLOUDY* 01/30/2016 1359   LABSPEC 1.023 01/30/2016 1359   PHURINE 5.0 01/30/2016 1359   GLUCOSEU NEGATIVE 01/30/2016 1359   HGBUR NEGATIVE 01/30/2016 1359   BILIRUBINUR SMALL* 01/30/2016 1359   KETONESUR 15* 01/30/2016 1359   PROTEINUR NEGATIVE 01/30/2016 1359   NITRITE NEGATIVE 01/30/2016 1359   LEUKOCYTESUR SMALL* 01/30/2016 1359    Recent Results (from the past 240 hour(s))  Urine culture     Status: Abnormal   Collection Time: 01/30/16  1:59 PM  Result Value Ref Range Status   Specimen Description URINE, CLEAN CATCH  Final   Special Requests Normal  Final   Culture MULTIPLE SPECIES PRESENT, SUGGEST RECOLLECTION (A)  Final   Report Status 01/31/2016 FINAL  Final  Culture, blood (routine x 2)     Status: None (Preliminary result)   Collection Time: 01/30/16  2:15 PM  Result Value Ref Range Status   Specimen Description BLOOD RIGHT ANTECUBITAL  Final   Special Requests   Final    BOTTLES DRAWN AEROBIC AND ANAEROBIC 10CC AER 5CC ANA   Culture NO GROWTH 2 DAYS  Final   Report Status PENDING  Incomplete  Culture, blood (routine x 2)     Status: Abnormal   Collection Time: 01/30/16  2:20 PM  Result Value Ref Range Status   Specimen Description BLOOD RIGHT HAND  Final   Special Requests BOTTLES DRAWN AEROBIC ONLY [place]  Final   Culture  Setup Time   Final    GRAM POSITIVE COCCI IN CLUSTERS AEROBIC BOTTLE ONLY Organism ID to follow CRITICAL RESULT CALLED TO, READ BACK BY AND VERIFIED WITH: J. Darleen Crocker D AT 0809 ON [id number] BY Rhea Bleacher    Culture STAPHYLOCOCCUS AUREUS (A)  Final   Report Status 02/02/2016 FINAL  Final   Organism ID, Bacteria STAPHYLOCOCCUS AUREUS  Final      Susceptibility   Staphylococcus aureus - MIC*    CIPROFLOXACIN <=0.5 SENSITIVE Sensitive     ERYTHROMYCIN 0.5 SENSITIVE Sensitive     GENTAMICIN <=0.5 SENSITIVE Sensitive     OXACILLIN 0.5 SENSITIVE Sensitive      TETRACYCLINE <=1 SENSITIVE Sensitive     VANCOMYCIN <=0.5 SENSITIVE Sensitive     TRIMETH/SULFA <=10 SENSITIVE Sensitive     CLINDAMYCIN <=0.25 SENSITIVE Sensitive     RIFAMPIN <=0.5 SENSITIVE Sensitive     Inducible Clindamycin NEGATIVE Sensitive     * STAPHYLOCOCCUS AUREUS  Blood Culture ID Panel (Reflexed)     Status: Abnormal   Collection Time: 01/30/16  2:20 PM  Result Value Ref Range Status   Enterococcus species NOT DETECTED NOT DETECTED Final   Vancomycin resistance NOT DETECTED NOT DETECTED Final   Listeria monocytogenes NOT DETECTED NOT DETECTED Final   Staphylococcus species NOT DETECTED NOT DETECTED Final   Staphylococcus aureus DETECTED (A) NOT DETECTED Final    Comment: CRITICAL RESULT CALLED TO, READ BACK BY AND VERIFIED WITH: J. Darleen Crocker D AT 0809 ON [id number] BY S. YARBROUGH    Methicillin resistance NOT DETECTED NOT DETECTED Final   Streptococcus species NOT DETECTED NOT DETECTED Final   Streptococcus agalactiae NOT DETECTED NOT DETECTED Final   Streptococcus pneumoniae NOT DETECTED NOT DETECTED Final   Streptococcus pyogenes NOT DETECTED NOT DETECTED Final   Acinetobacter baumannii NOT DETECTED NOT DETECTED Final   Enterobacteriaceae species NOT DETECTED NOT DETECTED Final   Enterobacter cloacae complex NOT DETECTED NOT DETECTED Final   Escherichia coli NOT DETECTED NOT DETECTED Final   Klebsiella oxytoca NOT DETECTED NOT DETECTED Final   Klebsiella pneumoniae NOT DETECTED NOT DETECTED Final   Proteus species NOT DETECTED NOT DETECTED Final   Serratia marcescens NOT DETECTED NOT DETECTED Final   Carbapenem resistance NOT DETECTED NOT DETECTED Final   Haemophilus influenzae NOT DETECTED NOT DETECTED Final   Neisseria meningitidis NOT DETECTED NOT DETECTED Final   Pseudomonas aeruginosa NOT DETECTED NOT DETECTED Final   Candida albicans NOT DETECTED NOT DETECTED Final   Candida glabrata NOT DETECTED NOT DETECTED Final   Candida krusei NOT DETECTED NOT  DETECTED Final   Candida parapsilosis NOT DETECTED NOT DETECTED Final   Candida tropicalis NOT DETECTED NOT DETECTED Final  MRSA PCR Screening     Status: None   Collection Time: 01/30/16  6:56 PM  Result Value Ref Range Status   MRSA by PCR NEGATIVE NEGATIVE Final    Comment:        The GeneXpert MRSA Assay (FDA approved for NASAL  specimens only), is one component of a comprehensive MRSA colonization surveillance program. It is not intended to diagnose MRSA infection nor to guide or monitor treatment for MRSA infections.   Culture, blood (routine x 2)     Status: Abnormal (Preliminary result)   Collection Time: 01/31/16 10:35 AM  Result Value Ref Range Status   Specimen Description BLOOD RIGHT HAND  Final   Special Requests IN PEDIATRIC BOTTLE 2CC  Final   Culture  Setup Time   Final    GRAM POSITIVE COCCI IN CLUSTERS IN PEDIATRIC BOTTLE CRITICAL RESULT CALLED TO, READ BACK BY AND VERIFIED WITH: C. STEWART, PHARM D AT 0810 ON [id number] BY Rhea Bleacher    Culture (A)  Final    STAPHYLOCOCCUS SPECIES (COAGULASE NEGATIVE) THE SIGNIFICANCE OF ISOLATING THIS ORGANISM FROM A SINGLE SET OF BLOOD CULTURES WHEN MULTIPLE SETS ARE DRAWN IS UNCERTAIN. PLEASE NOTIFY THE MICROBIOLOGY DEPARTMENT WITHIN ONE WEEK IF SPECIATION AND SENSITIVITIES ARE REQUIRED.    Report Status PENDING  Incomplete  Culture, blood (routine x 2)     Status: None (Preliminary result)   Collection Time: 01/31/16 12:07 PM  Result Value Ref Range Status   Specimen Description BLOOD LEFT HAND  Final   Special Requests IN PEDIATRIC BOTTLE 1CC  Final   Culture NO GROWTH 1 DAY  Final   Report Status PENDING  Incomplete      Anti-infectives    Start     Dose/Rate Route Frequency Ordered Stop   01/31/16 1500  cefTRIAXone (ROCEPHIN) 1 g in dextrose 5 % 50 mL IVPB  Status:  Discontinued     1 g 100 mL/hr over 30 Minutes Intravenous Every 24 hours 01/30/16 1853 01/31/16 0832   01/31/16 0845  ceFAZolin (ANCEF) IVPB 2g/100  mL premix     2 g 200 mL/hr over 30 Minutes Intravenous Every 8 hours 01/31/16 0832     01/30/16 1530  cefTRIAXone (ROCEPHIN) 1 g in dextrose 5 % 50 mL IVPB     1 g 100 mL/hr over 30 Minutes Intravenous  Once 01/30/16 1524 01/30/16 1634       Radiology Studies: Mr Brain Wo Contrast  02/01/2016  CLINICAL DATA:  82 year old female with weakness, fatigue, altered mental status, slurred speech. Left facial weakness. 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 Initial encounter. EXAM: MRI HEAD WITHOUT CONTRAST TECHNIQUE: Multiplanar, multiecho pulse sequences of the brain and surrounding structures were obtained without intravenous contrast. COMPARISON:  Head CT without contrast 01/30/2016. [facility] portable chest radiograph 12/27/2015. FINDINGS: Moderate-sized chronic left cerebellar encephalomalacia re- demonstrated. No supratentorial encephalomalacia identified. No restricted diffusion to suggest acute infarction. No midline shift, mass effect, evidence of mass lesion, ventriculomegaly, extra-axial collection or acute intracranial hemorrhage. Cervicomedullary junction and pituitary are within normal limits. Normal supratentorial gray and white matter signal. Deep gray matter nuclei and brainstem are within normal limits. There may also be a small chronic right hemisphere cerebellar infarct (series 5, image 8). No chronic cerebral blood products identified. Visible internal auditory structures appear normal. Mastoids and paranasal sinuses are clear. Postoperative changes to the globes, otherwise negative orbit and scalp soft tissues. Normal bone marrow signal. Negative visualized cervical spine. IMPRESSION: 1.  No acute intracranial abnormality. 2. Moderate size chronic left cerebellar infarct. Electronically Signed   By: Genevie Ann M.D.   On: 02/01/2016 07:19        Scheduled Meds: . antiseptic oral rinse  7 mL Mouth Rinse BID  . aspirin  81 mg Oral Daily  .  ceFAZolin (ANCEF) IV  2 g Intravenous Q8H  . exemestane  25 mg Oral Daily  . feeding supplement (ENSURE ENLIVE)  237 mL Oral BID BM  . ferrous sulfate  325 mg Oral Q breakfast  . folic acid  1 mg Oral Daily  . heparin  5,000 Units Subcutaneous Q8H  . hydrOXYzine  25 mg Oral TID  . lactobacillus acidophilus  2 tablet Oral Daily  . loratadine  10 mg Oral Daily  . multivitamin with minerals  1 tablet Oral Daily  . pantoprazole  40 mg Oral Daily  . sodium chloride flush  10-40 mL Intracatheter Q12H   Continuous Infusions:     LOS: 3 days    Time spent: 35 min    [place], DO [facility] Pager [phone]  If 7PM-7AM, please contact night-coverage [email or web address] Password [email or web address] 02/02/2016, 1:18 PM

## 2016-02-03 DIAGNOSIS — C50919 Malignant neoplasm of unspecified site of unspecified female breast: Secondary | ICD-10-CM

## 2016-02-03 DIAGNOSIS — G9340 Encephalopathy, unspecified: Secondary | ICD-10-CM

## 2016-02-03 DIAGNOSIS — E440 Moderate protein-calorie malnutrition: Secondary | ICD-10-CM

## 2016-02-03 DIAGNOSIS — L539 Erythematous condition, unspecified: Secondary | ICD-10-CM

## 2016-02-03 LAB — GLUCOSE, CAPILLARY
Glucose-Capillary: 76 mg/dL (ref 65–99)
Glucose-Capillary: 94 mg/dL (ref 65–99)
Glucose-Capillary: 85 mg/dL (ref 65–99)

## 2016-02-03 NOTE — Progress Notes (Addendum)
PROGRESS NOTE    Sharon Velasquez  MGQ:[id number] DOB: 07-17-1931 DOA: 01/30/2016 PCP: No primary care provider on file.   Outpatient Specialists:     Brief Narrative:  Patient was admitted on 01/30/2016, with complaint of speech difficulty, was found to have MSSA bacteremia. Currently further plan is continue further workup for acute encephalopathy as well as MSSA bacteremia.  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   Assessment & Plan:   Principal Problem:   MSSA (methicillin susceptible Staphylococcus aureus) infection Active Problems:   Hypertension   Malignant neoplasm of female breast ([place])   Erythematous condition   Morbid (severe) obesity due to excess calories (HCC)   GERD (gastroesophageal reflux disease)   Cancer (HCC)   UTI (lower urinary tract infection)   Hypernatremia   Acute encephalopathy   Malnutrition of moderate degree   MSSA (methicillin susceptible Staphylococcus aureus) infection Suspected UTI. 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 Blood cultures are positive for MSSA. Antibiotic changed to cefazolin. Appreciate input from infectious disease- Dr. Baxter Flattery: will recommend to treat for 4 weeks as complicated bacteremia, defer getting TEE due to AMS, dementia, risk of aspiration--- weekly CMP, CBC with diff ESR CRP markedly elevated.  Acute encephalopathy.  CT scan unremarkable. Has significant hypernatremia likely evidence of dehydration.  MRI of the brain shows old stroke PT/OT and speech evaluation: Speech evaluation recommend dysphagia type II diet. 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 Consultation mentions that the patient may have atypical Stevens-Johnson  syndrome but later on discharge summary mentions this is less likely Stevens-Johnson syndrome and therefore patient was treated with high-dose steroids and improved. As per patient of this condition is worsening again after discharge home. Less likely Stevens-Johnson syndrome therefore we will continue to monitor the patient at [facility] instead of transfer. No mucosal membrane ulceration, Nikolsky negative  Essential hypertension. Holding blood pressure medication in the setting of borderline blood pressure. Cortisol normal.  Hypoglycemia -was not eating well - monitor off D5  History of breast cancer. Continue home regimen.  Hypothermia. Most likely the setting of infection/hypoglycemia. Bair hugger d/c'd  Hypokalemia -replete  Hypotension -check AM cortisol, may nee IV stress dose steroids   DVT prophylaxis:  SQ Heparin  Code Status: Full Code   Family Communication: Daughter 5/18  Disposition Plan:  From SNF (most recently) plan to return there 5/22? tx to floor  Consultants:   ID  Procedures:        Subjective: No SOB, no CP  Objective: Filed Vitals:   02/03/16 0500 02/03/16 0739 02/03/16 1200 02/03/16 1300  BP:  108/64 96/59   Pulse:  82 75   Temp: 96.3 F (35.7 C) 97.8 F (36.6 C)  99.1 F (37.3 C)  TempSrc:  Rectal  Rectal  Resp:  20 12   Height:      Weight:      SpO2:  100% 100%     Intake/Output Summary (Last 24 hours) at 02/03/16 1454 Last data filed at 02/03/16 1406  Gross per 24 hour  Intake    240 ml  Output      0 ml  Net    240 ml   Filed Weights   01/30/16 1854  Weight: 110.7 kg (244 lb 0.8 oz)  Examination:  General exam: Appears calm and comfortable - NAD Respiratory system: Coarse breath sounds that cleared with cough. Cardiovascular system: S1 & S2 heard, RRR. No JVD, murmurs, rubs, gallops or clicks. No pedal edema. Gastrointestinal system: Abdomen is nondistended, soft and nontender. No  organomegaly or masses felt. Normal bowel sounds heard. Extremities: Symmetric 5 x 5 power. Skin: peeling Psychiatry:  Confused about living situtation     Data Reviewed: I have personally reviewed following labs and imaging studies  CBC:  Recent Labs Lab 01/30/16 1438 01/30/16 1449 01/31/16 1012 02/01/16 0435 02/02/16 0315  WBC  --  7.2 5.9 6.4 7.8  NEUTROABS  --  4.4 3.3 2.6  --   HGB 15.3* 12.9 10.8* 10.7* 10.6*  HCT 45.0 40.3 33.6* 34.7* 32.9*  MCV  --  85.2 86.8 86.1 84.4  PLT  --  148* 122* 135* 735*   Basic Metabolic Panel:  Recent Labs Lab 01/30/16 1449 01/31/16 0735 01/31/16 1012 02/01/16 0435 02/02/16 0315  NA 151* 153* 149* 150* 144  K 4.3 7.0* 5.0 3.2* 4.0  CL 115* 122* 124* 118* 113*  CO2 24 14* 15* 24 21*  GLUCOSE 77 47* 58* 80 90  BUN 24* 24* 23* 19 14  CREATININE 1.18* 1.23* 1.07* 1.07* 1.03*  CALCIUM 9.1 8.0* 7.6* 7.9* 7.9*  MG  --   --   --  2.1  --    GFR: Estimated Creatinine Clearance: 49.5 mL/min (by C-G formula based on Cr of 1.03). Liver Function Tests:  Recent Labs Lab 01/30/16 1449 02/01/16 0435  AST 80* 62*  ALT 77* 53  ALKPHOS 166* 124  BILITOT 0.4 0.2*  PROT 6.6 5.3*  ALBUMIN 2.7* 2.0*   No results for input(s): LIPASE, AMYLASE in the last 168 hours. No results for input(s): AMMONIA in the last 168 hours. Coagulation Profile:  Recent Labs Lab 02/01/16 0435  INR 1.25   Cardiac Enzymes:  Recent Labs Lab 01/30/16 1449 01/30/16 1921 01/31/16 0133 01/31/16 0735  TROPONINI 0.04* <0.03 <0.03 0.03   BNP (last 3 results) No results for input(s): PROBNP in the last 8760 hours. HbA1C: No results for input(s): HGBA1C in the last 72 hours. CBG:  Recent Labs Lab 02/02/16 1156 02/02/16 1813 02/02/16 2310 02/03/16 0459 02/03/16 1149  GLUCAP 89 80 93 76 94   Lipid Profile: No results for input(s): CHOL, HDL, LDLCALC, TRIG, CHOLHDL, LDLDIRECT in the last 72 hours. Thyroid Function Tests: No results for  input(s): TSH, T4TOTAL, FREET4, T3FREE, THYROIDAB in the last 72 hours. Anemia Panel: No results for input(s): VITAMINB12, FOLATE, FERRITIN, TIBC, IRON, RETICCTPCT in the last 72 hours. Urine analysis:    Component Value Date/Time   COLORURINE YELLOW 01/30/2016 1359   APPEARANCEUR CLOUDY* 01/30/2016 1359   LABSPEC 1.023 01/30/2016 1359   PHURINE 5.0 01/30/2016 1359   GLUCOSEU NEGATIVE 01/30/2016 1359   HGBUR NEGATIVE 01/30/2016 1359   BILIRUBINUR SMALL* 01/30/2016 1359   KETONESUR 15* 01/30/2016 1359   PROTEINUR NEGATIVE 01/30/2016 1359   NITRITE NEGATIVE 01/30/2016 1359   LEUKOCYTESUR SMALL* 01/30/2016 1359    Recent Results (from the past 240 hour(s))  Urine culture     Status: Abnormal   Collection Time: 01/30/16  1:59 PM  Result Value Ref Range Status   Specimen Description URINE, CLEAN CATCH  Final   Special Requests Normal  Final   Culture MULTIPLE SPECIES PRESENT, SUGGEST RECOLLECTION (A)  Final   Report Status 01/31/2016 FINAL  Final  Culture, blood (routine x 2)  Status: None (Preliminary result)   Collection Time: 01/30/16  2:15 PM  Result Value Ref Range Status   Specimen Description BLOOD RIGHT ANTECUBITAL  Final   Special Requests   Final    BOTTLES DRAWN AEROBIC AND ANAEROBIC 10CC AER 5CC ANA   Culture NO GROWTH 4 DAYS  Final   Report Status PENDING  Incomplete  Culture, blood (routine x 2)     Status: Abnormal   Collection Time: 01/30/16  2:20 PM  Result Value Ref Range Status   Specimen Description BLOOD RIGHT HAND  Final   Special Requests BOTTLES DRAWN AEROBIC ONLY [place]  Final   Culture  Setup Time   Final    GRAM POSITIVE COCCI IN CLUSTERS AEROBIC BOTTLE ONLY Organism ID to follow CRITICAL RESULT CALLED TO, READ BACK BY AND VERIFIED WITH: J. Darleen Crocker D AT 0809 ON [id number] BY Rhea Bleacher    Culture STAPHYLOCOCCUS AUREUS (A)  Final   Report Status 02/02/2016 FINAL  Final   Organism ID, Bacteria STAPHYLOCOCCUS AUREUS  Final      Susceptibility    Staphylococcus aureus - MIC*    CIPROFLOXACIN <=0.5 SENSITIVE Sensitive     ERYTHROMYCIN 0.5 SENSITIVE Sensitive     GENTAMICIN <=0.5 SENSITIVE Sensitive     OXACILLIN 0.5 SENSITIVE Sensitive     TETRACYCLINE <=1 SENSITIVE Sensitive     VANCOMYCIN <=0.5 SENSITIVE Sensitive     TRIMETH/SULFA <=10 SENSITIVE Sensitive     CLINDAMYCIN <=0.25 SENSITIVE Sensitive     RIFAMPIN <=0.5 SENSITIVE Sensitive     Inducible Clindamycin NEGATIVE Sensitive     * STAPHYLOCOCCUS AUREUS  Blood Culture ID Panel (Reflexed)     Status: Abnormal   Collection Time: 01/30/16  2:20 PM  Result Value Ref Range Status   Enterococcus species NOT DETECTED NOT DETECTED Final   Vancomycin resistance NOT DETECTED NOT DETECTED Final   Listeria monocytogenes NOT DETECTED NOT DETECTED Final   Staphylococcus species NOT DETECTED NOT DETECTED Final   Staphylococcus aureus DETECTED (A) NOT DETECTED Final    Comment: CRITICAL RESULT CALLED TO, READ BACK BY AND VERIFIED WITH: J. Darleen Crocker D AT 0809 ON [id number] BY S. YARBROUGH    Methicillin resistance NOT DETECTED NOT DETECTED Final   Streptococcus species NOT DETECTED NOT DETECTED Final   Streptococcus agalactiae NOT DETECTED NOT DETECTED Final   Streptococcus pneumoniae NOT DETECTED NOT DETECTED Final   Streptococcus pyogenes NOT DETECTED NOT DETECTED Final   Acinetobacter baumannii NOT DETECTED NOT DETECTED Final   Enterobacteriaceae species NOT DETECTED NOT DETECTED Final   Enterobacter cloacae complex NOT DETECTED NOT DETECTED Final   Escherichia coli NOT DETECTED NOT DETECTED Final   Klebsiella oxytoca NOT DETECTED NOT DETECTED Final   Klebsiella pneumoniae NOT DETECTED NOT DETECTED Final   Proteus species NOT DETECTED NOT DETECTED Final   Serratia marcescens NOT DETECTED NOT DETECTED Final   Carbapenem resistance NOT DETECTED NOT DETECTED Final   Haemophilus influenzae NOT DETECTED NOT DETECTED Final   Neisseria meningitidis NOT DETECTED NOT DETECTED Final    Pseudomonas aeruginosa NOT DETECTED NOT DETECTED Final   Candida albicans NOT DETECTED NOT DETECTED Final   Candida glabrata NOT DETECTED NOT DETECTED Final   Candida krusei NOT DETECTED NOT DETECTED Final   Candida parapsilosis NOT DETECTED NOT DETECTED Final   Candida tropicalis NOT DETECTED NOT DETECTED Final  MRSA PCR Screening     Status: None   Collection Time: 01/30/16  6:56 PM  Result Value Ref Range Status  MRSA by PCR NEGATIVE NEGATIVE Final    Comment:        The GeneXpert MRSA Assay (FDA approved for NASAL specimens only), is one component of a comprehensive MRSA colonization surveillance program. It is not intended to diagnose MRSA infection nor to guide or monitor treatment for MRSA infections.   Culture, blood (routine x 2)     Status: Abnormal (Preliminary result)   Collection Time: 01/31/16 10:35 AM  Result Value Ref Range Status   Specimen Description BLOOD RIGHT HAND  Final   Special Requests IN PEDIATRIC BOTTLE 2CC  Final   Culture  Setup Time   Final    GRAM POSITIVE COCCI IN CLUSTERS IN PEDIATRIC BOTTLE CRITICAL RESULT CALLED TO, READ BACK BY AND VERIFIED WITH: C. STEWART, PHARM D AT 0810 ON [id number] BY Rhea Bleacher    Culture (A)  Final    STAPHYLOCOCCUS SPECIES (COAGULASE NEGATIVE) THE SIGNIFICANCE OF ISOLATING THIS ORGANISM FROM A SINGLE SET OF BLOOD CULTURES WHEN MULTIPLE SETS ARE DRAWN IS UNCERTAIN. PLEASE NOTIFY THE MICROBIOLOGY DEPARTMENT WITHIN ONE WEEK IF SPECIATION AND SENSITIVITIES ARE REQUIRED.    Report Status PENDING  Incomplete  Culture, blood (routine x 2)     Status: None (Preliminary result)   Collection Time: 01/31/16 12:07 PM  Result Value Ref Range Status   Specimen Description BLOOD LEFT HAND  Final   Special Requests IN PEDIATRIC BOTTLE 1CC  Final   Culture NO GROWTH 3 DAYS  Final   Report Status PENDING  Incomplete      Anti-infectives    Start     Dose/Rate Route Frequency Ordered Stop   01/31/16 1500  cefTRIAXone  (ROCEPHIN) 1 g in dextrose 5 % 50 mL IVPB  Status:  Discontinued     1 g 100 mL/hr over 30 Minutes Intravenous Every 24 hours 01/30/16 1853 01/31/16 0832   01/31/16 0845  ceFAZolin (ANCEF) IVPB 2g/100 mL premix     2 g 200 mL/hr over 30 Minutes Intravenous Every 8 hours 01/31/16 0832     01/30/16 1530  cefTRIAXone (ROCEPHIN) 1 g in dextrose 5 % 50 mL IVPB     1 g 100 mL/hr over 30 Minutes Intravenous  Once 01/30/16 1524 01/30/16 1634       Radiology Studies: No results found.      Scheduled Meds: . antiseptic oral rinse  7 mL Mouth Rinse BID  . aspirin  81 mg Oral Daily  .  ceFAZolin (ANCEF) IV  2 g Intravenous Q8H  . exemestane  25 mg Oral Daily  . feeding supplement (ENSURE ENLIVE)  237 mL Oral BID BM  . ferrous sulfate  325 mg Oral Q breakfast  . folic acid  1 mg Oral Daily  . heparin  5,000 Units Subcutaneous Q8H  . hydrOXYzine  25 mg Oral TID  . lactobacillus acidophilus  2 tablet Oral Daily  . loratadine  10 mg Oral Daily  . multivitamin with minerals  1 tablet Oral Daily  . pantoprazole  40 mg Oral Daily  . sodium chloride flush  10-40 mL Intracatheter Q12H   Continuous Infusions:     LOS: 4 days    Time spent: 35 min    [place], DO [facility] Pager [phone]  If 7PM-7AM, please contact night-coverage [email or web address] Password [email or web address] 02/03/2016, 2:54 PM

## 2016-02-03 NOTE — Progress Notes (Signed)
[facility] for Infectious Disease    Date of Admission:  01/30/2016   Total days of antibiotics 5        Day 4 cefazolin           ID: Sharon Velasquez is a 82 y.o. female with  MSSA bacteremia Principal Problem:   MSSA (methicillin susceptible Staphylococcus aureus) infection Active Problems:   Hypertension   Malignant neoplasm of female breast ([place])   Erythematous condition   Morbid (severe) obesity due to excess calories (HCC)   GERD (gastroesophageal reflux disease)   Cancer (HCC)   UTI (lower urinary tract infection)   Hypernatremia   Acute encephalopathy   Malnutrition of moderate degree    Subjective: Afebrile, more alert today.  Medications:  . antiseptic oral rinse  7 mL Mouth Rinse BID  . aspirin  81 mg Oral Daily  .  ceFAZolin (ANCEF) IV  2 g Intravenous Q8H  . exemestane  25 mg Oral Daily  . feeding supplement (ENSURE ENLIVE)  237 mL Oral BID BM  . ferrous sulfate  325 mg Oral Q breakfast  . folic acid  1 mg Oral Daily  . heparin  5,000 Units Subcutaneous Q8H  . hydrOXYzine  25 mg Oral TID  . lactobacillus acidophilus  2 tablet Oral Daily  . loratadine  10 mg Oral Daily  . multivitamin with minerals  1 tablet Oral Daily  . pantoprazole  40 mg Oral Daily  . sodium chloride flush  10-40 mL Intracatheter Q12H    Objective: Vital signs in last 24 hours: Temp:  [96.3 F (35.7 C)-99.1 F (37.3 C)] 99.1 F (37.3 C) (05/19 1300) Pulse Rate:  [75-92] 75 (05/19 1200) Resp:  [12-22] 12 (05/19 1200) BP: (96-118)/(47-72) 96/59 mmHg (05/19 1200) SpO2:  [90 %-100 %] 100 % (05/19 1200) Physical Exam  Constitutional:  oriented to person, place . appears well-developed and well-nourished. No distress. Sitting in bed watching tv HENT: [place]/AT, PERRLA, no scleral icterus Mouth/Throat: Oropharynx is clear and moist. No oropharyngeal exudate.  Cardiovascular: Normal rate, regular rhythm and normal heart sounds. Exam reveals no gallop and no friction rub.  No  murmur heard.  Pulmonary/Chest: Effort normal and breath sounds normal. No respiratory distress.  has no wheezes.  Neck = supple, no nuchal rigidity Abdominal: Soft. Bowel sounds are normal.  exhibits no distension. There is no tenderness.  Lymphadenopathy: no cervical adenopathy. No axillary adenopathy Neurological: alert and oriented to person, place, and time.  Ext: diffuse anasarca to upper extremities Skin: Skin is diffusiely dry with some excoriation marks to chest Psychiatric: a normal mood and affect.  behavior is normal.    Lab Results  Recent Labs  02/01/16 0435 02/02/16 0315  WBC 6.4 7.8  HGB 10.7* 10.6*  HCT 34.7* 32.9*  NA 150* 144  K 3.2* 4.0  CL 118* 113*  CO2 24 21*  BUN 19 14  CREATININE 1.07* 1.03*   Liver Panel  Recent Labs  02/01/16 0435  PROT 5.3*  ALBUMIN 2.0*  AST 62*  ALT 53  ALKPHOS 124  BILITOT 0.2*   Sedimentation Rate  Recent Labs  01/31/16 1800  ESRSEDRATE 60*   C-Reactive Protein No results for input(s): CRP in the last 72 hours.  Microbiology: 5/15 1 of 4 blood cx MSSA 5/16 blood cx 1 et CoNS Studies/Results: No results found.   Assessment/Plan: MSSA bacteremia, complicated = repeat blood cx grew CoNs, likely contaminant given patient's skin condition. Recommendations unchanged. Continue with cefazolin2gm  Q 8 treatfor 4 wk using 5/16 as day 1. Will repeat blood cx today. 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 Consider atarax prn to minimize symptoms of pruritis. Continue with follow up with dermatology  Severe protein-calorie Malnutrition = recommend for her to continue to keep good oral intake  Will sign off. Call if questions   Home health/SNF orders listed below  Diagnosis: Complicated staph aureus bacteremia  Culture Result:  MSSA  Allergies  Allergen Reactions  . Latex   . 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 [facility]    Discharge antibiotics: Per pharmacy protocol  Cefazolin 2gm IV q 8hr  Duration:  4 wk End Date: June 12th  [place] Per Protocol:  Labs weekly while on IV antibiotics: _x_ CBC with differential _x_ CMP   Fax weekly labs to [phone]  Needs to follow up with PCP in 1-2 wk    [place], [facility] for Infectious Diseases Cell: [phone] Pager: [phone]  02/03/2016, 2:24 PM

## 2016-02-03 NOTE — Clinical Social Work Note (Signed)
CSW spoke with Arby Barrette, admissions coordinator at [facility]. They will extend bed offer to patient.  [place] Scrape, [place]

## 2016-02-04 DIAGNOSIS — T68XXXA Hypothermia, initial encounter: Secondary | ICD-10-CM | POA: Insufficient documentation

## 2016-02-04 LAB — BASIC METABOLIC PANEL
Anion gap: 8 (ref 5–15)
BUN: 10 mg/dL (ref 6–20)
CALCIUM: 8.1 mg/dL — AB (ref 8.9–10.3)
CHLORIDE: 110 mmol/L (ref 101–111)
CO2: 24 mmol/L (ref 22–32)
CREATININE: 0.91 mg/dL (ref 0.44–1.00)
GFR calc Af Amer: >60 mL/min (ref >60)
GFR, EST NON AFRICAN AMERICAN: 57 mL/min — AB (ref >60)
GLUCOSE: 77 mg/dL (ref 65–99)
Potassium: 4.6 mmol/L (ref 3.5–5.1)
Sodium: 142 mmol/L (ref 135–145)

## 2016-02-04 LAB — CULTURE, BLOOD (ROUTINE X 2): Culture: NO GROWTH

## 2016-02-04 LAB — GLUCOSE, CAPILLARY
GLUCOSE-CAPILLARY: 73 mg/dL (ref 65–99)
GLUCOSE-CAPILLARY: 86 mg/dL (ref 65–99)
Glucose-Capillary: 80 mg/dL (ref 65–99)
Glucose-Capillary: 95 mg/dL (ref 65–99)

## 2016-02-04 LAB — CORTISOL-AM, BLOOD: CORTISOL - AM: 13.1 ug/dL (ref 6.7–22.6)

## 2016-02-04 LAB — CBC
HEMATOCRIT: 36.2 % (ref 36.0–46.0)
Hemoglobin: 11.3 g/dL — ABNORMAL LOW (ref 12.0–15.0)
MCH: 26 pg (ref 26.0–34.0)
MCHC: 31.2 g/dL (ref 30.0–36.0)
MCV: 83.2 fL (ref 78.0–100.0)
Platelets: 139 10*3/uL — ABNORMAL LOW (ref 150–400)
RBC: 4.35 MIL/uL (ref 3.87–5.11)
RDW: 18.9 % — AB (ref 11.5–15.5)
WBC: 7.6 10*3/uL (ref 4.0–10.5)

## 2016-02-04 NOTE — Progress Notes (Signed)
Daughter was in to visit mom. Dr. Eliseo Squires texted and she came right up but dtr could not stay.

## 2016-02-04 NOTE — Progress Notes (Signed)
PROGRESS NOTE    Sharon Velasquez  LYY:[id number] DOB: 05-21-1931 DOA: 01/30/2016 PCP: No primary care provider on file.   Outpatient Specialists:     Brief Narrative:  Patient was admitted on 01/30/2016, with complaint of speech difficulty, was found to have MSSA bacteremia. Currently further plan is continue further workup for acute encephalopathy as well as MSSA bacteremia.  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   Assessment & Plan:   Principal Problem:   MSSA (methicillin susceptible Staphylococcus aureus) infection Active Problems:   Hypertension   Malignant neoplasm of female breast ([place])   Erythematous condition   Morbid (severe) obesity due to excess calories (HCC)   GERD (gastroesophageal reflux disease)   Cancer (HCC)   UTI (lower urinary tract infection)   Hypernatremia   Acute encephalopathy   Malnutrition of moderate degree   Hypothermia   MSSA (methicillin susceptible Staphylococcus aureus) infection Suspected UTI. 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 Blood cultures are positive for MSSA. Antibiotic changed to cefazolin. Appreciate input from infectious disease- Dr. Baxter Flattery: will recommend to treat for 4 weeks as complicated bacteremia, defer getting TEE due to AMS, dementia, risk of aspiration--- weekly CMP, CBC with diff ESR CRP markedly elevated.  Acute encephalopathy.  CT scan unremarkable. Has significant hypernatremia likely evidence of dehydration.  MRI of the brain shows old stroke PT/OT and speech evaluation: Speech evaluation recommend dysphagia type II diet. 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 Consultation mentions that the patient may have atypical  Stevens-Johnson syndrome but later on discharge summary mentions this is less likely Stevens-Johnson syndrome and therefore patient was treated with high-dose steroids and improved. As per patient of this condition is worsening again after discharge home. Less likely Stevens-Johnson syndrome therefore we will continue to monitor the patient at [facility] instead of transfer. No mucosal membrane ulceration, Nikolsky negative  Essential hypertension. Holding blood pressure medication in the setting of borderline blood pressure. Cortisol normal.  EF 45 % -outpatient work up after bacteremia has cleared -no old echos to compare with  Hypoglycemia -was not eating well - monitor off D5  History of breast cancer. Continue home regimen.  Hypothermia. Most likely the setting of infection/hypoglycemia. Bair hugger d/c'd  Hypokalemia -replete  Hypotension - AM cortisol ok, resolved   DVT prophylaxis:  SQ Heparin  Code Status: Full Code   Family Communication: Daughter 5/18 -came to see daughter at bedside on 5/20 but she had left  Disposition Plan:  From SNF (most recently) plan to return there 5/22? tx to floor  Consultants:   ID  Procedures:        Subjective: Says she is feeling better  Objective: Filed Vitals:   02/03/16 1549 02/03/16 1730 02/03/16 2112 02/04/16 0543  BP: 100/54 121/67 113/53 118/60  Pulse: 85 87 96 88  Temp: 97.3 F (36.3 C) 97.5 F (36.4 C) 97.8 F (36.6 C) 97.6 F (36.4 C)  TempSrc: Oral Oral Oral Oral  Resp: _0 Height:      Weight:      SpO2: 100% 97% 93% 94%    Intake/Output Summary (Last 24 hours) at 02/04/16 1159 Last data filed at 02/03/16 1930  Gross per 24 hour  Intake    220 ml  Output      0 ml  Net    220 ml   Filed Weights   01/30/16 1854  Weight: 110.7 kg (244 lb 0.8 oz)    Examination:  General exam: Appears calm and comfortable - NAD Respiratory system: Coarse breath sounds that  cleared with cough. Cardiovascular system: S1 & S2 heard, RRR. No JVD, murmurs, rubs, gallops or clicks. No pedal edema. Gastrointestinal system: Abdomen is nondistended, soft and nontender. No organomegaly or masses felt. Normal bowel sounds heard. Extremities: Symmetric 5 x 5 power. Skin: less dry     Data Reviewed: I have personally reviewed following labs and imaging studies  CBC:  Recent Labs Lab 01/30/16 1449 01/31/16 1012 02/01/16 0435 02/02/16 0315 02/04/16 0410  WBC 7.2 5.9 6.4 7.8 7.6  NEUTROABS 4.4 3.3 2.6  --   --   HGB 12.9 10.8* 10.7* 10.6* 11.3*  HCT 40.3 33.6* 34.7* 32.9* 36.2  MCV 85.2 86.8 86.1 84.4 83.2  PLT 148* 122* 135* 140* 829*   Basic Metabolic Panel:  Recent Labs Lab 01/31/16 0735 01/31/16 1012 02/01/16 0435 02/02/16 0315 02/04/16 0410  NA 153* 149* 150* 144 142  K 7.0* 5.0 3.2* 4.0 4.6  CL 122* 124* 118* 113* 110  CO2 14* 15* 24 21* 24  GLUCOSE 47* 58* 80 90 77  BUN 24* 23* _0 CREATININE 1.23* 1.07* 1.07* 1.03* 0.91  CALCIUM 8.0* 7.6* 7.9* 7.9* 8.1*  MG  --   --  2.1  --   --    GFR: Estimated Creatinine Clearance: 56 mL/min (by C-G formula based on Cr of 0.91). Liver Function Tests:  Recent Labs Lab 01/30/16 1449 02/01/16 0435  AST 80* 62*  ALT 77* 53  ALKPHOS 166* 124  BILITOT 0.4 0.2*  PROT 6.6 5.3*  ALBUMIN 2.7* 2.0*   No results for input(s): LIPASE, AMYLASE in the last 168 hours. No results for input(s): AMMONIA in the last 168 hours. Coagulation Profile:  Recent Labs Lab 02/01/16 0435  INR 1.25   Cardiac Enzymes:  Recent Labs Lab 01/30/16 1449 01/30/16 1921 01/31/16 0133 01/31/16 0735  TROPONINI 0.04* <0.03 <0.03 0.03   BNP (last 3 results) No results for input(s): PROBNP in the last 8760 hours. HbA1C: No results for input(s): HGBA1C in the last 72 hours. CBG:  Recent Labs Lab 02/03/16 0459 02/03/16 1149 02/03/16 1727 02/04/16 0105 02/04/16 0534  GLUCAP 76 94 85 73 80   Lipid  Profile: No results for input(s): CHOL, HDL, LDLCALC, TRIG, CHOLHDL, LDLDIRECT in the last 72 hours. Thyroid Function Tests: No results for input(s): TSH, T4TOTAL, FREET4, T3FREE, THYROIDAB in the last 72 hours. Anemia Panel: No results for input(s): VITAMINB12, FOLATE, FERRITIN, TIBC, IRON, RETICCTPCT in the last 72 hours. Urine analysis:    Component Value Date/Time   COLORURINE YELLOW 01/30/2016 1359   APPEARANCEUR CLOUDY* 01/30/2016 1359   LABSPEC 1.023 01/30/2016 1359   PHURINE 5.0 01/30/2016 1359   GLUCOSEU NEGATIVE 01/30/2016 1359   HGBUR NEGATIVE 01/30/2016 1359   BILIRUBINUR SMALL* 01/30/2016 1359   KETONESUR 15* 01/30/2016 1359   PROTEINUR NEGATIVE 01/30/2016 1359   NITRITE NEGATIVE 01/30/2016 1359   LEUKOCYTESUR SMALL* 01/30/2016 1359    Recent Results (from the past 240 hour(s))  Urine culture     Status: Abnormal   Collection Time: 01/30/16  1:59 PM  Result Value Ref Range Status   Specimen Description URINE, CLEAN CATCH  Final   Special Requests Normal  Final   Culture  MULTIPLE SPECIES PRESENT, SUGGEST RECOLLECTION (A)  Final   Report Status 01/31/2016 FINAL  Final  Culture, blood (routine x 2)     Status: None   Collection Time: 01/30/16  2:15 PM  Result Value Ref Range Status   Specimen Description BLOOD RIGHT ANTECUBITAL  Final   Special Requests   Final    BOTTLES DRAWN AEROBIC AND ANAEROBIC 10CC AER 5CC ANA   Culture NO GROWTH 5 DAYS  Final   Report Status 02/04/2016 FINAL  Final  Culture, blood (routine x 2)     Status: Abnormal   Collection Time: 01/30/16  2:20 PM  Result Value Ref Range Status   Specimen Description BLOOD RIGHT HAND  Final   Special Requests BOTTLES DRAWN AEROBIC ONLY [place]  Final   Culture  Setup Time   Final    GRAM POSITIVE COCCI IN CLUSTERS AEROBIC BOTTLE ONLY Organism ID to follow CRITICAL RESULT CALLED TO, READ BACK BY AND VERIFIED WITH: J. Darleen Crocker D AT 0809 ON [id number] BY Rhea Bleacher    Culture STAPHYLOCOCCUS AUREUS (A)   Final   Report Status 02/02/2016 FINAL  Final   Organism ID, Bacteria STAPHYLOCOCCUS AUREUS  Final      Susceptibility   Staphylococcus aureus - MIC*    CIPROFLOXACIN <=0.5 SENSITIVE Sensitive     ERYTHROMYCIN 0.5 SENSITIVE Sensitive     GENTAMICIN <=0.5 SENSITIVE Sensitive     OXACILLIN 0.5 SENSITIVE Sensitive     TETRACYCLINE <=1 SENSITIVE Sensitive     VANCOMYCIN <=0.5 SENSITIVE Sensitive     TRIMETH/SULFA <=10 SENSITIVE Sensitive     CLINDAMYCIN <=0.25 SENSITIVE Sensitive     RIFAMPIN <=0.5 SENSITIVE Sensitive     Inducible Clindamycin NEGATIVE Sensitive     * STAPHYLOCOCCUS AUREUS  Blood Culture ID Panel (Reflexed)     Status: Abnormal   Collection Time: 01/30/16  2:20 PM  Result Value Ref Range Status   Enterococcus species NOT DETECTED NOT DETECTED Final   Vancomycin resistance NOT DETECTED NOT DETECTED Final   Listeria monocytogenes NOT DETECTED NOT DETECTED Final   Staphylococcus species NOT DETECTED NOT DETECTED Final   Staphylococcus aureus DETECTED (A) NOT DETECTED Final    Comment: CRITICAL RESULT CALLED TO, READ BACK BY AND VERIFIED WITH: J. Darleen Crocker D AT 0809 ON [id number] BY S. YARBROUGH    Methicillin resistance NOT DETECTED NOT DETECTED Final   Streptococcus species NOT DETECTED NOT DETECTED Final   Streptococcus agalactiae NOT DETECTED NOT DETECTED Final   Streptococcus pneumoniae NOT DETECTED NOT DETECTED Final   Streptococcus pyogenes NOT DETECTED NOT DETECTED Final   Acinetobacter baumannii NOT DETECTED NOT DETECTED Final   Enterobacteriaceae species NOT DETECTED NOT DETECTED Final   Enterobacter cloacae complex NOT DETECTED NOT DETECTED Final   Escherichia coli NOT DETECTED NOT DETECTED Final   Klebsiella oxytoca NOT DETECTED NOT DETECTED Final   Klebsiella pneumoniae NOT DETECTED NOT DETECTED Final   Proteus species NOT DETECTED NOT DETECTED Final   Serratia marcescens NOT DETECTED NOT DETECTED Final   Carbapenem resistance NOT DETECTED NOT DETECTED  Final   Haemophilus influenzae NOT DETECTED NOT DETECTED Final   Neisseria meningitidis NOT DETECTED NOT DETECTED Final   Pseudomonas aeruginosa NOT DETECTED NOT DETECTED Final   Candida albicans NOT DETECTED NOT DETECTED Final   Candida glabrata NOT DETECTED NOT DETECTED Final   Candida krusei NOT DETECTED NOT DETECTED Final   Candida parapsilosis NOT DETECTED NOT DETECTED Final   Candida tropicalis NOT DETECTED NOT DETECTED Final  MRSA PCR Screening     Status: None   Collection Time: 01/30/16  6:56 PM  Result Value Ref Range Status   MRSA by PCR NEGATIVE NEGATIVE Final    Comment:        The GeneXpert MRSA Assay (FDA approved for NASAL specimens only), is one component of a comprehensive MRSA colonization surveillance program. It is not intended to diagnose MRSA infection nor to guide or monitor treatment for MRSA infections.   Culture, blood (routine x 2)     Status: Abnormal (Preliminary result)   Collection Time: 01/31/16 10:35 AM  Result Value Ref Range Status   Specimen Description BLOOD RIGHT HAND  Final   Special Requests IN PEDIATRIC BOTTLE 2CC  Final   Culture  Setup Time   Final    GRAM POSITIVE COCCI IN CLUSTERS IN PEDIATRIC BOTTLE CRITICAL RESULT CALLED TO, READ BACK BY AND VERIFIED WITH: C. STEWART, PHARM D AT 0810 ON [id number] BY Rhea Bleacher    Culture (A)  Final    STAPHYLOCOCCUS SPECIES (COAGULASE NEGATIVE) THE SIGNIFICANCE OF ISOLATING THIS ORGANISM FROM A SINGLE SET OF BLOOD CULTURES WHEN MULTIPLE SETS ARE DRAWN IS UNCERTAIN. PLEASE NOTIFY THE MICROBIOLOGY DEPARTMENT WITHIN ONE WEEK IF SPECIATION AND SENSITIVITIES ARE REQUIRED.    Report Status PENDING  Incomplete  Culture, blood (routine x 2)     Status: None (Preliminary result)   Collection Time: 01/31/16 12:07 PM  Result Value Ref Range Status   Specimen Description BLOOD LEFT HAND  Final   Special Requests IN PEDIATRIC BOTTLE 1CC  Final   Culture NO GROWTH 4 DAYS  Final   Report Status PENDING   Incomplete      Anti-infectives    Start     Dose/Rate Route Frequency Ordered Stop   01/31/16 1500  cefTRIAXone (ROCEPHIN) 1 g in dextrose 5 % 50 mL IVPB  Status:  Discontinued     1 g 100 mL/hr over 30 Minutes Intravenous Every 24 hours 01/30/16 1853 01/31/16 0832   01/31/16 0845  ceFAZolin (ANCEF) IVPB 2g/100 mL premix     2 g 200 mL/hr over 30 Minutes Intravenous Every 8 hours 01/31/16 0832     01/30/16 1530  cefTRIAXone (ROCEPHIN) 1 g in dextrose 5 % 50 mL IVPB     1 g 100 mL/hr over 30 Minutes Intravenous  Once 01/30/16 1524 01/30/16 1634       Radiology Studies: No results found.      Scheduled Meds: . antiseptic oral rinse  7 mL Mouth Rinse BID  . aspirin  81 mg Oral Daily  .  ceFAZolin (ANCEF) IV  2 g Intravenous Q8H  . exemestane  25 mg Oral Daily  . feeding supplement (ENSURE ENLIVE)  237 mL Oral BID BM  . ferrous sulfate  325 mg Oral Q breakfast  . folic acid  1 mg Oral Daily  . heparin  5,000 Units Subcutaneous Q8H  . hydrOXYzine  25 mg Oral TID  . lactobacillus acidophilus  2 tablet Oral Daily  . loratadine  10 mg Oral Daily  . multivitamin with minerals  1 tablet Oral Daily  . pantoprazole  40 mg Oral Daily  . sodium chloride flush  10-40 mL Intracatheter Q12H   Continuous Infusions:     LOS: 5 days    Time spent: 35 min    [place], DO [facility] Pager [phone]  If 7PM-7AM, please contact night-coverage [email or web address] Password [email or web address] 02/04/2016, 11:59 AM

## 2016-02-05 LAB — CULTURE, BLOOD (ROUTINE X 2): Culture: NO GROWTH

## 2016-02-05 LAB — GLUCOSE, CAPILLARY
GLUCOSE-CAPILLARY: 73 mg/dL (ref 65–99)
GLUCOSE-CAPILLARY: 102 mg/dL — AB (ref 65–99)
Glucose-Capillary: 110 mg/dL — ABNORMAL HIGH (ref 65–99)
Glucose-Capillary: 58 mg/dL — ABNORMAL LOW (ref 65–99)
Glucose-Capillary: 63 mg/dL — ABNORMAL LOW (ref 65–99)
Glucose-Capillary: 84 mg/dL (ref 65–99)

## 2016-02-05 NOTE — Progress Notes (Signed)
PROGRESS NOTE    Sharon Velasquez  YEM:[id number] DOB: 1931-03-25 DOA: 01/30/2016 PCP: No primary care provider on file.   Outpatient Specialists:  Dr. Rhona Raider (oncologist)   Brief Narrative:  Patient was admitted on 01/30/2016, with complaint of speech difficulty, was found to have MSSA bacteremia. Currently further plan is continue further workup for acute encephalopathy as well as MSSA bacteremia.  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 Blood cultures are positive for MSSA-- repeat negative Antibiotic changed to cefazolin. Appreciate input from infectious disease- Dr. Baxter Flattery: will recommend to treat for 4 weeks as complicated bacteremia, defer getting TEE due to AMS, dementia, risk of aspiration--- weekly CMP, CBC with diff ESR CRP markedly elevated.  Acute encephalopathy.  CT scan unremarkable. Has significant hypernatremia likely evidence of dehydration.  MRI of the brain shows old stroke PT/OT and speech evaluation: Speech evaluation recommend dysphagia type II diet. 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 Consultation mentions that the  patient may have atypical Stevens-Johnson syndrome but later on discharge summary mentions this is less likely Stevens-Johnson syndrome and therefore patient was treated with high-dose steroids and improved. As per patient of this condition is worsening again after discharge home ? Reaction to aromasin-- patient will need to see oncology as outpatient-- discussed both with patient as well as daughter  Essential hypertension. Holding blood pressure medication in the setting of borderline blood pressure. Cortisol normal.  EF 45 % -outpatient work up after bacteremia has cleared -no old echos to compare with  Hypoglycemia -was not eating well - monitor off D5  History of breast cancer. Continue home regimen.  Hypothermia. Most likely the setting of infection/hypoglycemia. Bair hugger d/c'd  Hypokalemia -replete  Hypotension - AM cortisol ok, resolved   DVT prophylaxis:  SQ Heparin  Code Status: Full Code   Family Communication: Daughter 5/21  Disposition Plan:  From SNF (most recently) plan to return there 5/22?   Consultants:   ID  Procedures:        Subjective: Eating better  Objective: Filed Vitals:   02/04/16 1409 02/04/16 2115 02/05/16 0431 02/05/16 1350  BP: 113/69 102/58 127/61 101/65  Pulse: 85 93 95 90  Temp: 97.6 F (36.4 C) 97.5 F (36.4 C) 97.4 F (36.3 C) 97.5 F (36.4 C)  TempSrc: Oral Oral Oral Oral  Resp: '18 19 19 18  '$ Height:      Weight:      SpO2: 93% 95% 93% 96%    Intake/Output Summary (Last 24 hours) at 02/05/16 1458 Last data filed at 02/05/16 1352  Gross per 24 hour  Intake    880 ml  Output      0 ml  Net    880 ml   [employer]  01/30/16 1854  Weight: 110.7 kg (244 lb 0.8 oz)    Examination:  General exam: Appears calm and comfortable - NAD Respiratory system: Coarse breath sounds that cleared with cough. Cardiovascular system: S1 & S2 heard, RRR. No JVD, murmurs, rubs, gallops or clicks. No pedal  edema. Gastrointestinal system: Abdomen is nondistended, soft and nontender. No organomegaly or masses felt. Normal bowel sounds heard. Extremities: Symmetric 5 x 5 power. Skin: less dry but scaly in appearance     Data Reviewed: I have personally reviewed following labs and imaging studies  CBC:  Recent Labs Lab 01/30/16 1449 01/31/16 1012 02/01/16 0435 02/02/16 0315 02/04/16 0410  WBC 7.2 5.9 6.4 7.8 7.6  NEUTROABS 4.4 3.3 2.6  --   --   HGB 12.9 10.8* 10.7* 10.6* 11.3*  HCT 40.3 33.6* 34.7* 32.9* 36.2  MCV 85.2 86.8 86.1 84.4 83.2  PLT 148* 122* 135* 140* 638*   Basic Metabolic Panel:  Recent Labs Lab 01/31/16 0735 01/31/16 1012 02/01/16 0435 02/02/16 0315 02/04/16 0410  NA 153* 149* 150* 144 142  K 7.0* 5.0 3.2* 4.0 4.6  CL 122* 124* 118* 113* 110  CO2 14* 15* 24 21* 24  GLUCOSE 47* 58* 80 90 77  BUN 24* 23* '19 14 10  '$ CREATININE 1.23* 1.07* 1.07* 1.03* 0.91  CALCIUM 8.0* 7.6* 7.9* 7.9* 8.1*  MG  --   --  2.1  --   --    GFR: Estimated Creatinine Clearance: 56 mL/min (by C-G formula based on Cr of 0.91). Liver Function Tests:  Recent Labs Lab 01/30/16 1449 02/01/16 0435  AST 80* 62*  ALT 77* 53  ALKPHOS 166* 124  BILITOT 0.4 0.2*  PROT 6.6 5.3*  ALBUMIN 2.7* 2.0*   No results for input(s): LIPASE, AMYLASE in the last 168 hours. No results for input(s): AMMONIA in the last 168 hours. Coagulation Profile:  Recent Labs Lab 02/01/16 0435  INR 1.25   Cardiac Enzymes:  Recent Labs Lab 01/30/16 1449 01/30/16 1921 01/31/16 0133 01/31/16 0735  TROPONINI 0.04* <0.03 <0.03 0.03   BNP (last 3 results) No results for input(s): PROBNP in the last 8760 hours. HbA1C: No results for input(s): HGBA1C in the last 72 hours. CBG:  Recent Labs Lab 02/05/16 0012 02/05/16 0128 02/05/16 0605 02/05/16 0651 02/05/16 1202  GLUCAP 63* 73 58* 84 102*   Lipid Profile: No results for input(s): CHOL, HDL, LDLCALC, TRIG, CHOLHDL, LDLDIRECT in the last  72 hours. Thyroid Function Tests: No results for input(s): TSH, T4TOTAL, FREET4, T3FREE, THYROIDAB in the last 72 hours. Anemia Panel: No results for input(s): VITAMINB12, FOLATE, FERRITIN, TIBC, IRON, RETICCTPCT in the last 72 hours. Urine analysis:    Component Value Date/Time   COLORURINE YELLOW 01/30/2016 1359   APPEARANCEUR CLOUDY* 01/30/2016 1359   LABSPEC 1.023 01/30/2016 1359   PHURINE 5.0 01/30/2016 1359   GLUCOSEU NEGATIVE 01/30/2016 1359   HGBUR NEGATIVE 01/30/2016 1359   BILIRUBINUR SMALL* 01/30/2016 1359   KETONESUR 15* 01/30/2016 1359   PROTEINUR NEGATIVE 01/30/2016 1359   NITRITE NEGATIVE 01/30/2016 1359   LEUKOCYTESUR SMALL* 01/30/2016 1359    Recent Results (from the past 240 hour(s))  Urine culture     Status: Abnormal   Collection Time: 01/30/16  1:59 PM  Result Value Ref Range Status   Specimen Description URINE, CLEAN CATCH  Final   Special Requests Normal  Final   Culture MULTIPLE SPECIES PRESENT, SUGGEST RECOLLECTION (A)  Final   Report Status 01/31/2016 FINAL  Final  Culture, blood (routine x 2)     Status: None   Collection Time: 01/30/16  2:15 PM  Result Value Ref Range Status   Specimen Description BLOOD RIGHT ANTECUBITAL  Final   Special Requests   Final    BOTTLES DRAWN AEROBIC AND ANAEROBIC 10CC AER 5CC ANA   Culture NO GROWTH 5 DAYS  Final   Report Status 02/04/2016 FINAL  Final  Culture, blood (routine x 2)     Status: Abnormal   Collection Time: 01/30/16  2:20 PM  Result Value Ref Range Status   Specimen Description BLOOD RIGHT HAND  Final   Special Requests BOTTLES DRAWN AEROBIC ONLY Reserve  Final   Culture  Setup Time   Final    GRAM POSITIVE COCCI IN CLUSTERS AEROBIC BOTTLE ONLY Organism ID to follow CRITICAL RESULT CALLED TO, READ BACK BY AND VERIFIED WITH: J. Darleen Crocker D AT 0809 ON [id number] BY Rhea Bleacher    Culture STAPHYLOCOCCUS AUREUS (A)  Final   Report Status 02/02/2016 FINAL  Final   Organism ID, Bacteria STAPHYLOCOCCUS  AUREUS  Final      Susceptibility   Staphylococcus aureus - MIC*    CIPROFLOXACIN <=0.5 SENSITIVE Sensitive     ERYTHROMYCIN 0.5 SENSITIVE Sensitive     GENTAMICIN <=0.5 SENSITIVE Sensitive     OXACILLIN 0.5 SENSITIVE Sensitive     TETRACYCLINE <=1 SENSITIVE Sensitive     VANCOMYCIN <=0.5 SENSITIVE Sensitive     TRIMETH/SULFA <=10 SENSITIVE Sensitive     CLINDAMYCIN <=0.25 SENSITIVE Sensitive     RIFAMPIN <=0.5 SENSITIVE Sensitive     Inducible Clindamycin NEGATIVE Sensitive     * STAPHYLOCOCCUS AUREUS  Blood Culture ID Panel (Reflexed)     Status: Abnormal   Collection Time: 01/30/16  2:20 PM  Result Value Ref Range Status   Enterococcus species NOT DETECTED NOT DETECTED Final   Vancomycin resistance NOT DETECTED NOT DETECTED Final   Listeria monocytogenes NOT DETECTED NOT DETECTED Final   Staphylococcus species NOT DETECTED NOT DETECTED Final   Staphylococcus aureus DETECTED (A) NOT DETECTED Final    Comment: CRITICAL RESULT CALLED TO, READ BACK BY AND VERIFIED WITH: J. Darleen Crocker D AT 0809 ON [id number] BY S. YARBROUGH    Methicillin resistance NOT DETECTED NOT DETECTED Final   Streptococcus species NOT DETECTED NOT DETECTED Final   Streptococcus agalactiae NOT DETECTED NOT DETECTED Final   Streptococcus pneumoniae NOT DETECTED NOT DETECTED Final   Streptococcus pyogenes NOT DETECTED NOT DETECTED Final   Acinetobacter baumannii NOT DETECTED NOT DETECTED Final   Enterobacteriaceae species NOT DETECTED NOT DETECTED Final   Enterobacter cloacae complex NOT DETECTED NOT DETECTED Final   Escherichia coli NOT DETECTED NOT DETECTED Final   Klebsiella oxytoca NOT DETECTED NOT DETECTED Final   Klebsiella pneumoniae NOT DETECTED NOT DETECTED Final   Proteus species NOT DETECTED NOT DETECTED Final   Serratia marcescens NOT DETECTED NOT DETECTED Final   Carbapenem resistance NOT DETECTED NOT DETECTED Final   Haemophilus influenzae NOT DETECTED NOT DETECTED Final   Neisseria meningitidis  NOT DETECTED NOT DETECTED Final   Pseudomonas aeruginosa NOT DETECTED NOT DETECTED Final   Candida albicans NOT DETECTED NOT DETECTED Final   Candida glabrata NOT DETECTED NOT DETECTED Final   Candida krusei NOT DETECTED NOT DETECTED Final   Candida parapsilosis NOT DETECTED NOT DETECTED Final   Candida tropicalis NOT DETECTED NOT DETECTED Final  MRSA PCR Screening     Status: None   Collection Time: 01/30/16  6:56  PM  Result Value Ref Range Status   MRSA by PCR NEGATIVE NEGATIVE Final    Comment:        The GeneXpert MRSA Assay (FDA approved for NASAL specimens only), is one component of a comprehensive MRSA colonization surveillance program. It is not intended to diagnose MRSA infection nor to guide or monitor treatment for MRSA infections.   Culture, blood (routine x 2)     Status: Abnormal   Collection Time: 01/31/16 10:35 AM  Result Value Ref Range Status   Specimen Description BLOOD RIGHT HAND  Final   Special Requests IN PEDIATRIC BOTTLE 2CC  Final   Culture  Setup Time   Final    GRAM POSITIVE COCCI IN CLUSTERS IN PEDIATRIC BOTTLE CRITICAL RESULT CALLED TO, READ BACK BY AND VERIFIED WITH: C. STEWART, PHARM D AT 0810 ON [id number] BY Rhea Bleacher    Culture (A)  Final    STAPHYLOCOCCUS SPECIES (COAGULASE NEGATIVE) THE SIGNIFICANCE OF ISOLATING THIS ORGANISM FROM A SINGLE SET OF BLOOD CULTURES WHEN MULTIPLE SETS ARE DRAWN IS UNCERTAIN. PLEASE NOTIFY THE MICROBIOLOGY DEPARTMENT WITHIN ONE WEEK IF SPECIATION AND SENSITIVITIES ARE REQUIRED.    Report Status 02/05/2016 FINAL  Final  Culture, blood (routine x 2)     Status: None   Collection Time: 01/31/16 12:07 PM  Result Value Ref Range Status   Specimen Description BLOOD LEFT HAND  Final   Special Requests IN PEDIATRIC BOTTLE 1CC  Final   Culture NO GROWTH 5 DAYS  Final   Report Status 02/05/2016 FINAL  Final      Anti-infectives    Start     Dose/Rate Route Frequency Ordered Stop   01/31/16 1500  cefTRIAXone  (ROCEPHIN) 1 g in dextrose 5 % 50 mL IVPB  Status:  Discontinued     1 g 100 mL/hr over 30 Minutes Intravenous Every 24 hours 01/30/16 1853 01/31/16 0832   01/31/16 0845  ceFAZolin (ANCEF) IVPB 2g/100 mL premix     2 g 200 mL/hr over 30 Minutes Intravenous Every 8 hours 01/31/16 0832     01/30/16 1530  cefTRIAXone (ROCEPHIN) 1 g in dextrose 5 % 50 mL IVPB     1 g 100 mL/hr over 30 Minutes Intravenous  Once 01/30/16 1524 01/30/16 1634       Radiology Studies: No results found.      Scheduled Meds: . antiseptic oral rinse  7 mL Mouth Rinse BID  . aspirin  81 mg Oral Daily  .  ceFAZolin (ANCEF) IV  2 g Intravenous Q8H  . exemestane  25 mg Oral Daily  . feeding supplement (ENSURE ENLIVE)  237 mL Oral BID BM  . ferrous sulfate  325 mg Oral Q breakfast  . folic acid  1 mg Oral Daily  . heparin  5,000 Units Subcutaneous Q8H  . hydrOXYzine  25 mg Oral TID  . lactobacillus acidophilus  2 tablet Oral Daily  . loratadine  10 mg Oral Daily  . multivitamin with minerals  1 tablet Oral Daily  . pantoprazole  40 mg Oral Daily  . sodium chloride flush  10-40 mL Intracatheter Q12H   Continuous Infusions:     LOS: 6 days    Time spent: 25 min    [place], DO [facility] Pager [phone]  If 7PM-7AM, please contact night-coverage [email or web address] Password [facility] 02/05/2016, 2:58 PM

## 2016-02-06 LAB — GLUCOSE, CAPILLARY
GLUCOSE-CAPILLARY: 70 mg/dL (ref 65–99)
Glucose-Capillary: 73 mg/dL (ref 65–99)
Glucose-Capillary: 85 mg/dL (ref 65–99)

## 2016-02-06 MED ORDER — CEFAZOLIN SODIUM-DEXTROSE 2-4 GM/100ML-% IV SOLN: 2.0000 g | Freq: Three times a day (TID) | INTRAVENOUS | Status: DC

## 2016-02-06 MED ORDER — HEPARIN SOD (PORK) LOCK FLUSH 100 UNIT/ML IV SOLN
250.0000 [IU] | Freq: Every day | INTRAVENOUS | Status: DC
  Filled 2016-02-06: qty 2.5

## 2016-02-06 MED ORDER — RESOURCE THICKENUP CLEAR PO POWD: ORAL | Status: DC

## 2016-02-06 MED ORDER — ENSURE ENLIVE PO LIQD: 237.0000 mL | Freq: Two times a day (BID) | ORAL | Status: DC

## 2016-02-06 MED ORDER — CAMPHOR-MENTHOL 0.5-0.5 % EX LOTN: 1.0000 "application " | TOPICAL_LOTION | CUTANEOUS | Status: DC | PRN

## 2016-02-06 MED ORDER — HEPARIN SOD (PORK) LOCK FLUSH 100 UNIT/ML IV SOLN
250.0000 [IU] | INTRAVENOUS | Status: DC | PRN
  Administered 2016-02-06: 250 [IU]
  Filled 2016-02-06: qty 3

## 2016-02-06 NOTE — Discharge Summary (Signed)
Physician Discharge Summary  Sharon Velasquez TKW:[id number] DOB: 1930-11-15 DOA: 01/30/2016  PCP: No primary care provider on file.  Admit date: 01/30/2016 Discharge date: 02/06/2016   Recommendations for Outpatient Follow-Up:   Ancef until 6/13 DYS 2 nectar thick- aspiration precautions weekly CMP, CBC with diff while on ancef Will be discharged with PICC line Will need to see oncology dr Sharon Velasquez) in 1-2 weeks to discuss Aromasin and potential side effect of dermatitis- patient did not want to stop without seeing oncologist first -outpatient Derm follow up-- through [facility] Avoid constipation   Discharge Diagnosis:   Principal Problem:   MSSA (methicillin susceptible Staphylococcus aureus) infection Active Problems:   Hypertension   Malignant neoplasm of female breast ([place])   Erythematous condition   Morbid (severe) obesity due to excess calories (HCC)   GERD (gastroesophageal reflux disease)   Cancer (HCC)   UTI (lower urinary tract infection)   Hypernatremia   Acute encephalopathy   Malnutrition of moderate degree   Hypothermia   Discharge disposition:  SNF: [facility]  Discharge Condition: Improved.  Diet recommendation: DYS 2 nectar  Wound care: None.   History of Present Illness:   Sharon Velasquez is a 82 y.o. 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 In ED workup was significant for positive urinalysis,CT head with no acute findings, hypothermia, and hyponatremia, borderline  troponin at 0.04, she denies any chest pain or shortness of breath, EKG nonacute, with prolonged QTC. 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 Blood cultures are positive for MSSA-- repeat negative Antibiotic changed to cefazolin. Appreciate input from infectious disease- Dr. Baxter Velasquez: will recommend to treat for 4 weeks as complicated bacteremia, defer getting TEE due to AMS, dementia, risk of aspiration--- weekly CMP, CBC with diff ESR CRP markedly elevated.  Acute encephalopathy.  CT scan unremarkable. Has significant hypernatremia likely evidence of dehydration. MRI of the brain shows old stroke PT/OT and speech evaluation: Speech evaluation recommend dysphagia type II diet. 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 Consultation mentions that the patient may have atypical Stevens-Johnson syndrome but later on discharge summary mentions this is less likely Stevens-Johnson syndrome and therefore patient was treated with high-dose steroids and improved. As per patient of this condition is worsening again after discharge home ? Reaction to aromasin-- patient will need to see oncology as outpatient-- discussed both with patient as well as daughter  Essential hypertension. Holding blood pressure medication in the setting of borderline blood pressure. Cortisol normal.  EF 45 % -outpatient work up after bacteremia has cleared -no old echos to compare with  Hypoglycemia -was not eating well - monitor off D5  History of breast cancer. Continue home regimen.  Hypothermia. Most likely the setting of infection/hypoglycemia. Bair hugger  d/c'd  Hypokalemia -replete  Hypotension -  AM cortisol ok, resolved    Medical Consultants:    ID   Discharge Exam:   Filed Vitals:   02/05/16 2158 02/06/16 0645  BP: 109/61 106/79  Pulse: 88 95  Temp: 97.4 F (36.3 C) 97.7 F (36.5 C)  Resp: 16 16   Filed Vitals:   02/05/16 0431 02/05/16 1350 02/05/16 2158 02/06/16 0645  BP: 127/61 101/65 109/61 106/79  Pulse: 95 90 88 95  Temp: 97.4 F (36.3 C) 97.5 F (36.4 C) 97.4 F (36.3 C) 97.7 F (36.5 C)  TempSrc: Oral Oral Oral   Resp: _0 Height:      Weight:      SpO2: 93% 96% 94% 98%    Gen:  NAD- multiple complaints-- does not want to go to SNF- wants to go home   The results of significant diagnostics from this hospitalization (including imaging, microbiology, ancillary and laboratory) are listed below for reference.     Procedures and Diagnostic Studies:   Dg Chest 2 View  01/30/2016  CLINICAL DATA:  Slurred speech and hallucinations. EXAM: CHEST  2 VIEW COMPARISON:  12/27/2015 FINDINGS: The heart size and mediastinal contours are within normal limits. Stable bilateral pulmonary scarring. Lung volumes are low with bibasilar atelectasis present. There is no evidence of pulmonary edema, consolidation, pneumothorax, nodule or pleural fluid. The visualized skeletal structures are unremarkable. IMPRESSION: Low lung volumes with bibasilar atelectasis. Electronically Signed   By: Aletta Edouard M.D.   On: 01/30/2016 15:08   Ct Head Wo Contrast  01/30/2016  CLINICAL DATA:  Slurred speech and hallucinations since this morning. EXAM: CT HEAD WITHOUT CONTRAST TECHNIQUE: Contiguous axial images were obtained from the base of the skull through the vertex without intravenous contrast. COMPARISON:  None. FINDINGS: Brain: No evidence of acute infarction, hemorrhage, or mass effect. There is atrophy and chronic small vessel disease changes. There is prominent encephalomalacia of the left cerebellar hemisphere. Vascular:  Vascular calcifications at the skullbase. Skull: Negative for fracture or focal lesion. Sinuses/Orbits: No acute findings. Other: None. IMPRESSION: No evidence of acute infarction or intracranial hemorrhage. Atrophy, chronic microvascular disease. 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 Electronically Signed   By: Fidela Salisbury M.D.   On: 01/30/2016 15:19   Dg Chest Port 1 View  01/31/2016  CLINICAL DATA:  G93.40 (ICD-10-CM) - Acute encephalopathy R29.810 (ICD-10-CM) - Weakness on left side of face R09.89 (ICD-10-CM) - Bibasilar crackles EXAM: PORTABLE CHEST 1 VIEW COMPARISON:  01/30/2016 FINDINGS: Cardiac silhouette is mildly enlarged. Aorta is uncoiled. No mediastinal or hilar masses or convincing adenopathy. There persistent low lung volumes with elevation of right hemidiaphragm. No convincing pneumonia or pulmonary edema. Bony thorax is demineralized but grossly intact. IMPRESSION: 1. No acute findings. No significant change from the previous day's study. Electronically Signed   By: Lajean Manes M.D.   On: 01/31/2016 11:10     Labs:   Basic Metabolic Panel:  Recent Labs Lab 01/31/16 0735 01/31/16 1012 02/01/16 0435 02/02/16 0315 02/04/16 0410  NA 153* 149* 150* 144 142  K 7.0* 5.0 3.2* 4.0 4.6  CL 122* 124* 118* 113* 110  CO2 14* 15* 24 21* 24  GLUCOSE 47* 58* 80 90 77  BUN 24* 23* _1 CREATININE 1.23* 1.07* 1.07* 1.03* 0.91  CALCIUM 8.0* 7.6* 7.9* 7.9* 8.1*  MG  --   --  2.1  --   --    GFR Estimated Creatinine Clearance: 56 mL/min (  by C-G formula based on Cr of 0.91). Liver Function Tests:  Recent Labs Lab 01/30/16 1449 02/01/16 0435  AST 80* 62*  ALT 77* 53  ALKPHOS 166* 124  BILITOT 0.4 0.2*  PROT 6.6 5.3*  ALBUMIN 2.7* 2.0*   No results for input(s): LIPASE, AMYLASE in the last 168 hours. No results for input(s): AMMONIA in the last 168 hours. Coagulation profile  Recent Labs Lab 02/01/16 0435  INR  1.25    CBC:  Recent Labs Lab 01/30/16 1449 01/31/16 1012 02/01/16 0435 02/02/16 0315 02/04/16 0410  WBC 7.2 5.9 6.4 7.8 7.6  NEUTROABS 4.4 3.3 2.6  --   --   HGB 12.9 10.8* 10.7* 10.6* 11.3*  HCT 40.3 33.6* 34.7* 32.9* 36.2  MCV 85.2 86.8 86.1 84.4 83.2  PLT 148* 122* 135* 140* 139*   Cardiac Enzymes:  Recent Labs Lab 01/30/16 1449 01/30/16 1921 01/31/16 0133 01/31/16 0735  TROPONINI 0.04* <0.03 <0.03 0.03   BNP: Invalid input(s): POCBNP CBG:  Recent Labs Lab 02/05/16 0651 02/05/16 1202 02/05/16 1652 02/06/16 0017 02/06/16 0701  GLUCAP 84 102* 110* 73 70   D-Dimer No results for input(s): DDIMER in the last 72 hours. Hgb A1c No results for input(s): HGBA1C in the last 72 hours. Lipid Profile No results for input(s): CHOL, HDL, LDLCALC, TRIG, CHOLHDL, LDLDIRECT in the last 72 hours. Thyroid function studies No results for input(s): TSH, T4TOTAL, T3FREE, THYROIDAB in the last 72 hours.  Invalid input(s): FREET3 Anemia work up No results for input(s): VITAMINB12, FOLATE, FERRITIN, TIBC, IRON, RETICCTPCT in the last 72 hours. Microbiology Recent Results (from the past 240 hour(s))  Urine culture     Status: Abnormal   Collection Time: 01/30/16  1:59 PM  Result Value Ref Range Status   Specimen Description URINE, CLEAN CATCH  Final   Special Requests Normal  Final   Culture MULTIPLE SPECIES PRESENT, SUGGEST RECOLLECTION (A)  Final   Report Status 01/31/2016 FINAL  Final  Culture, blood (routine x 2)     Status: None   Collection Time: 01/30/16  2:15 PM  Result Value Ref Range Status   Specimen Description BLOOD RIGHT ANTECUBITAL  Final   Special Requests   Final    BOTTLES DRAWN AEROBIC AND ANAEROBIC 10CC AER 5CC ANA   Culture NO GROWTH 5 DAYS  Final   Report Status 02/04/2016 FINAL  Final  Culture, blood (routine x 2)     Status: Abnormal   Collection Time: 01/30/16  2:20 PM  Result Value Ref Range Status   Specimen Description BLOOD RIGHT HAND   Final   Special Requests BOTTLES DRAWN AEROBIC ONLY [place]  Final   Culture  Setup Time   Final    GRAM POSITIVE COCCI IN CLUSTERS AEROBIC BOTTLE ONLY Organism ID to follow CRITICAL RESULT CALLED TO, READ BACK BY AND VERIFIED WITH: J. Darleen Crocker D AT 0809 ON [id number] BY Rhea Bleacher    Culture STAPHYLOCOCCUS AUREUS (A)  Final   Report Status 02/02/2016 FINAL  Final   Organism ID, Bacteria STAPHYLOCOCCUS AUREUS  Final      Susceptibility   Staphylococcus aureus - MIC*    CIPROFLOXACIN <=0.5 SENSITIVE Sensitive     ERYTHROMYCIN 0.5 SENSITIVE Sensitive     GENTAMICIN <=0.5 SENSITIVE Sensitive     OXACILLIN 0.5 SENSITIVE Sensitive     TETRACYCLINE <=1 SENSITIVE Sensitive     VANCOMYCIN <=0.5 SENSITIVE Sensitive     TRIMETH/SULFA <=10 SENSITIVE Sensitive     CLINDAMYCIN <=0.25  SENSITIVE Sensitive     RIFAMPIN <=0.5 SENSITIVE Sensitive     Inducible Clindamycin NEGATIVE Sensitive     * STAPHYLOCOCCUS AUREUS  Blood Culture ID Panel (Reflexed)     Status: Abnormal   Collection Time: 01/30/16  2:20 PM  Result Value Ref Range Status   Enterococcus species NOT DETECTED NOT DETECTED Final   Vancomycin resistance NOT DETECTED NOT DETECTED Final   Listeria monocytogenes NOT DETECTED NOT DETECTED Final   Staphylococcus species NOT DETECTED NOT DETECTED Final   Staphylococcus aureus DETECTED (A) NOT DETECTED Final    Comment: CRITICAL RESULT CALLED TO, READ BACK BY AND VERIFIED WITH: J. Darleen Crocker D AT 0809 ON [id number] BY S. YARBROUGH    Methicillin resistance NOT DETECTED NOT DETECTED Final   Streptococcus species NOT DETECTED NOT DETECTED Final   Streptococcus agalactiae NOT DETECTED NOT DETECTED Final   Streptococcus pneumoniae NOT DETECTED NOT DETECTED Final   Streptococcus pyogenes NOT DETECTED NOT DETECTED Final   Acinetobacter baumannii NOT DETECTED NOT DETECTED Final   Enterobacteriaceae species NOT DETECTED NOT DETECTED Final   Enterobacter cloacae complex NOT DETECTED NOT DETECTED  Final   Escherichia coli NOT DETECTED NOT DETECTED Final   Klebsiella oxytoca NOT DETECTED NOT DETECTED Final   Klebsiella pneumoniae NOT DETECTED NOT DETECTED Final   Proteus species NOT DETECTED NOT DETECTED Final   Serratia marcescens NOT DETECTED NOT DETECTED Final   Carbapenem resistance NOT DETECTED NOT DETECTED Final   Haemophilus influenzae NOT DETECTED NOT DETECTED Final   Neisseria meningitidis NOT DETECTED NOT DETECTED Final   Pseudomonas aeruginosa NOT DETECTED NOT DETECTED Final   Candida albicans NOT DETECTED NOT DETECTED Final   Candida glabrata NOT DETECTED NOT DETECTED Final   Candida krusei NOT DETECTED NOT DETECTED Final   Candida parapsilosis NOT DETECTED NOT DETECTED Final   Candida tropicalis NOT DETECTED NOT DETECTED Final  MRSA PCR Screening     Status: None   Collection Time: 01/30/16  6:56 PM  Result Value Ref Range Status   MRSA by PCR NEGATIVE NEGATIVE Final    Comment:        The GeneXpert MRSA Assay (FDA approved for NASAL specimens only), is one component of a comprehensive MRSA colonization surveillance program. It is not intended to diagnose MRSA infection nor to guide or monitor treatment for MRSA infections.   Culture, blood (routine x 2)     Status: Abnormal   Collection Time: 01/31/16 10:35 AM  Result Value Ref Range Status   Specimen Description BLOOD RIGHT HAND  Final   Special Requests IN PEDIATRIC BOTTLE 2CC  Final   Culture  Setup Time   Final    GRAM POSITIVE COCCI IN CLUSTERS IN PEDIATRIC BOTTLE CRITICAL RESULT CALLED TO, READ BACK BY AND VERIFIED WITH: C. STEWART, PHARM D AT 0810 ON [id number] BY Rhea Bleacher    Culture (A)  Final    STAPHYLOCOCCUS SPECIES (COAGULASE NEGATIVE) THE SIGNIFICANCE OF ISOLATING THIS ORGANISM FROM A SINGLE SET OF BLOOD CULTURES WHEN MULTIPLE SETS ARE DRAWN IS UNCERTAIN. PLEASE NOTIFY THE MICROBIOLOGY DEPARTMENT WITHIN ONE WEEK IF SPECIATION AND SENSITIVITIES ARE REQUIRED.    Report Status 02/05/2016 FINAL   Final  Culture, blood (routine x 2)     Status: None   Collection Time: 01/31/16 12:07 PM  Result Value Ref Range Status   Specimen Description BLOOD LEFT HAND  Final   Special Requests IN PEDIATRIC BOTTLE [place]  Final   Culture NO GROWTH 5 DAYS  Final  Report Status 02/05/2016 FINAL  Final     Discharge Instructions:       Discharge Instructions    Discharge instructions    Complete by:  As directed   Ancef until 6/13 DYS 2 nectar thick weekly CMP, CBC with diff while on ancef Will be discharged with PICC line     Increase activity slowly    Complete by:  As directed             Medication List    STOP taking these medications        Garlic 676 MG Tabs     ibuprofen 200 MG tablet  Commonly known as:  ADVIL,MOTRIN     loperamide 2 MG capsule  Commonly known as:  IMODIUM     magnesium hydroxide 800 MG/5ML suspension  Commonly known as:  MILK OF MAGNESIA      TAKE these medications        aspirin 81 MG chewable tablet  Chew 81 mg by mouth daily.     bisacodyl 10 MG suppository  Commonly known as:  DULCOLAX  Place 10 mg rectally as needed for moderate constipation (if not relieved by Milk Of Magnesia).     Calcium Carbonate-Vitamin D 600-200 MG-UNIT Caps  Take 1 tablet by mouth once daily     camphor-menthol lotion  Commonly known as:  SARNA  Apply 1 application topically as needed for itching (dry skin).     ceFAZolin 2-4 GM/100ML-% IVPB  Commonly known as:  ANCEF  Inject 100 mLs (2 g total) into the vein every 8 (eight) hours.     exemestane 25 MG tablet  Commonly known as:  AROMASIN  Take 25 mg by mouth daily.     feeding supplement (ENSURE ENLIVE) Liqd  Take 237 mLs by mouth 2 (two) times daily between meals.     ferrous sulfate 325 (65 FE) MG tablet  Take 325 mg by mouth daily with breakfast.     Fish Oil 1000 MG Caps  Take 1,200 mg by mouth daily.     folic acid 1 MG tablet  Commonly known as:  FOLVITE  Take 1 mg by mouth daily.      hydrOXYzine 25 MG tablet  Commonly known as:  ATARAX/VISTARIL  Take 25 mg by mouth 3 (three) times daily.     lactobacillus acidophilus Tabs tablet  Take 2 tablets by mouth daily.     loratadine 10 MG tablet  Commonly known as:  CLARITIN  Take 10 mg by mouth daily.     multivitamin with minerals tablet  Take 1 tablet by mouth daily.     pantoprazole 40 MG tablet  Commonly known as:  PROTONIX  Take 40 mg by mouth daily.     RA SALINE ENEMA RE  Place 1 each rectally daily as needed (constipation not relieved by Milk of Magnesium or Bisacodyl Suppository).     RESOURCE THICKENUP CLEAR Powd  As needed for nectar consistency     Vitamin D (Cholecalciferol) 400 units Caps  Give 1 capsule by mouth daily          Time coordinating discharge: 35 min  Signed:  Michai Dieppa U Reilley Valentine   [facility] 02/06/2016, 11:30 AM

## 2016-02-06 NOTE — Progress Notes (Signed)
Offered the Pt. A bath and pt. Stated she wanted to wait until her food digested and then she would like to get wash up. Everything needed is all ready in the room

## 2016-02-06 NOTE — Progress Notes (Signed)
Pt discharge to [facility] via transport.  Pt has SL PICC to Rt UA.  Pt on RA.  Report called and given to RN.

## 2016-02-06 NOTE — Clinical Social Work Note (Signed)
Patient to be d/c'ed today to [facility]. Patient and family agreeable to plans will transport via ems RN to call report to [phone] Room 107.  CSW notified patient's daughter Shambria Lemasters at [phone] to inform her of discharge plan.  CSW to sign off please reconsult if other social work needs arise.  Evette Cristal, MSW, [place]

## 2016-02-07 ENCOUNTER — Encounter: Payer: Self-pay | Admitting: Internal Medicine

## 2016-02-07 ENCOUNTER — Non-Acute Institutional Stay (SKILLED_NURSING_FACILITY): Payer: Medicare Other | Admitting: Internal Medicine

## 2016-02-07 DIAGNOSIS — C801 Malignant (primary) neoplasm, unspecified: Secondary | ICD-10-CM

## 2016-02-07 DIAGNOSIS — A4901 Methicillin susceptible Staphylococcus aureus infection, unspecified site: Secondary | ICD-10-CM | POA: Diagnosis not present

## 2016-02-07 DIAGNOSIS — I10 Essential (primary) hypertension: Secondary | ICD-10-CM

## 2016-02-07 DIAGNOSIS — L26 Exfoliative dermatitis: Secondary | ICD-10-CM

## 2016-02-07 DIAGNOSIS — T68XXXD Hypothermia, subsequent encounter: Secondary | ICD-10-CM

## 2016-02-07 DIAGNOSIS — I950 Idiopathic hypotension: Secondary | ICD-10-CM | POA: Diagnosis not present

## 2016-02-07 DIAGNOSIS — K219 Gastro-esophageal reflux disease without esophagitis: Secondary | ICD-10-CM

## 2016-02-07 DIAGNOSIS — D696 Thrombocytopenia, unspecified: Secondary | ICD-10-CM

## 2016-02-07 DIAGNOSIS — E559 Vitamin D deficiency, unspecified: Secondary | ICD-10-CM | POA: Diagnosis not present

## 2016-02-07 DIAGNOSIS — G9340 Encephalopathy, unspecified: Secondary | ICD-10-CM

## 2016-02-07 DIAGNOSIS — D509 Iron deficiency anemia, unspecified: Secondary | ICD-10-CM

## 2016-02-07 NOTE — Care Management Important Message (Signed)
Important Message  Patient Details  Name: [name] MRN: [id number] Date of Birth: August 01, 1931   Medicare Important Message Given:  Yes    Loann Quill 02/07/2016, 1:44 PM

## 2016-02-07 NOTE — Progress Notes (Signed)
MRN: [id number] Name: Sharon Velasquez  Sex: female Age: 82 y.o. DOB: Aug 07, 1931  [place] #: [facility] Facility/Room:107 P Level Of Care: SNF Provider: Noah Delaine. Sheppard Coil, MD Emergency Contacts: Extended Emergency Contact Information Primary Emergency Contact: Malachi-Cerreta,Faith Address: [address] [place], [place] [address] [place] Phone: [phone] Mobile Phone: [phone] Relation: Daughter Secondary Emergency Contact: Malena Peer [place] Mobile Phone: [phone] Relation: Other  Code Status: Full Code  Allergies: Latex and Other  Chief Complaint  Patient presents with  . New Admit To SNF    Admission to Facility    HPI: Patient is 82 y.o. 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 In ED workup was significant for positive urinalysis,CT head with no acute findings, hypothermia, and hyponatremia, borderline troponin at 0.04, she denies any chest pain or shortness of breath, EKG nonacute, with prolonged QTC. Pt was admitted to [facility] from 5/15-22 where she was treated for MSSA sepsis, complicated by acute encephalopathy. Pt is admitted to SNF for generalized weakness. While at SNF pt will be followed for breast CA, tx with aromasin, GERD, tx with protonix and anemia, tx with iron.  Past Medical History  Diagnosis Date  . Hypertension   . GERD (gastroesophageal reflux disease)   . Cancer (HCC)     breast  . Seasonal allergies     History reviewed. No  pertinent past surgical history.    Medication List       This list is accurate as of: 02/07/16 11:59 PM.  Always use your most recent med list.               aspirin 81 MG chewable tablet  Chew 81 mg by mouth daily.     bisacodyl 10 MG suppository  Commonly known as:  DULCOLAX  Place 10 mg rectally as needed for moderate constipation (if not relieved by Milk Of Magnesia).     Calcium Carbonate-Vitamin D 600-200 MG-UNIT Caps  Take 1 tablet by mouth once daily     camphor-menthol lotion  Commonly known as:  SARNA  Apply 1 application topically as needed for itching (dry skin).     ceFAZolin 2-4 GM/100ML-% IVPB  Commonly known as:  ANCEF  Inject 100 mLs (2 g total) into the vein every 8 (eight) hours.     exemestane 25 MG tablet  Commonly known as:  AROMASIN  Take 25 mg by mouth daily.     feeding supplement (ENSURE ENLIVE) Liqd  Take 237 mLs by mouth 2 (two) times daily between meals.     ferrous sulfate 325 (65 FE) MG tablet  Take 325 mg by mouth daily with breakfast.     Fish Oil 1000 MG Caps  Take 1,200 mg by mouth daily.     folic acid 1 MG tablet  Commonly known as:  FOLVITE  Take 1 mg by mouth daily.     hydrOXYzine 25 MG tablet  Commonly known as:  ATARAX/VISTARIL  Take 25 mg by mouth 3 (three) times daily.  lactobacillus acidophilus Tabs tablet  Take 2 tablets by mouth daily.     loratadine 10 MG tablet  Commonly known as:  CLARITIN  Take 10 mg by mouth daily.     multivitamin with minerals tablet  Take 1 tablet by mouth daily.     pantoprazole 40 MG tablet  Commonly known as:  PROTONIX  Take 40 mg by mouth daily.     RA SALINE ENEMA RE  Place 1 each rectally daily as needed (constipation not relieved by Milk of Magnesium or Bisacodyl Suppository).     RESOURCE THICKENUP CLEAR Powd  As needed for nectar consistency     Vitamin D (Cholecalciferol) 400 units Caps  Give 1 capsule by mouth daily        No orders of the defined types  were placed in this encounter.    Immunization History  Administered Date(s) Administered  . PPD Test 01/13/2016    Social History  Substance Use Topics  . Smoking status: Never Smoker   . Smokeless tobacco: Not on file  . Alcohol Use: No    Family history is   Family History  Problem Relation Age of Onset  . Cancer Other       Review of Systems  DATA OBTAINED: from patient, nurse GENERAL:  no fevers, fatigue, appetite changes SKIN: No itching, +rash, no wounds EYES: No eye pain, redness, discharge EARS: No earache, tinnitus, change in hearing NOSE: No congestion, drainage or bleeding  MOUTH/THROAT: No mouth or tooth pain, No sore throat RESPIRATORY: No cough, wheezing, SOB CARDIAC: No chest pain, palpitations, lower extremity edema  GI: No abdominal pain, No N/V/D or constipation, No heartburn or reflux  GU: No dysuria, frequency or urgency, or incontinence  MUSCULOSKELETAL: No unrelieved bone/joint pain NEUROLOGIC: No headache, dizziness or focal weakness PSYCHIATRIC: No c/o anxiety or sadness   Filed Vitals:   02/07/16 0903  BP: 136/90  Pulse: 66  Temp: 98.8 F (37.1 C)  Resp: 21    SpO2 Readings from Last 1 Encounters:  02/06/16 94%        Physical Exam  GENERAL APPEARANCE: Alert, conversant, obese BF No acute distress.  SKIN: No diaphoresis, + rash HEAD: Normocephalic, atraumatic  EYES: Conjunctiva/lids clear. Pupils round, reactive. EOMs intact.  EARS: External exam WNL, canals clear. Hearing grossly normal.  NOSE: No deformity or discharge.  MOUTH/THROAT: Lips w/o lesions  RESPIRATORY: Breathing is even, unlabored. Lung sounds are clear   CARDIOVASCULAR: Heart RRR no murmurs, rubs or gallops. No peripheral edema.   GASTROINTESTINAL: Abdomen is soft, non-tender, not distended w/ normal bowel sounds. GENITOURINARY: Bladder non tender, not distended  MUSCULOSKELETAL: No abnormal joints or musculature NEUROLOGIC:  Cranial nerves 2-12 grossly  intact. Moves all extremities  PSYCHIATRIC: Mood and affect appropriate to situation, no behavioral issues  Patient Active Problem List   Diagnosis Date Noted  . 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 Hypotension 02/13/2016  . Iron deficiency anemia 02/13/2016  . Thrombocytopenia ([place]) 02/13/2016  . Hypothermia   . Malnutrition of moderate degree 02/02/2016  . MSSA (methicillin susceptible Staphylococcus aureus) infection 01/31/2016  . Acute encephalopathy 01/31/2016  . UTI (lower urinary tract infection) 01/30/2016  . Hypernatremia 01/30/2016  . Vitamin D deficiency 01/07/2016  . Hypertension   . GERD (gastroesophageal reflux disease)   . Cancer ([place])   . Seasonal allergies   . Stevens-Johnson syndrome ([place]) 12/29/2015  . Candida infection of mouth 12/28/2015  . Colonic constipation 12/28/2015  . Acidosis, hyperchloremic 12/26/2015  .  Elevated WBC count 12/26/2015  . Malignant neoplasm of female breast ([place]) 12/25/2015  . Erythematous condition 12/25/2015  . Morbid (severe) obesity due to excess calories ([place]) 12/25/2015  . Malignant neoplasm of kidney ([place]) 05/05/2014       Component Value Date/Time   WBC 7.6 02/04/2016 0410   RBC 4.35 02/04/2016 0410   HGB 11.3* 02/04/2016 0410   HCT 36.2 02/04/2016 0410   PLT 139* 02/04/2016 0410   MCV 83.2 02/04/2016 0410   LYMPHSABS 2.8 02/01/2016 0435   MONOABS 0.9 02/01/2016 0435   EOSABS 0.4 02/01/2016 0435   BASOSABS 0.0 02/01/2016 0435        Component Value Date/Time   NA 142 02/04/2016 0410   K 4.6 02/04/2016 0410   CL 110 02/04/2016 0410   CO2 24 02/04/2016 0410   GLUCOSE 77 02/04/2016 0410   BUN 10 02/04/2016 0410   CREATININE 0.91 02/04/2016 0410   CALCIUM 8.1* 02/04/2016 0410   PROT 5.3* 02/01/2016 0435   ALBUMIN 2.0* 02/01/2016 0435   AST 62* 02/01/2016 0435   ALT 53 02/01/2016 0435   ALKPHOS 124 02/01/2016 0435   BILITOT 0.2* 02/01/2016 0435   GFRNONAA 57* 02/04/2016 0410   GFRAA >60 02/04/2016 0410     No results found for: HGBA1C  No results found for: CHOL, HDL, LDLCALC, LDLDIRECT, TRIG, CHOLHDL   Dg Chest 2 View  01/30/2016  CLINICAL DATA:  Slurred speech and hallucinations. EXAM: CHEST  2 VIEW COMPARISON:  12/27/2015 FINDINGS: The heart size and mediastinal contours are within normal limits. Stable bilateral pulmonary scarring. Lung volumes are low with bibasilar atelectasis present. There is no evidence of pulmonary edema, consolidation, pneumothorax, nodule or pleural fluid. The visualized skeletal structures are unremarkable. IMPRESSION: Low lung volumes with bibasilar atelectasis. Electronically Signed   By: Aletta Edouard M.D.   On: 01/30/2016 15:08   Ct Head Wo Contrast  01/30/2016  CLINICAL DATA:  Slurred speech and hallucinations since this morning. EXAM: CT HEAD WITHOUT CONTRAST TECHNIQUE: Contiguous axial images were obtained from the base of the skull through the vertex without intravenous contrast. COMPARISON:  None. FINDINGS: Brain: No evidence of acute infarction, hemorrhage, or mass effect. There is atrophy and chronic small vessel disease changes. There is prominent encephalomalacia of the left cerebellar hemisphere. Vascular: Vascular calcifications at the skullbase. Skull: Negative for fracture or focal lesion. Sinuses/Orbits: No acute findings. Other: None. IMPRESSION: No evidence of acute infarction or intracranial hemorrhage. Atrophy, chronic microvascular disease. 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 Electronically Signed   By: Fidela Salisbury M.D.   On: 01/30/2016 15:19   Dg Chest Port 1 View  01/31/2016  CLINICAL DATA:  G93.40 (ICD-10-CM) - Acute encephalopathy R29.810 (ICD-10-CM) - Weakness on left side of face R09.89 (ICD-10-CM) - Bibasilar crackles EXAM: PORTABLE CHEST 1 VIEW COMPARISON:  01/30/2016 FINDINGS: Cardiac silhouette is mildly enlarged. Aorta is uncoiled. No mediastinal or hilar masses or convincing  adenopathy. There persistent low lung volumes with elevation of right hemidiaphragm. No convincing pneumonia or pulmonary edema. Bony thorax is demineralized but grossly intact. IMPRESSION: 1. No acute findings. No significant change from the previous day's study. Electronically Signed   By: Lajean Manes M.D.   On: 01/31/2016 11:10    Not all labs, radiology exams or other studies done during hospitalization come through on my EPIC note; however they are reviewed by me.    Assessment and Plan  Acute encephalopathy CT scan unremarkable. Has significant hypernatremia likely  evidence of dehydration. MRI of the brain shows old stroke PT/OT and speech evaluation: Speech evaluation recommend dysphagia type II diet. 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 Consultation mentions that the patient may have atypical Stevens-Johnson syndrome but later on discharge summary mentions this is less likely Stevens-Johnson syndrome and therefore patient was treated with high-dose steroids and improved. As per patient of this condition is worsening again after discharge home ? Reaction to aromasin-- patient will need to see oncology as outpatient-- discussed both with patient as well as daughter  Hypertension SNF - controlled on no meds 2/2 to lowish BP; will monitor and restart BP meds  Cancer (HCC) SNF- will cont aromasin 25 mg daily  Hypothermia Treated with bair hugger until resolved  Hypotension Resoved ;am cortisol nl  GERD (gastroesophageal reflux disease) SNF - not stated as uncontrolled;cont protonix 40 mg daily  Vitamin D deficiency SNF - cont home replacement 400 u daily  ANEMIA/THROMBOCYTOPENIA- d/c Hb11.3 and PLT 139; cont iron replacement;will f/u  CBC   Time spent . 45 min;> 50% of time with patient was spent reviewing records, labs, tests and studies, counseling and developing plan of care  Webb Silversmith D. Sheppard Coil, MD

## 2016-02-08 ENCOUNTER — Encounter: Payer: Self-pay | Admitting: Internal Medicine

## 2016-02-13 ENCOUNTER — Encounter: Payer: Self-pay | Admitting: Internal Medicine

## 2016-02-13 DIAGNOSIS — L26 Exfoliative dermatitis: Secondary | ICD-10-CM | POA: Insufficient documentation

## 2016-02-13 DIAGNOSIS — I959 Hypotension, unspecified: Secondary | ICD-10-CM | POA: Insufficient documentation

## 2016-02-13 DIAGNOSIS — D696 Thrombocytopenia, unspecified: Secondary | ICD-10-CM | POA: Insufficient documentation

## 2016-02-13 DIAGNOSIS — D509 Iron deficiency anemia, unspecified: Secondary | ICD-10-CM | POA: Insufficient documentation

## 2016-02-13 NOTE — Assessment & Plan Note (Signed)
SNF - controlled on no meds 2/2 to lowish BP; will monitor and restart BP meds

## 2016-02-13 NOTE — Assessment & Plan Note (Signed)
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 Blood cultures are positive for MSSA-- repeat negative Antibiotic changed to cefazolin. Appreciate input from infectious disease- Dr. Baxter Flattery: will recommend to treat for 4 weeks as complicated bacteremia, defer getting TEE due to AMS, dementia, risk of aspiration--- weekly CMP, CBC with diff ESR CRP markedly elevated. SNF - 4 weeks of cefazolin

## 2016-02-13 NOTE — Assessment & Plan Note (Signed)
The patient has significant skin disease Patient was seen by dermatology in [facility] regional for the same in April 2017. Consultation mentions that the patient may have atypical Stevens-Johnson syndrome but later on discharge summary mentions this is less likely Stevens-Johnson syndrome and therefore patient was treated with high-dose steroids and improved. As per patient of this condition is worsening again after discharge home ? Reaction to aromasin-- patient will need to see oncology as outpatient-- discussed both with patient as well as daughter

## 2016-02-13 NOTE — Assessment & Plan Note (Signed)
SNF - cont home replacement 400 u daily

## 2016-02-13 NOTE — Assessment & Plan Note (Signed)
Treated with bair hugger until resolved

## 2016-02-13 NOTE — Assessment & Plan Note (Signed)
Resoved ;am cortisol nl

## 2016-02-13 NOTE — Assessment & Plan Note (Signed)
SNF - not stated as uncontrolled;cont protonix 40 mg daily

## 2016-02-13 NOTE — Assessment & Plan Note (Signed)
CT scan unremarkable. Has significant hypernatremia likely evidence of dehydration. MRI of the brain shows old stroke PT/OT and speech evaluation: Speech evaluation recommend dysphagia type II diet. -patient is not ambulatory in 2 years per patient---- most recently at SNF (plan to return there) SNF - admitted for OT/PT

## 2016-02-13 NOTE — Assessment & Plan Note (Signed)
SNF- will cont aromasin 25 mg daily

## 2016-03-01 ENCOUNTER — Non-Acute Institutional Stay (SKILLED_NURSING_FACILITY): Payer: Medicare Other | Admitting: Internal Medicine

## 2016-03-01 DIAGNOSIS — D509 Iron deficiency anemia, unspecified: Secondary | ICD-10-CM

## 2016-03-01 DIAGNOSIS — I10 Essential (primary) hypertension: Secondary | ICD-10-CM

## 2016-03-01 DIAGNOSIS — K219 Gastro-esophageal reflux disease without esophagitis: Secondary | ICD-10-CM | POA: Diagnosis not present

## 2016-03-01 DIAGNOSIS — L511 Stevens-Johnson syndrome: Secondary | ICD-10-CM | POA: Diagnosis not present

## 2016-03-01 DIAGNOSIS — R531 Weakness: Secondary | ICD-10-CM

## 2016-03-01 NOTE — Progress Notes (Signed)
Patient ID: Sharon Velasquez, female   DOB: 03/19/1931, 82 y.o.   MRN: [id number]: [id number] Name: Sharon Velasquez  Sex: female Age: 82 y.o. DOB: 03/24/1931  [facility] #: [facility] Facility/Room:107 P Level Of Care: SNF Provider: Noah Delaine. Sheppard Coil, MD Emergency Contacts: Extended Emergency Contact Information Primary Emergency Contact: Malachi-Mantel,Faith Address: [address] [place], [place] [address] [place] Phone: [phone] Mobile Phone: [phone] Relation: Daughter Secondary Emergency Contact: Malena Peer [place] Mobile Phone: [phone] Relation: Other  Code Status: Full Code  Allergies: Latex and Other  Chief Complaint  Patient presents with  . Discharge Note    HPI: Patient is 82 y.o. 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    Marland Kitchen Pt was admitted to [facility] from 5/15-22 where she was treated for MSSA sepsis, complicated by acute encephalopathy. Pt iwasadmitted to SNF for generalized weakness. While at SNF pt followed for breast CA, tx with aromasin, GERD, tx with protonix and anemia, tx with iron  \Her stay here appears to be relatively uneventful-her mental status has improved significantly she will be going home and will have home health support which she needs also will need PT and OT for further strengthening she does have a history of weakness and morbid obesity also needs nursing support for multiple medical issues.  She will need a wheelchair for ambulation secondary to continued weakness.    .  Past Medical History  Diagnosis Date  . Hypertension   . GERD (gastroesophageal reflux  disease)   . Cancer ([place])     breast  . Seasonal allergies     No past surgical history on file.    Medication List       This list is accurate as of: 03/01/16 11:59 PM.  Always use your most recent med list.               aspirin 81 MG chewable tablet  Chew 81 mg by mouth daily.     bisacodyl 10 MG suppository  Commonly known as:  DULCOLAX  Place 10 mg rectally as needed for moderate constipation (if not relieved by Milk Of Magnesia).     Calcium Carbonate-Vitamin D 600-200 MG-UNIT Caps  Take 1 tablet by mouth once daily     camphor-menthol lotion  Commonly known as:  SARNA  Apply 1 application topically as needed for itching (dry skin).     exemestane 25 MG tablet  Commonly known as:  AROMASIN  Take 25 mg by mouth daily.     feeding supplement (ENSURE ENLIVE) Liqd  Take 237 mLs by mouth 2 (two) times daily between meals.     ferrous sulfate 325 (65 FE) MG tablet  Take 325 mg by mouth daily with breakfast.     Fish Oil 1000 MG Caps  Take 1,200 mg by mouth daily.     folic acid 1 MG tablet  Commonly known as:  FOLVITE  Take 1 mg by mouth daily.     hydrOXYzine 25 MG tablet  Commonly known as:  ATARAX/VISTARIL  Take 25 mg by mouth 3 (three) times daily.     lactobacillus acidophilus Tabs tablet  Take 2  tablets by mouth daily.     loratadine 10 MG tablet  Commonly known as:  CLARITIN  Take 10 mg by mouth daily.     multivitamin with minerals tablet  Take 1 tablet by mouth daily.     pantoprazole 40 MG tablet  Commonly known as:  PROTONIX  Take 40 mg by mouth daily.     RA SALINE ENEMA RE  Place 1 each rectally daily as needed (constipation not relieved by Milk of Magnesium or Bisacodyl Suppository).     RESOURCE THICKENUP CLEAR Powd  As needed for nectar consistency     Vitamin D (Cholecalciferol) 400 units Caps  Give 1 capsule by mouth daily        No orders of the defined types were placed in this encounter.    Immunization History   Administered Date(s) Administered  . PPD Test 01/13/2016    Social History  Substance Use Topics  . Smoking status: Never Smoker   . Smokeless tobacco: Not on file  . Alcohol Use: No    Family history is   Family History  Problem Relation Age of Onset  . Cancer Other       Review of Systems  DATA OBTAINED: from patient, nurse GENERAL:  no fevers, fatigue, appetite changes SKIN: No itching, rash has resolved, no wounds EYES: No eye pain, redness, discharge EARS: No earache, tinnitus, change in hearing NOSE: No congestion, drainage or bleeding  MOUTH/THROAT: No mouth or tooth pain, No sore throat RESPIRATORY: No cough, wheezing, SOB CARDIAC: No chest pain, palpitations, mild lower extremity edema  in her feet  GI: No abdominal pain, No N/V/D or constipation, No heartburn or reflux  GU: No dysuria, frequency or urgency, or incontinence  MUSCULOSKELETAL: No unrelieved bone/joint pain NEUROLOGIC: No headache, dizziness or focal weakness PSYCHIATRIC: No c/o anxiety or sadness   Filed Vitals:   03/04/16 1119  BP: 108/68  Pulse: 90  Temp: 97.8 F (36.6 C)  Resp: 20    SpO2 Readings from Last 1 Encounters:  02/06/16 94%        Physical Exam  GENERAL APPEARANCE: Alert, conversant, obese BF No acute distress.  SKIN: No diaphoresis, She appears to have resolved HEAD: Normocephalic, atraumatic  EYES: Conjunctiva/lids clear. Pupils round, reactive. EOMs intact.  EARS: External exam WNL, canals clear. Hearing grossly normal.  NOSE: No deformity or discharge.  MOUTH/THROAT: Lips w/o lesions  RESPIRATORY: Breathing is even, unlabored. Lung sounds are clear   CARDIOVASCULAR: Heart RRR no murmurs, rubs or gallops has pedal edema   GASTROINTESTINAL: Abdomen is soft,obese non-tender, not distended w/ normal bowel sounds. GENITOURINARY: Bladder non tender, not distended  MUSCULOSKELETAL: No abnormal joints or musculature has baseline lower extremity weakness NEUROLOGIC:   Cranial nerves 2-12 grossly intact. Moves all extremities at baseline lower extremity weakness PSYCHIATRIC: Mood and affect appropriate to situation, no behavioral issues pleasant and appropriate  Patient Active Problem List   Diagnosis Date Noted  . 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 Hypotension 02/13/2016  . Iron deficiency anemia 02/13/2016  . Thrombocytopenia ([place]) 02/13/2016  . Hypothermia   . Malnutrition of moderate degree 02/02/2016  . MSSA (methicillin susceptible Staphylococcus aureus) infection 01/31/2016  . Acute encephalopathy 01/31/2016  . UTI (lower urinary tract infection) 01/30/2016  . Hypernatremia 01/30/2016  . Vitamin D deficiency 01/07/2016  . Hypertension   . GERD (gastroesophageal reflux disease)   . Cancer ([place])   . Seasonal allergies   . Stevens-Johnson syndrome ([place]) 12/29/2015  . Candida infection  of mouth 12/28/2015  . Colonic constipation 12/28/2015  . Acidosis, hyperchloremic 12/26/2015  . Elevated WBC count 12/26/2015  . Malignant neoplasm of female breast ([place]) 12/25/2015  . Erythematous condition 12/25/2015  . Morbid (severe) obesity due to excess calories ([place]) 12/25/2015  . Malignant neoplasm of kidney ([place]) 05/05/2014       Component Value Date/Time   WBC 7.6 02/04/2016 0410   RBC 4.35 02/04/2016 0410   HGB 11.3* 02/04/2016 0410   HCT 36.2 02/04/2016 0410   PLT 139* 02/04/2016 0410   MCV 83.2 02/04/2016 0410   LYMPHSABS 2.8 02/01/2016 0435   MONOABS 0.9 02/01/2016 0435   EOSABS 0.4 02/01/2016 0435   BASOSABS 0.0 02/01/2016 0435        Component Value Date/Time   NA 142 02/04/2016 0410   K 4.6 02/04/2016 0410   CL 110 02/04/2016 0410   CO2 24 02/04/2016 0410   GLUCOSE 77 02/04/2016 0410   BUN 10 02/04/2016 0410   CREATININE 0.91 02/04/2016 0410   CALCIUM 8.1* 02/04/2016 0410   PROT 5.3* 02/01/2016 0435   ALBUMIN 2.0* 02/01/2016 0435   AST 62* 02/01/2016 0435   ALT 53 02/01/2016 0435   ALKPHOS 124 02/01/2016 0435    BILITOT 0.2* 02/01/2016 0435   GFRNONAA 57* 02/04/2016 0410   GFRAA >60 02/04/2016 0410    No results found for: HGBA1C  No results found for: CHOL, HDL, LDLCALC, LDLDIRECT, TRIG, CHOLHDL   Dg Chest 2 View  01/30/2016  CLINICAL DATA:  Slurred speech and hallucinations. EXAM: CHEST  2 VIEW COMPARISON:  12/27/2015 FINDINGS: The heart size and mediastinal contours are within normal limits. Stable bilateral pulmonary scarring. Lung volumes are low with bibasilar atelectasis present. There is no evidence of pulmonary edema, consolidation, pneumothorax, nodule or pleural fluid. The visualized skeletal structures are unremarkable. IMPRESSION: Low lung volumes with bibasilar atelectasis. Electronically Signed   By: Aletta Edouard M.D.   On: 01/30/2016 15:08   Ct Head Wo Contrast  01/30/2016  CLINICAL DATA:  Slurred speech and hallucinations since this morning. EXAM: CT HEAD WITHOUT CONTRAST TECHNIQUE: Contiguous axial images were obtained from the base of the skull through the vertex without intravenous contrast. COMPARISON:  None. FINDINGS: Brain: No evidence of acute infarction, hemorrhage, or mass effect. There is atrophy and chronic small vessel disease changes. There is prominent encephalomalacia of the left cerebellar hemisphere. Vascular: Vascular calcifications at the skullbase. Skull: Negative for fracture or focal lesion. Sinuses/Orbits: No acute findings. Other: None. IMPRESSION: No evidence of acute infarction or intracranial hemorrhage. Atrophy, chronic microvascular disease. 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 Electronically Signed   By: Fidela Salisbury M.D.   On: 01/30/2016 15:19   Dg Chest Port 1 View  01/31/2016  CLINICAL DATA:  G93.40 (ICD-10-CM) - Acute encephalopathy R29.810 (ICD-10-CM) - Weakness on left side of face R09.89 (ICD-10-CM) - Bibasilar crackles EXAM: PORTABLE CHEST 1 VIEW COMPARISON:  01/30/2016 FINDINGS: Cardiac silhouette  is mildly enlarged. Aorta is uncoiled. No mediastinal or hilar masses or convincing adenopathy. There persistent low lung volumes with elevation of right hemidiaphragm. No convincing pneumonia or pulmonary edema. Bony thorax is demineralized but grossly intact. IMPRESSION: 1. No acute findings. No significant change from the previous day's study. Electronically Signed   By: Lajean Manes M.D.   On: 01/31/2016 11:10    Not all labs, radiology exams or other studies done during hospitalization come through on my EPIC note; however they are reviewed by me.  Assessment and Plan  Stevens-Johnson syndrome-apparently at one point this covered up to 80% of her body thought to be from a viral illness or recent antibiotic use this appears to have resolved she did receive prednisone and triamcinolone.  #2 history malignant neoplasm of breast she is treated aromasin  #3 hypertension-this appears to be controlled.  #4 history of GERD she continues on Prilosec this appears to be stable.  #5 history of morbid obesity-again she will need continued PT and OT for strengthening as well as nursing support.  History of vitamin D deficiency this is being supplemented.  #7 anemia thought to have an element of iron deficiency she is on iron Will update a CBC with primary care provider notified of results most recent hemoglobin 11.3 on 02/04/2016  Again patient appears to have gained strength with therapy although with history of morbid obesity she still has significant weakness she will need again continued PT and OT and nursing support as well as a wheelchair for ambulation.  In regards to anemia we will update a CBC as well as a metabolic panel with primary care provider notified of results.  [id number] note greater than 30 minutes spent preparing this discharge summary-greater than 50% of time spent coordinating plan of care for numerous diagnoses

## 2016-03-03 DIAGNOSIS — L130 Dermatitis herpetiformis: Secondary | ICD-10-CM

## 2016-03-03 DIAGNOSIS — L26 Exfoliative dermatitis: Secondary | ICD-10-CM | POA: Diagnosis not present

## 2016-03-03 DIAGNOSIS — L511 Stevens-Johnson syndrome: Secondary | ICD-10-CM | POA: Diagnosis not present

## 2016-03-03 DIAGNOSIS — C50912 Malignant neoplasm of unspecified site of left female breast: Secondary | ICD-10-CM | POA: Diagnosis not present

## 2016-03-03 DIAGNOSIS — I10 Essential (primary) hypertension: Secondary | ICD-10-CM

## 2016-03-03 DIAGNOSIS — C50911 Malignant neoplasm of unspecified site of right female breast: Secondary | ICD-10-CM

## 2016-03-04 ENCOUNTER — Encounter: Payer: Self-pay | Admitting: Internal Medicine

## 2016-03-07 ENCOUNTER — Encounter (HOSPITAL_COMMUNITY): Payer: Self-pay | Admitting: Emergency Medicine

## 2016-03-07 ENCOUNTER — Other Ambulatory Visit: Payer: Self-pay

## 2016-03-07 ENCOUNTER — Inpatient Hospital Stay (HOSPITAL_COMMUNITY)
Admission: EM | Admit: 2016-03-07 | Discharge: 2016-03-13 | DRG: 061 | Disposition: A | Discharge status: Skilled Nursing Facility | Payer: Medicare Other | Attending: Neurology | Admitting: Neurology

## 2016-03-07 ENCOUNTER — Emergency Department (HOSPITAL_COMMUNITY): Payer: Medicare Other

## 2016-03-07 ENCOUNTER — Inpatient Hospital Stay (HOSPITAL_COMMUNITY): Payer: Medicare Other

## 2016-03-07 DIAGNOSIS — E785 Hyperlipidemia, unspecified: Secondary | ICD-10-CM | POA: Diagnosis present

## 2016-03-07 DIAGNOSIS — Z853 Personal history of malignant neoplasm of breast: Secondary | ICD-10-CM

## 2016-03-07 DIAGNOSIS — D649 Anemia, unspecified: Secondary | ICD-10-CM | POA: Diagnosis present

## 2016-03-07 DIAGNOSIS — I82512 Chronic embolism and thrombosis of left femoral vein: Secondary | ICD-10-CM | POA: Diagnosis present

## 2016-03-07 DIAGNOSIS — I63031 Cerebral infarction due to thrombosis of right carotid artery: Secondary | ICD-10-CM | POA: Diagnosis not present

## 2016-03-07 DIAGNOSIS — I63511 Cerebral infarction due to unspecified occlusion or stenosis of right middle cerebral artery: Secondary | ICD-10-CM | POA: Diagnosis present

## 2016-03-07 DIAGNOSIS — Z79899 Other long term (current) drug therapy: Secondary | ICD-10-CM | POA: Diagnosis not present

## 2016-03-07 DIAGNOSIS — I639 Cerebral infarction, unspecified: Secondary | ICD-10-CM

## 2016-03-07 DIAGNOSIS — Z7401 Bed confinement status: Secondary | ICD-10-CM | POA: Diagnosis not present

## 2016-03-07 DIAGNOSIS — G9389 Other specified disorders of brain: Secondary | ICD-10-CM | POA: Diagnosis present

## 2016-03-07 DIAGNOSIS — I6350 Cerebral infarction due to unspecified occlusion or stenosis of unspecified cerebral artery: Secondary | ICD-10-CM

## 2016-03-07 DIAGNOSIS — K219 Gastro-esophageal reflux disease without esophagitis: Secondary | ICD-10-CM | POA: Diagnosis present

## 2016-03-07 DIAGNOSIS — R471 Dysarthria and anarthria: Secondary | ICD-10-CM | POA: Diagnosis present

## 2016-03-07 DIAGNOSIS — L26 Exfoliative dermatitis: Secondary | ICD-10-CM | POA: Diagnosis present

## 2016-03-07 DIAGNOSIS — I82403 Acute embolism and thrombosis of unspecified deep veins of lower extremity, bilateral: Secondary | ICD-10-CM | POA: Diagnosis not present

## 2016-03-07 DIAGNOSIS — Z6839 Body mass index (BMI) 39.0-39.9, adult: Secondary | ICD-10-CM

## 2016-03-07 DIAGNOSIS — R531 Weakness: Secondary | ICD-10-CM | POA: Diagnosis present

## 2016-03-07 DIAGNOSIS — I10 Essential (primary) hypertension: Secondary | ICD-10-CM | POA: Diagnosis present

## 2016-03-07 DIAGNOSIS — I63411 Cerebral infarction due to embolism of right middle cerebral artery: Principal | ICD-10-CM | POA: Diagnosis present

## 2016-03-07 DIAGNOSIS — E669 Obesity, unspecified: Secondary | ICD-10-CM | POA: Diagnosis present

## 2016-03-07 DIAGNOSIS — R29810 Facial weakness: Secondary | ICD-10-CM | POA: Diagnosis present

## 2016-03-07 DIAGNOSIS — I82411 Acute embolism and thrombosis of right femoral vein: Secondary | ICD-10-CM | POA: Diagnosis present

## 2016-03-07 DIAGNOSIS — I2699 Other pulmonary embolism without acute cor pulmonale: Secondary | ICD-10-CM

## 2016-03-07 DIAGNOSIS — R29715 NIHSS score 15: Secondary | ICD-10-CM | POA: Diagnosis present

## 2016-03-07 DIAGNOSIS — Z7982 Long term (current) use of aspirin: Secondary | ICD-10-CM

## 2016-03-07 DIAGNOSIS — I63311 Cerebral infarction due to thrombosis of right middle cerebral artery: Secondary | ICD-10-CM | POA: Diagnosis not present

## 2016-03-07 DIAGNOSIS — I6789 Other cerebrovascular disease: Secondary | ICD-10-CM | POA: Diagnosis not present

## 2016-03-07 LAB — I-STAT CHEM 8, ED
BUN: 19 mg/dL (ref 6–20)
CALCIUM ION: 1.13 mmol/L (ref 1.13–1.30)
Chloride: 109 mmol/L (ref 101–111)
Creatinine, Ser: 0.7 mg/dL (ref 0.44–1.00)
GLUCOSE: 131 mg/dL — AB (ref 65–99)
HCT: 43 % (ref 36.0–46.0)
Hemoglobin: 14.6 g/dL (ref 12.0–15.0)
Potassium: 4.2 mmol/L (ref 3.5–5.1)
SODIUM: 144 mmol/L (ref 135–145)
TCO2: 24 mmol/L (ref 0–100)

## 2016-03-07 LAB — BASIC METABOLIC PANEL
BUN: 19 mg/dL (ref 4–21)
CREATININE: 0.7 mg/dL (ref 0.5–1.1)
GLUCOSE: 131 mg/dL
Sodium: 144 mmol/L (ref 137–147)

## 2016-03-07 LAB — COMPREHENSIVE METABOLIC PANEL
ALK PHOS: 98 U/L (ref 38–126)
ALT: 16 U/L (ref 14–54)
ANION GAP: 7 (ref 5–15)
AST: 34 U/L (ref 15–41)
Albumin: 2.6 g/dL — ABNORMAL LOW (ref 3.5–5.0)
BILIRUBIN TOTAL: 0.2 mg/dL — AB (ref 0.3–1.2)
BUN: 16 mg/dL (ref 6–20)
CALCIUM: 9.2 mg/dL (ref 8.9–10.3)
CO2: 24 mmol/L (ref 22–32)
CREATININE: 0.78 mg/dL (ref 0.44–1.00)
Chloride: 110 mmol/L (ref 101–111)
GFR calc non Af Amer: >60 mL/min (ref >60)
Glucose, Bld: 134 mg/dL — ABNORMAL HIGH (ref 65–99)
Potassium: 4.2 mmol/L (ref 3.5–5.1)
Sodium: 141 mmol/L (ref 135–145)
TOTAL PROTEIN: 7.1 g/dL (ref 6.5–8.1)

## 2016-03-07 LAB — CBC
HEMATOCRIT: 39.4 % (ref 36.0–46.0)
HEMOGLOBIN: 12.3 g/dL (ref 12.0–15.0)
MCH: 27.2 pg (ref 26.0–34.0)
MCHC: 31.2 g/dL (ref 30.0–36.0)
MCV: 87.2 fL (ref 78.0–100.0)
PLATELETS: 180 10*3/uL (ref 150–400)
RBC: 4.52 MIL/uL (ref 3.87–5.11)
RDW: 18.6 % — ABNORMAL HIGH (ref 11.5–15.5)
WBC: 10.2 10*3/uL (ref 4.0–10.5)

## 2016-03-07 LAB — DIFFERENTIAL
Basophils Absolute: 0.1 10*3/uL (ref 0.0–0.1)
Basophils Relative: 1 %
EOS PCT: 5 %
Eosinophils Absolute: 0.5 10*3/uL (ref 0.0–0.7)
LYMPHS ABS: 4.8 10*3/uL — AB (ref 0.7–4.0)
LYMPHS PCT: 47 %
MONO ABS: 1.2 10*3/uL — AB (ref 0.1–1.0)
MONOS PCT: 12 %
NEUTROS ABS: 3.6 10*3/uL (ref 1.7–7.7)
Neutrophils Relative %: 36 %

## 2016-03-07 LAB — HEPATIC FUNCTION PANEL
ALT: 16 U/L (ref 7–35)
AST: 34 U/L (ref 13–35)
Alkaline Phosphatase: 98 U/L (ref 25–125)
Bilirubin, Total: 0.2 mg/dL

## 2016-03-07 LAB — ETHANOL: Alcohol, Ethyl (B): <5 mg/dL (ref <5)

## 2016-03-07 LAB — PROTIME-INR
INR: 1.08 (ref 0.00–1.49)
PROTHROMBIN TIME: 14.2 s (ref 11.6–15.2)

## 2016-03-07 LAB — I-STAT TROPONIN, ED: Troponin i, poc: 0.01 ng/mL (ref 0.00–0.08)

## 2016-03-07 LAB — APTT: aPTT: 25 seconds (ref 24–37)

## 2016-03-07 MED ORDER — SODIUM CHLORIDE 0.9 % IV SOLN
INTRAVENOUS | Status: DC
  Administered 2016-03-08 – 2016-03-12 (×3): via INTRAVENOUS

## 2016-03-07 MED ORDER — STROKE: EARLY STAGES OF RECOVERY BOOK
Freq: Once | Status: DC
  Filled 2016-03-07: qty 1

## 2016-03-07 MED ORDER — ALTEPLASE (STROKE) FULL DOSE INFUSION
0.9000 mg/kg | Freq: Once | INTRAVENOUS | Status: AC
  Administered 2016-03-07: 86 mg via INTRAVENOUS
  Filled 2016-03-07: qty 100

## 2016-03-07 MED ORDER — ACETAMINOPHEN 325 MG PO TABS: 650.0000 mg | ORAL_TABLET | ORAL | Status: DC | PRN

## 2016-03-07 MED ORDER — SODIUM CHLORIDE 0.9 % IV SOLN
50.0000 mL | Freq: Once | INTRAVENOUS | Status: AC
  Administered 2016-03-08: 50 mL via INTRAVENOUS

## 2016-03-07 MED ORDER — IOPAMIDOL (ISOVUE-370) INJECTION 76%
INTRAVENOUS | Status: AC
  Administered 2016-03-08: 50 mL
  Filled 2016-03-07: qty 50

## 2016-03-07 MED ORDER — ACETAMINOPHEN 650 MG RE SUPP: 650.0000 mg | RECTAL | Status: DC | PRN

## 2016-03-07 NOTE — ED Notes (Signed)
Attempted to place foley x2, unable to d/t contractures.

## 2016-03-07 NOTE — ED Notes (Signed)
Pt arrives via EMS as a code stroke, LKW 2123 - found to be flaccid on L sice with R sided gaze. Per EMS report no hx of stroke. Pt is bedbound, difficult IV access so 22G placed. CBG 113 Alert x4

## 2016-03-08 ENCOUNTER — Inpatient Hospital Stay (HOSPITAL_COMMUNITY): Payer: Medicare Other

## 2016-03-08 ENCOUNTER — Encounter (HOSPITAL_COMMUNITY): Payer: Self-pay | Admitting: Neurology

## 2016-03-08 DIAGNOSIS — I10 Essential (primary) hypertension: Secondary | ICD-10-CM

## 2016-03-08 DIAGNOSIS — I63411 Cerebral infarction due to embolism of right middle cerebral artery: Principal | ICD-10-CM

## 2016-03-08 DIAGNOSIS — I63311 Cerebral infarction due to thrombosis of right middle cerebral artery: Secondary | ICD-10-CM

## 2016-03-08 DIAGNOSIS — I639 Cerebral infarction, unspecified: Secondary | ICD-10-CM

## 2016-03-08 DIAGNOSIS — I82403 Acute embolism and thrombosis of unspecified deep veins of lower extremity, bilateral: Secondary | ICD-10-CM

## 2016-03-08 DIAGNOSIS — I2699 Other pulmonary embolism without acute cor pulmonale: Secondary | ICD-10-CM

## 2016-03-08 LAB — LIPID PANEL
Cholesterol: 243 mg/dL — ABNORMAL HIGH (ref 0–200)
HDL: 59 mg/dL (ref >40)
LDL Cholesterol: 164 mg/dL — ABNORMAL HIGH (ref 0–99)
Total CHOL/HDL Ratio: 4.1 RATIO
Triglycerides: 101 mg/dL (ref <150)
VLDL: 20 mg/dL (ref 0–40)

## 2016-03-08 LAB — MRSA PCR SCREENING: MRSA BY PCR: NEGATIVE

## 2016-03-08 MED ORDER — LABETALOL HCL 5 MG/ML IV SOLN: 10.0000 mg | INTRAVENOUS | Status: DC | PRN

## 2016-03-08 MED ORDER — HEPARIN (PORCINE) IN NACL 100-0.45 UNIT/ML-% IJ SOLN
850.0000 [IU]/h | INTRAMUSCULAR | Status: DC
  Administered 2016-03-08: 900 [IU]/h via INTRAVENOUS
  Administered 2016-03-09 – 2016-03-11 (×2): 750 [IU]/h via INTRAVENOUS
  Administered 2016-03-12: 850 [IU]/h via INTRAVENOUS
  Filled 2016-03-08 (×4): qty 250

## 2016-03-08 MED ORDER — CHLORHEXIDINE GLUCONATE 4 % EX LIQD
Freq: Every day | CUTANEOUS | Status: DC | PRN
  Filled 2016-03-08 (×2): qty 15

## 2016-03-08 MED ORDER — FAMOTIDINE IN NACL 20-0.9 MG/50ML-% IV SOLN
20.0000 mg | Freq: Two times a day (BID) | INTRAVENOUS | Status: DC
  Administered 2016-03-08 (×2): 20 mg via INTRAVENOUS
  Filled 2016-03-08 (×2): qty 50

## 2016-03-08 MED ORDER — CETYLPYRIDINIUM CHLORIDE 0.05 % MT LIQD
7.0000 mL | Freq: Two times a day (BID) | OROMUCOSAL | Status: DC
  Administered 2016-03-08 – 2016-03-13 (×9): 7 mL via OROMUCOSAL

## 2016-03-08 MED ORDER — IOPAMIDOL (ISOVUE-370) INJECTION 76%
INTRAVENOUS | Status: AC
  Administered 2016-03-08: 100 mL
  Filled 2016-03-08: qty 100

## 2016-03-08 NOTE — Progress Notes (Signed)
VASCULAR LAB PRELIMINARY  PRELIMINARY  PRELIMINARY  PRELIMINARY  Bilateral lower extremity venous duplex has been completed.    Findings consistent with acute deep vein thrombosis involving the right femoral vein, right popliteal vein, right peroneal vein, and left common femoral vein and Mid femoral vein.  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   Other finding in the left lower extremity at the proximal femoral section-cyst measures 3.5 cm x. 3.4 cm.    Janifer Adie, RVT, RDMS 03/08/2016, 3:39 PM

## 2016-03-08 NOTE — Progress Notes (Signed)
ANTICOAGULATION CONSULT NOTE - Initial Consult  Pharmacy Consult for Heparin Indication: pulmonary embolus, DVT and stroke  Allergies  Allergen Reactions  . Latex   . 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 [facility]    Patient Measurements: Height: 5\' 3"  (160 cm) Weight: 224 lb 3.3 oz (101.7 kg) IBW/kg (Calculated) : 52.4 Heparin Dosing Weight: 77.5 kg  Vital Signs: Temp: 97.6 F (36.4 C) (06/22 2000) Temp Source: Oral (06/22 2000) BP: 116/65 mmHg (06/22 2100) Pulse Rate: 82 (06/22 2100)  Labs:  Recent Labs  03/07/16 2249 03/07/16 2254  HGB 12.3 14.6  HCT 39.4 43.0  PLT 180  --   APTT 25  --   LABPROT 14.2  --   INR 1.08  --   CREATININE 0.78 0.70    Estimated Creatinine Clearance: 60.6 mL/min (by C-G formula based on Cr of 0.7).   Medical History: Past Medical History  Diagnosis Date  . Hypertension   . GERD (gastroesophageal reflux disease)   . Cancer [facility])     breast  . Seasonal allergies    Assessment:   82 yr old female admitted 03/07/16 pm with stroke.  Received tPA at 23:13 on 03/07/16.   Bilateral PE per chest CT; acute right leg DVT and chronic left leg DVT per venous dopplers.   To begin IV heparin without bolus 24hrs post-tPA.  No bleeding noted. RN reports bruising.   S/p MRI tonight, noted punctate hemorrhagic transformation, but benefits of anticoagulation outweigh risks per Neuro assessment. For stat CT head without contrast if any neuro changes.   To target lower end of therapeutic heparin level range.   Hgb 12.3, platelet count 180K.  Goal of Therapy:  Heparin level 0.3-0.5 units/ml Monitor platelets by anticoagulation protocol: Yes   Plan:   Begin heparin drip ~11:15pm at 900 units/hr (~12 units/kg adjusted body weight/hr)  Heparin level ~6 hrs after drip begins.  Daily heparin level and CBC while on heparin.  Monitor for any bleeding.  Arty Baumgartner, [place] Pager: [phone] 03/08/2016,10:18 PM

## 2016-03-08 NOTE — Progress Notes (Signed)
Initial Nutrition Assessment  DOCUMENTATION CODES:   Obesity unspecified  INTERVENTION:   If pt requires short term nutrition recommend: Jevity 1.2 @ 55 ml/hr 30 ml Prostat BID Provides: 1784 kcal, 103 grams protein, and 1070 ml H2O.   If pt able to start diet will supplement diet as needed.   NUTRITION DIAGNOSIS:   Inadequate oral intake related to dysphagia as evidenced by NPO status.  GOAL:   Patient will meet greater than or equal to 90% of their needs  MONITOR:   Diet advancement, I & O's  REASON FOR ASSESSMENT:   Malnutrition Screening Tool    ASSESSMENT:   [name] is a 82 y.o. female with history of hypertension, breast cancer obesity and recent exfoliative dermatitis presenting with garbled speech and left-sided weakness. She received IV t-PA for non-dominant right brain infarct embolic secondary to unknown source, suspicious for endocarditis d/t recent MSSA bacteremia.   Per daughter pt may have lost weight, the SNF had told her that she did. Per chart review usual weight is 240 lb. She is unable to state how well pt has been eating while at the SNF. Per daughter pt had went to SNF after her last stroke. She came home Friday and was eating well at home.   Labs reviewed Medications reviewed.  Nutrition-Focused physical exam completed. Findings are no fat depletion, no muscle depletion, and no edema.    Diet Order:  Diet NPO time specified  Skin:  Reviewed, no issues  Last BM:  unknown  Height:   Ht Readings from Last 1 Encounters:  03/08/16 5\' 3"  (1.6 m)    Weight:   Wt Readings from Last 1 Encounters:  03/08/16 224 lb 3.3 oz (101.7 kg)    Ideal Body Weight:  52.2 kg  BMI:  Body mass index is 39.73 kg/(m^2).  Estimated Nutritional Needs:   Kcal:  1700-1900  Protein:  100-115 grams  Fluid:  > 1.7 L/day  EDUCATION NEEDS:   No education needs identified at this time  [place], [place], [place] Pager [phone]  After Hours Pager'

## 2016-03-08 NOTE — Evaluation (Addendum)
Clinical/Bedside Swallow Evaluation Patient Details  Name: Champale Solari MRN: [id number] Date of Birth: 12-11-1930  Today's Date: 03/08/2016 Time: SLP Start Time (ACUTE ONLY): 1030 SLP Stop Time (ACUTE ONLY): 1053 SLP Time Calculation (min) (ACUTE ONLY): 23 min  Past Medical History:  Past Medical History  Diagnosis Date  . Hypertension   . GERD (gastroesophageal reflux disease)   . Cancer ([place])     breast  . Seasonal allergies    Past Surgical History: History reviewed. No pertinent past surgical history. HPI:  82 y.o. female with multiple risk factors for stroke presenting with acute right MCA territory ischemic infarction.  Pt has a history of dysphagia with throat clearing and oral residuals on left seen in 5/17, no objective test. Pt put on nectar thick liquids.   Assessment / Plan / Recommendation Clinical Impression  Pt demonstrates moderate CN VII and CN XII left sided weakness resulting in decreased oral managment of secretions and PO. Pt has persistent wet vocal quality and throat clearing indicating decreased airway protection with secretions, increased with PO administration. Unclear if this is worsened from baseline. Pt will need objective test to determine safety with PO intake prior to initiating diet given signs of aspiration and sigificant neuromuscular dysphagia. Recommend FEES given body habitus.     Aspiration Risk  Moderate aspiration risk    Diet Recommendation NPO        Other  Recommendations Oral Care Recommendations: Oral care QID   Follow up Recommendations    CIR   Frequency and Duration   2x week         Prognosis Prognosis for Safe Diet Advancement: Good      Swallow Study   General HPI: 82 y.o. female with multiple risk factors for stroke presenting with acute right MCA territory ischemic infarction. Type of Study: Bedside Swallow Evaluation Previous Swallow Assessment: none Diet Prior to this Study: NPO Temperature Spikes Noted:  No Respiratory Status: Room air History of Recent Intubation: No Behavior/Cognition: Alert;Cooperative;Pleasant mood Oral Care Completed by SLP: Yes Oral Cavity - Dentition: Edentulous;Dentures, not available Self-Feeding Abilities: Total assist;Needs assist (has some difficulty with hands at baseline) Patient Positioning: Postural control interferes with function Baseline Vocal Quality: Normal Volitional Cough: Congested Volitional Swallow: Able to elicit    Oral/Motor/Sensory Function Overall Oral Motor/Sensory Function: Moderate impairment Facial ROM: Reduced left;Suspected CN VII (facial) dysfunction Facial Symmetry: Abnormal symmetry left;Suspected CN VII (facial) dysfunction Facial Strength: Reduced left;Suspected CN VII (facial) dysfunction Facial Sensation: Reduced left;Suspected CN V (Trigeminal) dysfunction Lingual ROM: Reduced left;Suspected CN XII (hypoglossal) dysfunction Lingual Symmetry: Abnormal symmetry left;Suspected CN XII (hypoglossal) dysfunction Lingual Strength: Suspected CN XII (hypoglossal) dysfunction Lingual Sensation: Suspected CN VII (facial) dysfunction-anterior 2/3 tongue Mandible: Within Functional Limits   Ice Chips Ice chips: Impaired Presentation: Spoon Oral Phase Impairments: Reduced lingual movement/coordination;Reduced labial seal Oral Phase Functional Implications: Prolonged oral transit;Left lateral sulci pocketing;Left anterior spillage Pharyngeal Phase Impairments: Throat Clearing - Immediate;Wet Vocal Quality;Suspected delayed Swallow;Decreased hyoid-laryngeal movement   Thin Liquid Thin Liquid: Impaired Presentation: Cup;Self Fed Oral Phase Impairments: Reduced labial seal;Reduced lingual movement/coordination Oral Phase Functional Implications: Left anterior spillage;Left lateral sulci pocketing;Prolonged oral transit;Oral residue Pharyngeal  Phase Impairments: Suspected delayed Swallow;Decreased hyoid-laryngeal movement;Throat Clearing -  Delayed;Wet Vocal Quality    Nectar Thick Nectar Thick Liquid: Not tested   Honey Thick Honey Thick Liquid: Not tested   Puree Puree: Impaired Presentation: Spoon Oral Phase Impairments: Reduced labial seal;Reduced lingual movement/coordination Oral Phase Functional Implications: Prolonged oral transit;Left lateral sulci  pocketing;Left anterior spillage;Oral residue Pharyngeal Phase Impairments: Wet Vocal Quality;Throat Clearing - Delayed;Suspected delayed Swallow;Multiple swallows   Solid   GO   Solid: Not tested       Herbie Baltimore, MA CCC-SLP [phone]  Lynann Beaver 03/08/2016,12:00 PM

## 2016-03-08 NOTE — ED Notes (Signed)
Swallow screen not completed in ED d/t being flat for TPA administration

## 2016-03-08 NOTE — Progress Notes (Signed)
PT Cancellation Note  Patient Details Name: Sharon Velasquez MRN: [id number] DOB: May 13, 1931   Cancelled Treatment:    Reason Eval/Treat Not Completed: Patient not medically ready (active bedrest orders at this time)   Duncan Dull 03/08/2016, 7:36 AM Alben Deeds, PT DPT  [phone]

## 2016-03-08 NOTE — Progress Notes (Signed)
OT Cancellation Note  Patient Details Name: Sharon Velasquez MRN: [id number] DOB: 12/22/1930   Cancelled Treatment:    Reason Eval/Treat Not Completed: Patient not medically ready (active bedrest orders).  Binnie Kand M.S., OTR/L Pager: [phone]  03/08/2016, 7:21 AM

## 2016-03-08 NOTE — H&P (Signed)
Admission H&P    Chief Complaint: Acute onset of slurred speech and left-sided weakness.  HPI: Sharon Velasquez is an 82 y.o. female with a history of hypertension, breast cancer obesity and recent exfoliative dermatitis, brought to the emergency room and code stroke status following acute onset of garbled speech and left-sided weakness. Patient has no documented history of previous stroke. She's been on aspirin daily. CT scan of her head showed no acute findings. An area of encephalomalacia involving left cerebellum was noted and unchanged from previous studies. NIH stroke score was 15. She was deemed a candidate for TPA which was administered. Patient showed initial improvement with a return of voluntary movement involving left extremities, which subsequently regressed back to lack of voluntary movement. CT angiogram of head and neck was obtained which was unremarkable except for right M2 branch occlusion demonstrated on cranial study.  LSN: 9:00 PM on 03/07/2016 tPA Given: Yes mRankin:  Past Medical History  Diagnosis Date  . Hypertension   . GERD (gastroesophageal reflux disease)   . Cancer (HCC)     breast  . Seasonal allergies     History reviewed. No pertinent past surgical history.  Family History  Problem Relation Age of Onset  . Cancer Other    Social History:  reports that she has never smoked. She does not have any smokeless tobacco history on file. She reports that she does not drink alcohol. Her drug history is not on file.  Allergies:  Allergies  Allergen Reactions  . Latex   . 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 MAR    Medications Prior to Admission  Medication Sig Dispense Refill  . aspirin 81 MG chewable tablet Chew 81 mg by mouth daily.     . bisacodyl (DULCOLAX) 10 MG suppository Place 10 mg rectally as needed for moderate constipation (if not relieved by Milk Of Magnesia).    . Calcium Carbonate-Vitamin D 600-200 MG-UNIT CAPS Take 1 tablet by  mouth once daily    . camphor-menthol (SARNA) lotion Apply 1 application topically as needed for itching (dry skin). 222 mL 0  . exemestane (AROMASIN) 25 MG tablet Take 25 mg by mouth daily.     . feeding supplement, ENSURE ENLIVE, (ENSURE ENLIVE) LIQD Take 237 mLs by mouth 2 (two) times daily between meals. 237 mL 12  . ferrous sulfate 325 (65 FE) MG tablet Take 325 mg by mouth daily with breakfast.     . folic acid (FOLVITE) 1 MG tablet Take 1 mg by mouth daily.     . hydrOXYzine (ATARAX/VISTARIL) 25 MG tablet Take 25 mg by mouth 3 (three) times daily.     Marland Kitchen lactobacillus acidophilus (BACID) TABS tablet Take 2 tablets by mouth daily.    Marland Kitchen loratadine (CLARITIN) 10 MG tablet Take 10 mg by mouth daily.     . Maltodextrin-Xanthan Gum (RESOURCE THICKENUP CLEAR) POWD As needed for nectar consistency    . Multiple Vitamins-Minerals (MULTIVITAMIN WITH MINERALS) tablet Take 1 tablet by mouth daily.    . Omega-3 Fatty Acids (FISH OIL) 1000 MG CAPS Take 1,200 mg by mouth daily.     . pantoprazole (PROTONIX) 40 MG tablet Take 40 mg by mouth daily.    . Sodium Phosphates (RA SALINE ENEMA RE) Place 1 each rectally daily as needed (constipation not relieved by Milk of Magnesium or Bisacodyl Suppository).    . Vitamin D, Cholecalciferol, 400 units CAPS Give 1 capsule by mouth daily  ROS: Unavailable due to severe dysarthria.  Physical Examination: Blood pressure 144/122, pulse 92, temperature 97.4 F (36.3 C), temperature source Oral, resp. rate 21, height '5\' 3"'$  (1.6 m), weight 101.7 kg (224 lb 3.3 oz), SpO2 100 %.  HEENT-  Normocephalic, no lesions, without obvious abnormality.  Normal external eye and conjunctiva.  Normal TM's bilaterally.  Normal auditory canals and external ears. Normal external nose, mucus membranes and septum.  Normal pharynx. Neck supple with no masses, nodes, nodules or enlargement. Cardiovascular - regular rate and rhythm, S1, S2 normal, no murmur, click, rub or  gallop Lungs - chest clear, no wheezing, rales, normal symmetric air entry Abdomen - soft, non-tender; bowel sounds normal; no masses,  no organomegaly Extremities - no edema  Neurologic Examination: Mental Status: Alert, oriented, no acute distress.  Speech was markedly dysarthric without evidence of aphasia. Able to follow commands without difficulty. Cranial Nerves: II-left homonymous hemianopsia III/IV/VI-Pupils were equal and reacted. Extraocular movements were full and conjugate.    V/VII-neglect that left side; moderate left lower facial weakness. VIII-normal. X-marked dysarthria. XI: trapezius strength/neck flexion strength normal bilaterally XII-midline tongue extension with normal strength. Motor: Flaccid paralysis of left upper extremity; minimal distal voluntary movement of lower extremity; normal strength and tone of right extremities Sensory: Neglect of left side. Deep Tendon Reflexes: Absent throughout. Plantars: Flexor on the right and extensor on the left Cerebellar: Normal finger-to-nose testing. Carotid auscultation: Normal  Results for orders placed or performed during the hospital encounter of 03/07/16 (from the past 48 hour(s))  Ethanol     Status: None   Collection Time: 03/07/16 10:49 PM  Result Value Ref Range   Alcohol, Ethyl (B) <5 <5 mg/dL    Comment:        LOWEST DETECTABLE LIMIT FOR SERUM ALCOHOL IS 5 mg/dL FOR MEDICAL PURPOSES ONLY   Protime-INR     Status: None   Collection Time: 03/07/16 10:49 PM  Result Value Ref Range   Prothrombin Time 14.2 11.6 - 15.2 seconds   INR 1.08 0.00 - 1.49  APTT     Status: None   Collection Time: 03/07/16 10:49 PM  Result Value Ref Range   aPTT 25 24 - 37 seconds  CBC     Status: Abnormal   Collection Time: 03/07/16 10:49 PM  Result Value Ref Range   WBC 10.2 4.0 - 10.5 K/uL   RBC 4.52 3.87 - 5.11 MIL/uL   Hemoglobin 12.3 12.0 - 15.0 g/dL   HCT 39.4 36.0 - 46.0 %   MCV 87.2 78.0 - 100.0 fL   MCH 27.2  26.0 - 34.0 pg   MCHC 31.2 30.0 - 36.0 g/dL   RDW 18.6 (H) 11.5 - 15.5 %   Platelets 180 150 - 400 K/uL  Differential     Status: Abnormal   Collection Time: 03/07/16 10:49 PM  Result Value Ref Range   Neutrophils Relative % 36 %   Neutro Abs 3.6 1.7 - 7.7 K/uL   Lymphocytes Relative 47 %   Lymphs Abs 4.8 (H) 0.7 - 4.0 K/uL   Monocytes Relative 12 %   Monocytes Absolute 1.2 (H) 0.1 - 1.0 K/uL   Eosinophils Relative 5 %   Eosinophils Absolute 0.5 0.0 - 0.7 K/uL   Basophils Relative 1 %   Basophils Absolute 0.1 0.0 - 0.1 K/uL  Comprehensive metabolic panel     Status: Abnormal   Collection Time: 03/07/16 10:49 PM  Result Value Ref Range   Sodium 141 135 -  145 mmol/L   Potassium 4.2 3.5 - 5.1 mmol/L   Chloride 110 101 - 111 mmol/L   CO2 24 22 - 32 mmol/L   Glucose, Bld 134 (H) 65 - 99 mg/dL   BUN 16 6 - 20 mg/dL   Creatinine, Ser 9.57 0.44 - 1.00 mg/dL   Calcium 9.2 8.9 - 47.3 mg/dL   Total Protein 7.1 6.5 - 8.1 g/dL   Albumin 2.6 (L) 3.5 - 5.0 g/dL   AST 34 15 - 41 U/L   ALT 16 14 - 54 U/L   Alkaline Phosphatase 98 38 - 126 U/L   Total Bilirubin 0.2 (L) 0.3 - 1.2 mg/dL   GFR calc non Af Amer >60 >60 mL/min   GFR calc Af Amer >60 >60 mL/min    Comment: (NOTE) The eGFR has been calculated using the CKD EPI equation. This calculation has not been validated in all clinical situations. eGFR's persistently <60 mL/min signify possible Chronic Kidney Disease.    Anion gap 7 5 - 15  I-stat troponin, ED (not at [facility], [facility])     Status: None   Collection Time: 03/07/16 10:53 PM  Result Value Ref Range   Troponin i, poc 0.01 0.00 - 0.08 ng/mL   Comment 3            Comment: Due to the release kinetics of cTnI, a negative result within the first hours of the onset of symptoms does not rule out myocardial infarction with certainty. If myocardial infarction is still suspected, repeat the test at appropriate intervals.   I-Stat Chem 8, ED  (not at [facility], [facility])     Status: Abnormal    Collection Time: 03/07/16 10:54 PM  Result Value Ref Range   Sodium 144 135 - 145 mmol/L   Potassium 4.2 3.5 - 5.1 mmol/L   Chloride 109 101 - 111 mmol/L   BUN 19 6 - 20 mg/dL   Creatinine, Ser 4.03 0.44 - 1.00 mg/dL   Glucose, Bld 709 (H) 65 - 99 mg/dL   Calcium, Ion 6.43 8.38 - 1.30 mmol/L   TCO2 24 0 - 100 mmol/L   Hemoglobin 14.6 12.0 - 15.0 g/dL   HCT 18.4 03.7 - 54.3 %   Ct Angio Head W/cm &/or Wo Cm  03/08/2016  EXAM: CT ANGIOGRAPHY HEAD AND NECK TECHNIQUE: Multidetector CT imaging of the head and neck was performed using the standard protocol during bolus administration of intravenous contrast. Multiplanar CT image reconstructions and MIPs were obtained to evaluate the vascular anatomy. Carotid stenosis measurements (when applicable) are obtained utilizing NASCET criteria, using the distal internal carotid diameter as the denominator. CONTRAST:  50 cc of Isovue 370 COMPARISON:  Noncontrast head CT from earlier same day FINDINGS: CTA NECK Aortic arch: Aortic arch of normal caliber with normal branch pattern. No high-grade stenosis at the origin of the great vessels. Visualized subclavian arteries patent. Scattered plaque within the arch itself and at the origin of the great vessels. Right carotid system: Right common carotid artery patent from its origin to the bifurcation peer scattered a centric plaque about the right bifurcation without flow-limiting stenosis. Right ICA patent from the bifurcation to the skullbase without stenosis, dissection, or occlusion. Medialization of the right carotid artery system into the retropharyngeal space. Left carotid system: Left common carotid artery patent from its origin to the bifurcation. No significant plaque about the left bifurcation. Left ICA patent from the bifurcation to the skullbase without stenosis, dissection, or occlusion. Medialization of the left  carotid artery system into the retropharyngeal space. Vertebral arteries:Both vertebral arteries  arise from the subclavian arteries. Vertebral arteries patent within the neck without stenosis, dissection, or occlusion. Skeleton: No acute osseous abnormality. No worrisome lytic or blastic osseous lesions. Other neck: Partially visualized lungs are grossly clear. There is question of a centric filling defects within the proximal main pulmonary arteries bilaterally (series 401, image 7), which may reflect pulmonary emboli. These the most consistent with subacute to chronic in nature given their a centric appearance. This is not entirely certain, as this soft tissue density main lie external to the vasculature are. Remainder of the visualized superior mediastinum within normal limits. Thyroid grossly unremarkable. No adenopathy within the neck. No acute soft tissue abnormality. CTA HEAD Anterior circulation: Petrous segments patent bilaterally. Scattered calcified plaque within the cavernous/ supraclinoid ICAs with secondary mild multi focal narrowing. A1 segments patent. Left A1 segment hypoplastic. Anterior communicating artery normal. Anterior cerebral arteries well opacified. Left M1 segment widely patent. Left MCA bifurcation normal. Distal left MCA branches well opacified. Right M1 segment patent. Right MCA bifurcation normal. There is abrupt occlusion of a proximal right M2 branch, anterior division. Attenuated/absent flow distally. Right MCA branches otherwise opacified. Posterior circulation: Vertebral arteries patent to the vertebrobasilar junction. Basilar artery widely patent. Superior cerebral arteries patent bilaterally. P1 segments hypoplastic. Prominent bilateral posterior communicating arteries. Posterior cerebral arteries well opacified to their distal aspects. Venous sinuses: Patent without evidence for venous sinus thrombosis. Anatomic variants: Hypoplastic vertebrobasilar system with prominent bilateral posterior communicating arteries. No aneurysm or vascular malformation. Delayed phase: Not  performed. IMPRESSION: CTA NECK IMPRESSION: 1. No high-grade or critical stenosis within the major arterial vasculature of the neck. 2. Mild atheromatous plaque about the right bifurcation without significant stenosis. 3. Question soft tissue density within the in pulmonary arteries bilaterally, which may reflect pulmonary emboli, not well evaluated on this exam. Correlation with symptomatology recommended. Additionally, this could be further assessed with dedicated PE protocol CT. CTA HEAD IMPRESSION: 1. Acute proximal right M2 branch occlusion, anterior division. 2. No other large or proximal arterial branch occlusion within the intracranial circulation. 3. Atheromatous plaque throughout the cavernous ICAs with mild multi focal narrowing. No high-grade or correctable stenosis. Critical Value/emergent results were called by telephone at the time of interpretation on 03/08/2016 at 12:26 am to Dr. Dina Rich, who verbally acknowledged these results. Electronically Signed   By: Jeannine Boga M.D.   On: 03/08/2016 00:49   Ct Angio Neck W Or Wo Contrast  03/08/2016  EXAM: CT ANGIOGRAPHY HEAD AND NECK TECHNIQUE: Multidetector CT imaging of the head and neck was performed using the standard protocol during bolus administration of intravenous contrast. Multiplanar CT image reconstructions and MIPs were obtained to evaluate the vascular anatomy. Carotid stenosis measurements (when applicable) are obtained utilizing NASCET criteria, using the distal internal carotid diameter as the denominator. CONTRAST:  50 cc of Isovue 370 COMPARISON:  Noncontrast head CT from earlier same day FINDINGS: CTA NECK Aortic arch: Aortic arch of normal caliber with normal branch pattern. No high-grade stenosis at the origin of the great vessels. Visualized subclavian arteries patent. Scattered plaque within the arch itself and at the origin of the great vessels. Right carotid system: Right common carotid artery patent from its origin to the  bifurcation peer scattered a centric plaque about the right bifurcation without flow-limiting stenosis. Right ICA patent from the bifurcation to the skullbase without stenosis, dissection, or occlusion. Medialization of the right carotid artery system into the retropharyngeal space.  Left carotid system: Left common carotid artery patent from its origin to the bifurcation. No significant plaque about the left bifurcation. Left ICA patent from the bifurcation to the skullbase without stenosis, dissection, or occlusion. Medialization of the left carotid artery system into the retropharyngeal space. Vertebral arteries:Both vertebral arteries arise from the subclavian arteries. Vertebral arteries patent within the neck without stenosis, dissection, or occlusion. Skeleton: No acute osseous abnormality. No worrisome lytic or blastic osseous lesions. Other neck: Partially visualized lungs are grossly clear. There is question of a centric filling defects within the proximal main pulmonary arteries bilaterally (series 401, image 7), which may reflect pulmonary emboli. These the most consistent with subacute to chronic in nature given their a centric appearance. This is not entirely certain, as this soft tissue density main lie external to the vasculature are. Remainder of the visualized superior mediastinum within normal limits. Thyroid grossly unremarkable. No adenopathy within the neck. No acute soft tissue abnormality. CTA HEAD Anterior circulation: Petrous segments patent bilaterally. Scattered calcified plaque within the cavernous/ supraclinoid ICAs with secondary mild multi focal narrowing. A1 segments patent. Left A1 segment hypoplastic. Anterior communicating artery normal. Anterior cerebral arteries well opacified. Left M1 segment widely patent. Left MCA bifurcation normal. Distal left MCA branches well opacified. Right M1 segment patent. Right MCA bifurcation normal. There is abrupt occlusion of a proximal right M2  branch, anterior division. Attenuated/absent flow distally. Right MCA branches otherwise opacified. Posterior circulation: Vertebral arteries patent to the vertebrobasilar junction. Basilar artery widely patent. Superior cerebral arteries patent bilaterally. P1 segments hypoplastic. Prominent bilateral posterior communicating arteries. Posterior cerebral arteries well opacified to their distal aspects. Venous sinuses: Patent without evidence for venous sinus thrombosis. Anatomic variants: Hypoplastic vertebrobasilar system with prominent bilateral posterior communicating arteries. No aneurysm or vascular malformation. Delayed phase: Not performed. IMPRESSION: CTA NECK IMPRESSION: 1. No high-grade or critical stenosis within the major arterial vasculature of the neck. 2. Mild atheromatous plaque about the right bifurcation without significant stenosis. 3. Question soft tissue density within the in pulmonary arteries bilaterally, which may reflect pulmonary emboli, not well evaluated on this exam. Correlation with symptomatology recommended. Additionally, this could be further assessed with dedicated PE protocol CT. CTA HEAD IMPRESSION: 1. Acute proximal right M2 branch occlusion, anterior division. 2. No other large or proximal arterial branch occlusion within the intracranial circulation. 3. Atheromatous plaque throughout the cavernous ICAs with mild multi focal narrowing. No high-grade or correctable stenosis. Critical Value/emergent results were called by telephone at the time of interpretation on 03/08/2016 at 12:26 am to Dr. Wilkie Aye, who verbally acknowledged these results. Electronically Signed   By: Rise Mu M.D.   On: 03/08/2016 00:49   Ct Head Code Stroke W/o Cm  03/07/2016  CLINICAL DATA:  CODE STROKE PT WITH LEFT SIDE WEAKNESS, RIGHT SIDE GAZE CALL REPORT TO DR Roseanne Reno [phone] EXAM: CT HEAD WITHOUT CONTRAST TECHNIQUE: Contiguous axial images were obtained from the base of the skull  through the vertex without intravenous contrast. COMPARISON:  MR 02/01/2016 and previous FINDINGS: Brain: Mild atrophy. Stable encephalomalacia in the left cerebellar hemisphere. No evidence of acute infarction, hemorrhage, extra-axial collection, ventriculomegaly, or mass effect. Vascular: No hyperdense vessel or unexpected calcification. Atherosclerotic and physiologic intracranial calcifications. Skull: Negative for fracture or focal lesion. Sinuses/Orbits: No acute findings. Other: None. IMPRESSION: 1. Negative for bleed or other acute intracranial process. 2. Atrophy with chronic left cerebellar encephalomalacia. Critical Value/emergent results were called by telephone at the time of interpretation on 03/07/2016 at 10:50 pm to  Dr. Nicole Kindred, who verbally acknowledged these results. Electronically Signed   By: Lucrezia Europe M.D.   On: 03/07/2016 22:58    Assessment: 82 y.o. female with multiple risk factors for stroke presenting with acute right MCA territory ischemic infarction.  Stroke Risk Factors - family history and hypertension  Plan: 1. HgbA1c, fasting lipid panel 2. MRI of the brain without contrast 3. PT consult, OT consult, Speech consult 4. Echocardiogram 5. Prophylactic therapy-Antiplatelet med: Aspirin if CT scan of the wound 4 hours post TPA administration shows no intracranial hemorrhage 6. Risk factor modification 7. Telemetry monitoring  This patient is critically ill and at significant risk of neurological worsening or death, and care requires constant monitoring of vital signs, hemodynamics,respiratory and cardiac monitoring, neurological assessment, discussion with family, other specialists and medical decision making of high complexity. Total critical care time was 90 minutes.  C.R. Nicole Kindred, MD [facility] [phone]  03/08/2016, 1:23 AM

## 2016-03-08 NOTE — ED Provider Notes (Signed)
CSN: [id number]     Arrival date & time 03/07/16  2233 History   First MD Initiated Contact with Patient 03/07/16 2239     Chief Complaint  Patient presents with  . Code Stroke    An emergency department physician performed an initial assessment on this suspected stroke patient at 2235.  Level V caveat: Dysarthria  HPI Patient is brought to the emergency department as a code stroke with acute onset development of left-sided weakness and left-sided facial droop with right gaze preference.  Last known well at 2123.  No prior history of stroke.  Patient is bedbound at baseline.  Blood sugar was 113.  No seizure activity noted   Past Medical History  Diagnosis Date  . Hypertension   . GERD (gastroesophageal reflux disease)   . Cancer (HCC)     breast  . Seasonal allergies    History reviewed. No pertinent past surgical history. Family History  Problem Relation Age of Onset  . Cancer Other    Social History  Substance Use Topics  . Smoking status: Never Smoker   . Smokeless tobacco: None  . Alcohol Use: No   OB History    No data available     Review of Systems  Unable to perform ROS: Patient nonverbal      Allergies  Latex and Other  Home Medications   Prior to Admission medications   Medication Sig Start Date End Date Taking? Authorizing Provider  aspirin 81 MG chewable tablet Chew 81 mg by mouth daily.     Historical Provider, MD  bisacodyl (DULCOLAX) 10 MG suppository Place 10 mg rectally as needed for moderate constipation (if not relieved by Milk Of Magnesia).    Historical Provider, MD  Calcium Carbonate-Vitamin D 600-200 MG-UNIT CAPS Take 1 tablet by mouth once daily    Historical Provider, MD  camphor-menthol [facility]) lotion Apply 1 application topically as needed for itching (dry skin). 02/06/16   Geradine Girt, DO  exemestane (AROMASIN) 25 MG tablet Take 25 mg by mouth daily.     Historical Provider, MD  feeding supplement, ENSURE ENLIVE, (ENSURE ENLIVE)  LIQD Take 237 mLs by mouth 2 (two) times daily between meals. 02/06/16   Geradine Girt, DO  ferrous sulfate 325 (65 FE) MG tablet Take 325 mg by mouth daily with breakfast.     Historical Provider, MD  folic acid (FOLVITE) 1 MG tablet Take 1 mg by mouth daily.     Historical Provider, MD  hydrOXYzine (ATARAX/VISTARIL) 25 MG tablet Take 25 mg by mouth 3 (three) times daily.  12/29/15   Historical Provider, MD  lactobacillus acidophilus (BACID) TABS tablet Take 2 tablets by mouth daily.    Historical Provider, MD  loratadine (CLARITIN) 10 MG tablet Take 10 mg by mouth daily.     Historical Provider, MD  Maltodextrin-Xanthan Gum ([place]) POWD As needed for nectar consistency 02/06/16   Geradine Girt, DO  Multiple Vitamins-Minerals (MULTIVITAMIN WITH MINERALS) tablet Take 1 tablet by mouth daily.    Historical Provider, MD  Omega-3 Fatty Acids (FISH OIL) 1000 MG CAPS Take 1,200 mg by mouth daily.     Historical Provider, MD  pantoprazole (PROTONIX) 40 MG tablet Take 40 mg by mouth daily.    Historical Provider, MD  Sodium Phosphates (RA SALINE ENEMA RE) Place 1 each rectally daily as needed (constipation not relieved by Milk of Magnesium or Bisacodyl Suppository).    Historical Provider, MD  Vitamin D, Cholecalciferol, 400  units CAPS Give 1 capsule by mouth daily    Historical Provider, MD   BP 143/72 mmHg  Pulse 90  Temp(Src) 97.8 F (36.6 C) (Rectal)  Resp 20  Ht 5\' 3"  (1.6 m)  Wt 209 lb 14.1 oz (95.2 kg)  BMI 37.19 kg/m2  SpO2 95% Physical Exam  Constitutional: She appears well-developed and well-nourished. No distress.  HENT:  Head: Normocephalic and atraumatic.  Eyes: EOM are normal.  Neck: Normal range of motion.  Cardiovascular: Normal rate, regular rhythm and normal heart sounds.   Pulmonary/Chest: Effort normal and breath sounds normal.  Abdominal: Soft. She exhibits no distension. There is no tenderness.  Musculoskeletal: Normal range of motion.  Neurological:  She is alert.  Right gaze preference.  Flaccid left upper and left lower extremity  Skin: Skin is warm and dry.  Psychiatric: She has a normal mood and affect. Judgment normal.  Nursing note and vitals reviewed.   ED Course  Procedures (including critical care time)   +++++++++++++++++++++++++++++++++++++++++++++++++++++++++++++  CRITICAL CARE Performed by: Hoy Morn Total critical care time: 32 minutes Critical care time was exclusive of separately billable procedures and treating other patients. Critical care was necessary to treat or prevent imminent or life-threatening deterioration. Critical care was time spent personally by me on the following activities: development of treatment plan with patient and/or surrogate as well as nursing, discussions with consultants, evaluation of patient's response to treatment, examination of patient, obtaining history from patient or surrogate, ordering and performing treatments and interventions, ordering and review of laboratory studies, ordering and review of radiographic studies, pulse oximetry and re-evaluation of patient's condition.  ++++++++++++++++++++++++++++++++++++++++++++++++++++++++++++++++    Labs Review Labs Reviewed  CBC - Abnormal; Notable for the following:    RDW 18.6 (*)    All other components within normal limits  DIFFERENTIAL - Abnormal; Notable for the following:    Lymphs Abs 4.8 (*)    Monocytes Absolute 1.2 (*)    All other components within normal limits  COMPREHENSIVE METABOLIC PANEL - Abnormal; Notable for the following:    Glucose, Bld 134 (*)    Albumin 2.6 (*)    Total Bilirubin 0.2 (*)    All other components within normal limits  I-STAT CHEM 8, ED - Abnormal; Notable for the following:    Glucose, Bld 131 (*)    All other components within normal limits  MRSA PCR SCREENING  ETHANOL  PROTIME-INR  APTT  URINE RAPID DRUG SCREEN, HOSP PERFORMED  URINALYSIS, ROUTINE W REFLEX MICROSCOPIC (NOT AT  [facility])  HEMOGLOBIN A1C  LIPID PANEL  I-STAT TROPOININ, ED    Imaging Review Ct Angio Head W/cm &/or Wo Cm  03/08/2016  EXAM: CT ANGIOGRAPHY HEAD AND NECK TECHNIQUE: Multidetector CT imaging of the head and neck was performed using the standard protocol during bolus administration of intravenous contrast. Multiplanar CT image reconstructions and MIPs were obtained to evaluate the vascular anatomy. Carotid stenosis measurements (when applicable) are obtained utilizing NASCET criteria, using the distal internal carotid diameter as the denominator. CONTRAST:  50 cc of Isovue 370 COMPARISON:  Noncontrast head CT from earlier same day FINDINGS: CTA NECK Aortic arch: Aortic arch of normal caliber with normal branch pattern. No high-grade stenosis at the origin of the great vessels. Visualized subclavian arteries patent. Scattered plaque within the arch itself and at the origin of the great vessels. Right carotid system: Right common carotid artery patent from its origin to the bifurcation peer scattered a centric plaque about the right bifurcation without  flow-limiting stenosis. Right ICA patent from the bifurcation to the skullbase without stenosis, dissection, or occlusion. Medialization of the right carotid artery system into the retropharyngeal space. Left carotid system: Left common carotid artery patent from its origin to the bifurcation. No significant plaque about the left bifurcation. Left ICA patent from the bifurcation to the skullbase without stenosis, dissection, or occlusion. Medialization of the left carotid artery system into the retropharyngeal space. Vertebral arteries:Both vertebral arteries arise from the subclavian arteries. Vertebral arteries patent within the neck without stenosis, dissection, or occlusion. Skeleton: No acute osseous abnormality. No worrisome lytic or blastic osseous lesions. Other neck: Partially visualized lungs are grossly clear. There is question of a centric filling  defects within the proximal main pulmonary arteries bilaterally (series 401, image 7), which may reflect pulmonary emboli. These the most consistent with subacute to chronic in nature given their a centric appearance. This is not entirely certain, as this soft tissue density main lie external to the vasculature are. Remainder of the visualized superior mediastinum within normal limits. Thyroid grossly unremarkable. No adenopathy within the neck. No acute soft tissue abnormality. CTA HEAD Anterior circulation: Petrous segments patent bilaterally. Scattered calcified plaque within the cavernous/ supraclinoid ICAs with secondary mild multi focal narrowing. A1 segments patent. Left A1 segment hypoplastic. Anterior communicating artery normal. Anterior cerebral arteries well opacified. Left M1 segment widely patent. Left MCA bifurcation normal. Distal left MCA branches well opacified. Right M1 segment patent. Right MCA bifurcation normal. There is abrupt occlusion of a proximal right M2 branch, anterior division. Attenuated/absent flow distally. Right MCA branches otherwise opacified. Posterior circulation: Vertebral arteries patent to the vertebrobasilar junction. Basilar artery widely patent. Superior cerebral arteries patent bilaterally. P1 segments hypoplastic. Prominent bilateral posterior communicating arteries. Posterior cerebral arteries well opacified to their distal aspects. Venous sinuses: Patent without evidence for venous sinus thrombosis. Anatomic variants: Hypoplastic vertebrobasilar system with prominent bilateral posterior communicating arteries. No aneurysm or vascular malformation. Delayed phase: Not performed. IMPRESSION: CTA NECK IMPRESSION: 1. No high-grade or critical stenosis within the major arterial vasculature of the neck. 2. Mild atheromatous plaque about the right bifurcation without significant stenosis. 3. Question soft tissue density within the in pulmonary arteries bilaterally, which may  reflect pulmonary emboli, not well evaluated on this exam. Correlation with symptomatology recommended. Additionally, this could be further assessed with dedicated PE protocol CT. CTA HEAD IMPRESSION: 1. Acute proximal right M2 branch occlusion, anterior division. 2. No other large or proximal arterial branch occlusion within the intracranial circulation. 3. Atheromatous plaque throughout the cavernous ICAs with mild multi focal narrowing. No high-grade or correctable stenosis. Critical Value/emergent results were called by telephone at the time of interpretation on 03/08/2016 at 12:26 am to Dr. Dina Rich, who verbally acknowledged these results. Electronically Signed   By: Jeannine Boga M.D.   On: 03/08/2016 00:49   Ct Angio Neck W Or Wo Contrast  03/08/2016  EXAM: CT ANGIOGRAPHY HEAD AND NECK TECHNIQUE: Multidetector CT imaging of the head and neck was performed using the standard protocol during bolus administration of intravenous contrast. Multiplanar CT image reconstructions and MIPs were obtained to evaluate the vascular anatomy. Carotid stenosis measurements (when applicable) are obtained utilizing NASCET criteria, using the distal internal carotid diameter as the denominator. CONTRAST:  50 cc of Isovue 370 COMPARISON:  Noncontrast head CT from earlier same day FINDINGS: CTA NECK Aortic arch: Aortic arch of normal caliber with normal branch pattern. No high-grade stenosis at the origin of the great vessels. Visualized subclavian arteries patent.  Scattered plaque within the arch itself and at the origin of the great vessels. Right carotid system: Right common carotid artery patent from its origin to the bifurcation peer scattered a centric plaque about the right bifurcation without flow-limiting stenosis. Right ICA patent from the bifurcation to the skullbase without stenosis, dissection, or occlusion. Medialization of the right carotid artery system into the retropharyngeal space. Left carotid system:  Left common carotid artery patent from its origin to the bifurcation. No significant plaque about the left bifurcation. Left ICA patent from the bifurcation to the skullbase without stenosis, dissection, or occlusion. Medialization of the left carotid artery system into the retropharyngeal space. Vertebral arteries:Both vertebral arteries arise from the subclavian arteries. Vertebral arteries patent within the neck without stenosis, dissection, or occlusion. Skeleton: No acute osseous abnormality. No worrisome lytic or blastic osseous lesions. Other neck: Partially visualized lungs are grossly clear. There is question of a centric filling defects within the proximal main pulmonary arteries bilaterally (series 401, image 7), which may reflect pulmonary emboli. These the most consistent with subacute to chronic in nature given their a centric appearance. This is not entirely certain, as this soft tissue density main lie external to the vasculature are. Remainder of the visualized superior mediastinum within normal limits. Thyroid grossly unremarkable. No adenopathy within the neck. No acute soft tissue abnormality. CTA HEAD Anterior circulation: Petrous segments patent bilaterally. Scattered calcified plaque within the cavernous/ supraclinoid ICAs with secondary mild multi focal narrowing. A1 segments patent. Left A1 segment hypoplastic. Anterior communicating artery normal. Anterior cerebral arteries well opacified. Left M1 segment widely patent. Left MCA bifurcation normal. Distal left MCA branches well opacified. Right M1 segment patent. Right MCA bifurcation normal. There is abrupt occlusion of a proximal right M2 branch, anterior division. Attenuated/absent flow distally. Right MCA branches otherwise opacified. Posterior circulation: Vertebral arteries patent to the vertebrobasilar junction. Basilar artery widely patent. Superior cerebral arteries patent bilaterally. P1 segments hypoplastic. Prominent bilateral  posterior communicating arteries. Posterior cerebral arteries well opacified to their distal aspects. Venous sinuses: Patent without evidence for venous sinus thrombosis. Anatomic variants: Hypoplastic vertebrobasilar system with prominent bilateral posterior communicating arteries. No aneurysm or vascular malformation. Delayed phase: Not performed. IMPRESSION: CTA NECK IMPRESSION: 1. No high-grade or critical stenosis within the major arterial vasculature of the neck. 2. Mild atheromatous plaque about the right bifurcation without significant stenosis. 3. Question soft tissue density within the in pulmonary arteries bilaterally, which may reflect pulmonary emboli, not well evaluated on this exam. Correlation with symptomatology recommended. Additionally, this could be further assessed with dedicated PE protocol CT. CTA HEAD IMPRESSION: 1. Acute proximal right M2 branch occlusion, anterior division. 2. No other large or proximal arterial branch occlusion within the intracranial circulation. 3. Atheromatous plaque throughout the cavernous ICAs with mild multi focal narrowing. No high-grade or correctable stenosis. Critical Value/emergent results were called by telephone at the time of interpretation on 03/08/2016 at 12:26 am to Dr. Dina Rich, who verbally acknowledged these results. Electronically Signed   By: Jeannine Boga M.D.   On: 03/08/2016 00:49   Ct Head Code Stroke W/o Cm  03/07/2016  CLINICAL DATA:  CODE STROKE PT WITH LEFT SIDE WEAKNESS, RIGHT SIDE GAZE CALL REPORT TO DR Nicole Kindred [phone] EXAM: CT HEAD WITHOUT CONTRAST TECHNIQUE: Contiguous axial images were obtained from the base of the skull through the vertex without intravenous contrast. COMPARISON:  MR 02/01/2016 and previous FINDINGS: Brain: Mild atrophy. Stable encephalomalacia in the left cerebellar hemisphere. No evidence of acute infarction, hemorrhage, extra-axial  collection, ventriculomegaly, or mass effect. Vascular: No hyperdense  vessel or unexpected calcification. Atherosclerotic and physiologic intracranial calcifications. Skull: Negative for fracture or focal lesion. Sinuses/Orbits: No acute findings. Other: None. IMPRESSION: 1. Negative for bleed or other acute intracranial process. 2. Atrophy with chronic left cerebellar encephalomalacia. Critical Value/emergent results were called by telephone at the time of interpretation on 03/07/2016 at 10:50 pm to Dr. Nicole Kindred, who verbally acknowledged these results. Electronically Signed   By: Lucrezia Europe M.D.   On: 03/07/2016 22:58   I have personally reviewed and evaluated these images and lab results as part of my medical decision-making.   EKG Interpretation None      MDM   Final diagnoses:  Cerebrovascular accident (CVA) due to occlusion of cerebral artery [facility])    Patient be admitted to the intensive care unit.  She was seen on arrival while myself and taken emergently to head CT.  She presented as a code stroke.  She will receive IV TPA.  Dr. Wallie Char of neurology is involved in her care.  We will continue to follow the patient closely while she is in the emergency department.    Jola Schmidt, MD 03/08/16 [id number]

## 2016-03-08 NOTE — Progress Notes (Signed)
[facility] Progress Note Patient Name: Sharon Velasquez DOB: 04/18/1931 MRN: [id number]   Date of Service  03/08/2016  HPI/Events of Note  ELINK New patient eval: Code stroke s/p tpa Left sided weakness and facial droop with right gaze  eICU Interventions  bp control F/u MRI head Cont with management per primary team  No respiratory distress on cam check       Intervention Category Evaluation Type: New Patient Evaluation  Sharon Velasquez 03/08/2016, 12:57 AM

## 2016-03-08 NOTE — Evaluation (Signed)
Speech Language Pathology Evaluation Patient Details Name: Sharon Velasquez MRN: [id number] DOB: May 14, 1931 Today's Date: 03/08/2016 Time: [id number] SLP Time Calculation (min) (ACUTE ONLY): 143 min  Problem List:  Patient Active Problem List   Diagnosis Date Noted  . CVA (cerebral infarction) 03/07/2016  . 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 Hypotension 02/13/2016  . Iron deficiency anemia 02/13/2016  . Thrombocytopenia ([place]) 02/13/2016  . Hypothermia   . Malnutrition of moderate degree 02/02/2016  . MSSA (methicillin susceptible Staphylococcus aureus) infection 01/31/2016  . Acute encephalopathy 01/31/2016  . UTI (lower urinary tract infection) 01/30/2016  . Hypernatremia 01/30/2016  . Vitamin D deficiency 01/07/2016  . Hypertension   . GERD (gastroesophageal reflux disease)   . Cancer ([place])   . Seasonal allergies   . Stevens-Johnson syndrome ([place]) 12/29/2015  . Candida infection of mouth 12/28/2015  . Colonic constipation 12/28/2015  . Acidosis, hyperchloremic 12/26/2015  . Elevated WBC count 12/26/2015  . Malignant neoplasm of female breast ([place]) 12/25/2015  . Erythematous condition 12/25/2015  . Morbid (severe) obesity due to excess calories ([place]) 12/25/2015  . Malignant neoplasm of kidney ([place]) 05/05/2014   Past Medical History:  Past Medical History  Diagnosis Date  . Hypertension   . GERD (gastroesophageal reflux disease)   . Cancer ([place])     breast  . Seasonal allergies    Past Surgical History: History reviewed. No pertinent past surgical history. HPI:  82 y.o. female with multiple risk factors for stroke presenting with acute right MCA territory ischemic infarction.   Assessment / Plan / Recommendation Clinical Impression  Pt demonstrates moderate to severe dysarthria due to left CN VII and CN XII weakness. Pt was able to complete basic cognitive lingusitic tasks appropriately, attended to left visual field without cueing. Pt will need ongoing SLP  interventions for speech intelligibility, recomemnd CIR at dc.     SLP Assessment  Patient needs continued Speech Lanaguage Pathology Services    Follow Up Recommendations  Inpatient Rehab    Frequency and Duration min 2x/week  2 weeks      SLP Evaluation Prior Functioning  Cognitive/Linguistic Baseline: Information not available  Lives With: Daughter   Cognition  Overall Cognitive Status: Within Functional Limits for tasks assessed Arousal/Alertness: Awake/alert Orientation Level: Oriented X4 Attention: Alternating Alternating Attention: Appears intact Memory: Appears intact Awareness: Appears intact Problem Solving: Appears intact    Comprehension  Auditory Comprehension Overall Auditory Comprehension: Appears within functional limits for tasks assessed Reading Comprehension Reading Status: Within funtional limits    Expression Verbal Expression Overall Verbal Expression: Appears within functional limits for tasks assessed   Oral / Motor  Oral Motor/Sensory Function Overall Oral Motor/Sensory Function: Moderate impairment Facial ROM: Reduced left;Suspected CN VII (facial) dysfunction Facial Symmetry: Abnormal symmetry left;Suspected CN VII (facial) dysfunction Facial Strength: Reduced left;Suspected CN VII (facial) dysfunction Facial Sensation: Reduced left;Suspected CN V (Trigeminal) dysfunction Lingual ROM: Reduced left;Suspected CN XII (hypoglossal) dysfunction Lingual Symmetry: Abnormal symmetry left;Suspected CN XII (hypoglossal) dysfunction Lingual Strength: Suspected CN XII (hypoglossal) dysfunction Lingual Sensation: Suspected CN VII (facial) dysfunction-anterior 2/3 tongue Mandible: Within Functional Limits Motor Speech Overall Motor Speech: Impaired Respiration: Within functional limits Phonation: Wet Resonance: Hypernasality Articulation: Impaired Level of Impairment: Word Intelligibility: Intelligibility reduced Word: 25-49% accurate Phrase:  25-49% accurate Sentence: 25-49% accurate Conversation: 25-49% accurate Motor Planning: Witnin functional limits Motor Speech Errors: Aware Interfering Components: Inadequate dentition Effective Techniques: Over-articulate;Slow rate;Pacing   GO  Herbie [place], [place] CCC-SLP [phone]  Lynann Beaver 03/08/2016, 12:07 PM

## 2016-03-08 NOTE — Plan of Care (Signed)
MRI image reviewed. Official report pending. Right MCA infarct, borderline small to moderate size, with punctate hemorrhagic transformation. Considering pt bilateral DVT with bilateral PE, I think anticoagulation 24 hours post tPA is reasonable and benefits outweigh risks. Will order heparin drip without bolus and low intensity with heparin level 0.3-0.5. Stat CT head without contrast if any neuro changes.   Rosalin Hawking, MD PhD Stroke Neurology 03/08/2016 10:09 PM

## 2016-03-08 NOTE — Progress Notes (Signed)
STROKE TEAM PROGRESS NOTE   HISTORY OF PRESENT ILLNESS (per record) Sharon Velasquez is an 82 y.o. female with a history of hypertension, breast cancer obesity and recent exfoliative dermatitis, brought to the emergency room and code stroke status following acute onset of garbled speech and left-sided weakness. Patient has no documented history of previous stroke. She's been on aspirin daily. CT scan of her head showed no acute findings. An area of encephalomalacia involving left cerebellum was noted and unchanged from previous studies. NIH stroke score was 15. She was deemed a candidate for TPA which was administered. Patient showed initial improvement with a return of voluntary movement involving left extremities, which subsequently regressed back to lack of voluntary movement. CT angiogram of head and neck was obtained which was unremarkable except for right M2 branch occlusion demonstrated on cranial study. She was LKW at 9:00 PM on 03/07/2016. CTA showed no emergent large vessel occlusion.  She was admitted to the neuro ICU for further evaluation and treatment.   SUBJECTIVE (INTERVAL HISTORY) Her RN is at the bedside. No family present. Pt just back from CT chest to look for PE seen on CTA neck.. No new complaints. MRI pending. She is very dysarthric, left facial droop. LE venous doppler showed bilateral acute and chronic DVT.    OBJECTIVE Temp:  [97.3 F (36.3 C)-97.8 F (36.6 C)] 97.5 F (36.4 C) (06/22 0800) Pulse Rate:  [64-103] 77 (06/22 0700) Cardiac Rhythm:  [-]  Resp:  [14-23] 17 (06/22 0700) BP: (103-167)/(62-148) 144/130 mmHg (06/22 0700) SpO2:  [87 %-100 %] 100 % (06/22 0700) Weight:  [95.2 kg (209 lb 14.1 oz)-101.7 kg (224 lb 3.3 oz)] 101.7 kg (224 lb 3.3 oz) (06/22 0040)  CBC:   Recent Labs Lab 03/07/16 2249 03/07/16 2254  WBC 10.2  --   NEUTROABS 3.6  --   HGB 12.3 14.6  HCT 39.4 43.0  MCV 87.2  --   PLT 180  --     Basic Metabolic Panel:   Recent Labs Lab  03/07/16 2249 03/07/16 2254  NA 141 144  K 4.2 4.2  CL 110 109  CO2 24  --   GLUCOSE 134* 131*  BUN 16 19  CREATININE 0.78 0.70  CALCIUM 9.2  --     Lipid Panel:     Component Value Date/Time   CHOL 243* 03/08/2016 0441   TRIG 101 03/08/2016 0441   HDL 59 03/08/2016 0441   CHOLHDL 4.1 03/08/2016 0441   VLDL 20 03/08/2016 0441   LDLCALC 164* 03/08/2016 0441   HgbA1c: No results found for: HGBA1C Urine Drug Screen: No results found for: LABOPIA, COCAINSCRNUR, LABBENZ, AMPHETMU, THCU, LABBARB    IMAGING  CTA NECK  03/08/2016  1. No high-grade or critical stenosis within the major arterial vasculature of the neck. 2. Mild atheromatous plaque about the right bifurcation without significant stenosis. 3. Question soft tissue density within the in pulmonary arteries bilaterally, which may reflect pulmonary emboli, not well evaluated on this exam. Correlation with symptomatology recommended. Additionally, this could be further assessed with dedicated PE protocol CT.   CTA HEAD  03/08/2016  1. Acute proximal right M2 branch occlusion, anterior division. 2. No other large or proximal arterial branch occlusion within the intracranial circulation. 3. Atheromatous plaque throughout the cavernous ICAs with mild multi focal narrowing. No high-grade or correctable stenosis.   Ct Head Code Stroke W/o Cm 03/07/2016  1. Negative for bleed or other acute intracranial process. 2. Atrophy with chronic left cerebellar  encephalomalacia.   LE venous doppler - acute deep vein thrombosis involving the right femoral vein, right popliteal vein, right peroneal vein, and left common femoral vein and Mid femoral vein. 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 Other finding in the left lower extremity at the proximal femoral section-cyst measures 3.5 cm x. 3.4 cm.  Ct Angio Chest Pe W Or Wo Contrast 03/08/2016  IMPRESSION: Bilateral pulmonary artery. These appeared  adherent to the main pulmonary artery and may be subacute pulmonary emboli. There is pulmonary artery hypertension. Cardiac enlargement. 15 x 30 mm subpleural density left lower lobe may represent atelectasis or mass lesion. Follow-up chest CT recommended. Initial follow-up by chest CT without contrast is recommended in 3 months to confirm persistence.   TTE - pending  TCD bubble study - pending   PHYSICAL EXAM Physical exam  Temp:  [97.3 F (36.3 C)-98 F (36.7 C)] 98 F (36.7 C) (06/22 1557) Pulse Rate:  [64-103] 94 (06/22 1500) Resp:  [14-32] 32 (06/22 1500) BP: (103-167)/(55-148) 133/86 mmHg (06/22 1500) SpO2:  [87 %-100 %] 100 % (06/22 1500) Weight:  [209 lb 14.1 oz (95.2 kg)-224 lb 3.3 oz (101.7 kg)] 224 lb 3.3 oz (101.7 kg) (06/22 0040)  General - Well nourished, well developed, in no apparent distress.  Ophthalmologic - Fundi not visualized due to noncooperation.  Cardiovascular - Regular rate and rhythm.  Mental Status -  Level of arousal and orientation to time, place, and person were intact. Language including expression, naming, repetition, comprehension was assessed and found intact, however, moderate to severe dysarthria.  Cranial Nerves II - XII - II - Visual field intact OU. III, IV, VI - Extraocular movements intact. V - Facial sensation intact bilaterally. VII - left facial droop. VIII - Hearing & vestibular intact bilaterally. X - Palate elevates symmetrically, moderate to severe dysarthria. XI - Chin turning & shoulder shrug intact bilaterally. XII - Tongue protrusion to the left.  Motor Strength - The patient's strength was left UE 3/5 with decreased dexterity at left hand. RUE limited shoulder ROM due to arthritis, distal 5/5. BLE 3-/5 proximal and 5/5 distally.  Bulk was normal and fasciculations were absent.   Motor Tone - Muscle tone was assessed at the neck and appendages and was normal.  Reflexes - The patient's reflexes were 1+ in all extremities  and she had no pathological reflexes.  Sensory - Light touch, temperature/pinprick were assessed and were symmetrical.    Coordination - The patient had normal movements in the right hand with no ataxia or dysmetria.  Tremor was absent.  Gait and Station - not tested.   ASSESSMENT/PLAN Ms. Darilyn Nolte is a 82 y.o. female with history of hypertension, breast cancer obesity and recent exfoliative dermatitis presenting with garbled speech and left-sided weakness. She received IV t-PA 03/07/2016 at 2313.   Stroke:  Non-dominant right brain infarct embolic secondary to unknown source. Suspicious for DVT in the setting of PFO. Felt endocarditis d/t recent MSSA bacteremia less likely but still in DDx.  Resultant  Dysarthria, L facial, L arm weakness 3/5  CTA head proximal R M2 branch occlusion.   CTA neck no emergenct stenosis. ? Pulmonary emboli  CTA chest PE protocol - b/l pulmonary artery thrombosis ? subacute  LE venous doppler - b/l acute DVT and left chronic DVT  MRI  pending   2D Echo  pending   LDL 164  HgbA1c pending  SCDs for VTE prophylaxis Diet NPO time specified  aspirin  81 mg daily prior to admission, now on No antithrombotic as within 24h of tPA administration. Anticipate heparin drip after 24h imaging neg for hemorrhage or large infarct  Ongoing aggressive stroke risk factor management  Therapy recommendations:  pending   Disposition:  pending   DVT and Pulmonary Emboli  suspicion for PE on CTA neck  Check CTA chest - bilateral pulmonary artery thrombosis, ? Subacute  LE venous doppler - b/l acute DVT and left chronic DVT  Need anticoagulation 24 hours after tPA if MRI no hemorrhage or large infarct  Consider IVC filter if not anticoagulation candidate  Recent bacteremia  01/30/16 admission for MSSA bacteremia  Treated with Abx for 4 weeks  TEE deferred due to generalized weakness and poor candidate  EF 45%  Afebrile this admission   Blood  culture pending  Hypertension  Stable  Long-term BP goal normotensive  Hyperlipidemia  Home meds:  No statin  LDL 164, goal < 70  Add statin when have po access  Other Stroke Risk Factors  Advanced age  Obesity, Body mass index is 39.73 kg/(m^2)., recommend weight loss, diet and exercise as appropriate   Other Active Problems  GERD  Hx breast cancer  Recent exfoliative dermatitis  Hospital day # [facility] See Amion for Pager information 03/08/2016 9:37 AM   This patient is critically ill due to CVA s/p tPA, PE and DVT, recent bacteremia and at significant risk of neurological worsening, death form recurrent stroke, hemorrhagic transformation, endocarditis, heart failure, and seizure. This patient's care requires constant monitoring of vital signs, hemodynamics, respiratory and cardiac monitoring, review of multiple databases, neurological assessment, discussion with family, other specialists and medical decision making of high complexity. I spent 50 minutes of neurocritical care time in the care of this patient.  Pt admitted for stroke s/p tPA, MRI pending but CTA showed right M2 occlusion and PE which was confirmed by CTA chest. DVT also positive. Will initiate heparin drip once MRI or CT at 24 hour s/p tPA no hemorrhage or large infarct. Consider IVC filter if not anticoagulation candidate. Pending blood culture given recent bacteremia without TEE test. Continue ICU care.   Rosalin Hawking, MD PhD Stroke Neurology 03/08/2016 5:49 PM     To contact Stroke Continuity provider, please refer to [email or web address] After hours, contact General Neurology

## 2016-03-08 NOTE — Care Management Note (Signed)
Case Management Note  Patient Details  Name: Sharon Velasquez MRN: [id number] Date of Birth: [birth year]  Subjective/Objective:  Pt admitted on 03/07/16 s/p stroke with TPA given.  PTA, pt resided at [facility] and [facility].                    Action/Plan: CSW consulted to facilitate possible return to SNF upon discharge.  Will follow for discharge planning as pt progresses.   Expected Discharge Date:                  Expected Discharge Plan:  Skilled Nursing Facility  In-House Referral:  Clinical Social Work  Discharge planning Services  CM Consult  Post Acute Care Choice:    Choice offered to:     DME Arranged:    DME Agency:     HH Arranged:    Progress Agency:     Status of Service:  In process, will continue to follow  If discussed at Long Length of Stay Meetings, dates discussed:    Additional Comments:  Reinaldo Raddle, RN, BSN  Trauma/Neuro ICU Case Manager [phone]

## 2016-03-08 NOTE — Progress Notes (Signed)
Dr. Nicole Kindred called to discussed MRI results and verify order to start heparin. Order verified. Questioned whether pt could be transferred off floor after MRI results. Ordered to transfer pt. Will continue to monitor closely.

## 2016-03-09 ENCOUNTER — Inpatient Hospital Stay (HOSPITAL_COMMUNITY): Payer: Medicare Other

## 2016-03-09 ENCOUNTER — Other Ambulatory Visit (HOSPITAL_COMMUNITY): Payer: Medicare Other

## 2016-03-09 DIAGNOSIS — Z8619 Personal history of other infectious and parasitic diseases: Secondary | ICD-10-CM

## 2016-03-09 DIAGNOSIS — I6789 Other cerebrovascular disease: Secondary | ICD-10-CM

## 2016-03-09 DIAGNOSIS — E785 Hyperlipidemia, unspecified: Secondary | ICD-10-CM

## 2016-03-09 LAB — CBC
HEMATOCRIT: 35.2 % — AB (ref 36.0–46.0)
HEMATOCRIT: 36.1 % (ref 36.0–46.0)
HEMOGLOBIN: 10.6 g/dL — AB (ref 12.0–15.0)
HEMOGLOBIN: 11 g/dL — AB (ref 12.0–15.0)
MCH: 26.2 pg (ref 26.0–34.0)
MCH: 26.5 pg (ref 26.0–34.0)
MCHC: 30.1 g/dL (ref 30.0–36.0)
MCHC: 30.5 g/dL (ref 30.0–36.0)
MCV: 86.9 fL (ref 78.0–100.0)
MCV: 87 fL (ref 78.0–100.0)
Platelets: 170 10*3/uL (ref 150–400)
Platelets: 190 10*3/uL (ref 150–400)
RBC: 4.05 MIL/uL (ref 3.87–5.11)
RBC: 4.15 MIL/uL (ref 3.87–5.11)
RDW: 18.5 % — ABNORMAL HIGH (ref 11.5–15.5)
RDW: 18.6 % — ABNORMAL HIGH (ref 11.5–15.5)
WBC: 13.9 10*3/uL — ABNORMAL HIGH (ref 4.0–10.5)
WBC: 12.4 10*3/uL — ABNORMAL HIGH (ref 4.0–10.5)

## 2016-03-09 LAB — HEMOGLOBIN A1C
Hgb A1c MFr Bld: 5.3 % (ref 4.8–5.6)
Mean Plasma Glucose: 105 mg/dL

## 2016-03-09 LAB — HEPARIN LEVEL (UNFRACTIONATED)
Heparin Unfractionated: 0.65 IU/mL (ref 0.30–0.70)
Heparin Unfractionated: 0.43 IU/mL (ref 0.30–0.70)

## 2016-03-09 MED ORDER — FAMOTIDINE 20 MG PO TABS
20.0000 mg | ORAL_TABLET | Freq: Two times a day (BID) | ORAL | Status: DC
  Administered 2016-03-09 – 2016-03-13 (×8): 20 mg via ORAL
  Filled 2016-03-09 (×8): qty 1

## 2016-03-09 MED ORDER — SODIUM CHLORIDE 0.9% FLUSH
10.0000 mL | Freq: Two times a day (BID) | INTRAVENOUS | Status: DC
  Administered 2016-03-12: 10 mL

## 2016-03-09 MED ORDER — PRAVASTATIN SODIUM 20 MG PO TABS
20.0000 mg | ORAL_TABLET | Freq: Every day | ORAL | Status: DC
  Administered 2016-03-10 – 2016-03-12 (×3): 20 mg via ORAL
  Filled 2016-03-09 (×3): qty 1

## 2016-03-09 MED ORDER — SODIUM CHLORIDE 0.9% FLUSH
10.0000 mL | INTRAVENOUS | Status: DC | PRN
  Administered 2016-03-11 – 2016-03-12 (×2): 20 mL
  Filled 2016-03-09 (×2): qty 40

## 2016-03-09 MED ORDER — RESOURCE THICKENUP CLEAR PO POWD
ORAL | Status: DC | PRN
  Filled 2016-03-09: qty 125

## 2016-03-09 NOTE — Care Management Important Message (Signed)
Important Message  Patient Details  Name: Sharon Velasquez MRN: [id number] Date of Birth: [birth year]   Medicare Important Message Given:  Yes    Loann Quill 03/09/2016, 9:50 AM

## 2016-03-09 NOTE — Progress Notes (Signed)
Dear Doctor: Erlinda Hong This patient has been identified as a candidate for PICC for the following reason (s): IV therapy over 48 hours, restarts due to phlebitis and infiltration in 24 hours and incompatible drugs (aminophyllin, TPN, heparin, given with an antibiotic) If you agree, please write an order for the indicated device. For any questions contact the Vascular Access Team at [phone] if no answer, please leave a message.  Thank you for supporting the early vascular access assessment program.

## 2016-03-09 NOTE — Progress Notes (Signed)
MBSS complete. Full report located under chart review in imaging section. Deval Mroczka, MA CCC-SLP [phone]  

## 2016-03-09 NOTE — Evaluation (Signed)
Physical Therapy Evaluation Patient Details Name: Sharon Velasquez MRN: [id number] DOB: 1931/08/25 Today's Date: 03/09/2016   History of Present Illness  Patient is a 82 y.o.femalewith hx of breast ca, dementia, exfoliative dermatitis presents with acute onset of slurred speech and left sided weakness s/p tPA. Found to have acute BLEs DVTs. MRI-Acute right MCA infarct.  Clinical Impression  Patient presents with generalized weakness, deconditioning, dysarthria, and impaired balance/mobility s/p CVA. Pt non ambulatory at baseline and using hoyer lift to get to w/c at SNF. Pt is total care for all mobility/ADLs but reports working with PT at [facility]. Requires assist of 2 for bed mobility. Will follow acutely to maximize mobility and ease burden of care prior to return to SNF.     Follow Up Recommendations SNF    Equipment Recommendations  None recommended by PT    Recommendations for Other Services       Precautions / Restrictions Precautions Precautions: Fall Restrictions Weight Bearing Restrictions: No      Mobility  Bed Mobility Overal bed mobility: Needs Assistance Bed Mobility: Rolling;Sit to Supine;Supine to Sit Rolling: Total assist;+2 for physical assistance   Supine to sit: Total assist;+2 for physical assistance Sit to supine: Total assist;+2 for physical assistance   General bed mobility comments: Pt able to initiate movement of BLEs to EOB but requires assist with trunk, bottom and scooting to EOB. Posterior bias. Requires assist with BLEs and trunk to return to supine. Rolling to right/left for pericare.  Transfers Overall transfer level: Needs assistance Equipment used: 2 person hand held assist Transfers: Sit to/from Stand Sit to Stand: Total assist;+2 physical assistance         General transfer comment: Attempted to stand but pt unable to clear bottom with assist of 2. Reports using hoyer PTA.  Ambulation/Gait                Stairs            Wheelchair Mobility    Modified Rankin (Stroke Patients Only) Modified Rankin (Stroke Patients Only) Pre-Morbid Rankin Score: Severe disability Modified Rankin: Severe disability     Balance Overall balance assessment: Needs assistance Sitting-balance support: Feet supported;Bilateral upper extremity supported Sitting balance-Leahy Scale: Poor Sitting balance - Comments: Posterior lean sitting EOB with BUE support. Difficulty with anterior weight shift.   Standing balance support: During functional activity Standing balance-Leahy Scale: Zero Standing balance comment: Unable to stand with total A of 2 due to weakness.                             Pertinent Vitals/Pain Pain Assessment: No/denies pain    Home Living Family/patient expects to be discharged to:: Skilled nursing facility                 Additional Comments: was at [facility] since earlier in May per pt report; states has used hoyer lift and has not been up on her feet in about 1 year.  Reports she was able to feed herself prior to admission, remained incontinent and getting a bed bath    Prior Function Level of Independence: Needs assistance   Gait / Transfers Assistance Needed: transfers via hoyer  ADL's / Homemaking Assistance Needed: was able to self feed. needed total assist with ADLs        Hand Dominance        Extremity/Trunk Assessment   Upper Extremity Assessment: Defer to OT evaluation (  Limited mobility LUE. Weak grip bilaterally.)           Lower Extremity Assessment: Generalized weakness (Reports sensation WFL. ABle to perform LAQ bilaterally but weakness throughout. )         Communication   Communication: Expressive difficulties (dysarthria)  Cognition Arousal/Alertness: Awake/alert Behavior During Therapy: WFL for tasks assessed/performed Overall Cognitive Status: Within Functional Limits for tasks assessed                      General  Comments      Exercises        Assessment/Plan    PT Assessment Patient needs continued PT services  PT Diagnosis Generalized weakness   PT Problem List Decreased strength;Decreased range of motion;Decreased activity tolerance;Decreased balance;Decreased mobility  PT Treatment Interventions Functional mobility training;Therapeutic activities;Therapeutic exercise;Wheelchair mobility training;Patient/family education;Cognitive remediation;Neuromuscular re-education;Balance training   PT Goals (Current goals can be found in the Care Plan section) Acute Rehab PT Goals Patient Stated Goal: none stated PT Goal Formulation: With patient Time For Goal Achievement: 03/23/16 Potential to Achieve Goals: Fair    Frequency Min 2X/week   Barriers to discharge        Co-evaluation PT/OT/SLP Co-Evaluation/Treatment: Yes Reason for Co-Treatment: Complexity of the patient's impairments (multi-system involvement);For patient/therapist safety (Simultaneous filing. User may not have seen previous data.) PT goals addressed during session: Mobility/safety with mobility;Strengthening/ROM OT goals addressed during session:  (mobility)       End of Session Equipment Utilized During Treatment: Gait belt Activity Tolerance: Patient tolerated treatment well Patient left: in bed;with call bell/phone within reach;with bed alarm set;with nursing/sitter in room Nurse Communication: Mobility status;Need for lift equipment         Time: [id number] PT Time Calculation (min) (ACUTE ONLY): 29 min   Charges:   PT Evaluation $PT Eval Moderate Complexity: 1 Procedure     PT G Codes:        Sharon Velasquez 03/09/2016, 2:53 PM  Wray Kearns, [place], DPT [phone]

## 2016-03-09 NOTE — Progress Notes (Signed)
STROKE TEAM PROGRESS NOTE   SUBJECTIVE (INTERVAL HISTORY) No family at bedside. Patient sitting up in bed, talkative (dysarthric). Complains her nectar thick liquid diet is "nasty". No new complaints. Doing well on heparin drip. Has bilateral arm bruise, difficult IV access. Will need PICC line. Her Hb down from 12.3 to 10.6, will need close monitoring. No obvious bleeding.    OBJECTIVE Temp:  [97.4 F (36.3 C)-98 F (36.7 C)] 97.5 F (36.4 C) (06/23 0502) Pulse Rate:  [61-94] 75 (06/23 0502) Cardiac Rhythm:  [-] Normal sinus rhythm (06/23 0700) Resp:  [12-32] 16 (06/23 0502) BP: (79-140)/(37-86) 117/64 mmHg (06/23 0502) SpO2:  [92 %-100 %] 99 % (06/23 0502)  CBC:   Recent Labs Lab 03/07/16 2249 03/07/16 2254 03/09/16 0650  WBC 10.2  --  13.9*  NEUTROABS 3.6  --   --   HGB 12.3 14.6 10.6*  HCT 39.4 43.0 35.2*  MCV 87.2  --  86.9  PLT 180  --  [id number]    Basic Metabolic Panel:   Recent Labs Lab 03/07/16 2249 03/07/16 2254  NA 141 144  K 4.2 4.2  CL 110 109  CO2 24  --   GLUCOSE 134* 131*  BUN 16 19  CREATININE 0.78 0.70  CALCIUM 9.2  --     Lipid Panel:     Component Value Date/Time   CHOL 243* 03/08/2016 0441   TRIG 101 03/08/2016 0441   HDL 59 03/08/2016 0441   CHOLHDL 4.1 03/08/2016 0441   VLDL 20 03/08/2016 0441   LDLCALC 164* 03/08/2016 0441   HgbA1c:  Lab Results  Component Value Date   HGBA1C 5.3 03/08/2016    IMAGING I have personally reviewed the radiological images below and agree with the radiology interpretations.  CTA NECK  03/08/2016  1. No high-grade or critical stenosis within the major arterial vasculature of the neck. 2. Mild atheromatous plaque about the right bifurcation without significant stenosis. 3. Question soft tissue density within the in pulmonary arteries bilaterally, which may reflect pulmonary emboli, not well evaluated on this exam. Correlation with symptomatology recommended. Additionally, this could be further assessed  with dedicated PE protocol CT.   CTA HEAD  03/08/2016  1. Acute proximal right M2 branch occlusion, anterior division. 2. No other large or proximal arterial branch occlusion within the intracranial circulation. 3. Atheromatous plaque throughout the cavernous ICAs with mild multi focal narrowing. No high-grade or correctable stenosis.   Ct Head Code Stroke W/o Cm 03/07/2016  1. Negative for bleed or other acute intracranial process. 2. Atrophy with chronic left cerebellar encephalomalacia.   MRI BRAIN 03/07/2016 1. Acute R MCA infarct, mild to moderate in size with gyral involvement centered at the right insula and operculum. 2. Positive Petechial hemorrhage, but no mass effect or malignant hemorrhagic transformation. 3. No other acute intracranial abnormality.  LE venous doppler - acute deep vein thrombosis involving the right femoral vein, right popliteal vein, right peroneal vein, and left common femoral vein and Mid femoral vein. 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 Other finding in the left lower extremity at the proximal femoral section-cyst measures 3.5 cm x. 3.4 cm.  Ct Angio Chest Pe W Or Wo Contrast 03/08/2016  IMPRESSION: Bilateral pulmonary artery. These appeared adherent to the main pulmonary artery and may be subacute pulmonary emboli. There is pulmonary artery hypertension. Cardiac enlargement. 15 x 30 mm subpleural density left lower lobe may represent atelectasis or mass lesion. Follow-up chest CT recommended. Initial follow-up  by chest CT without contrast is recommended in 3 months to confirm persistence.   TTE - pending   TCD bubble study - pending (not able to be done today due to IV access, will need to be done next Monday with Dr. Leonie Man)   PHYSICAL EXAM General - Well nourished, well developed, in no apparent distress.  Ophthalmologic - Fundi not visualized due to noncooperation.  Cardiovascular - Regular rate and  rhythm.  Mental Status -  Level of arousal and orientation to time, place, and person were intact. Language including expression, naming, repetition, comprehension was assessed and found intact, however, moderate dysarthria.  Cranial Nerves II - XII - II - Visual field intact OU. III, IV, VI - Extraocular movements intact. V - Facial sensation intact bilaterally. VII - left facial droop. VIII - Hearing & vestibular intact bilaterally. X - Palate elevates symmetrically, moderate dysarthria. XI - Chin turning & shoulder shrug intact bilaterally. XII - Tongue protrusion to the left.  Motor Strength - The patient's strength was BUE limited shoulder ROM due to arthritis. 3+/5 bilateral tricep and bicep, Left hand 3/5 finger grip and decreased dexterity, right hand grip 5/5. BLE 3-/5 proximal and 5/5 distally.  Bulk was normal and fasciculations were absent.   Motor Tone - Muscle tone was assessed at the neck and appendages and was normal.  Reflexes - The patient's reflexes were 1+ in all extremities and she had no pathological reflexes.  Sensory - Light touch, temperature/pinprick were assessed and were symmetrical.    Coordination - The patient had normal movements in the right hand with no ataxia or dysmetria.  Tremor was absent.  Gait and Station - not tested.   ASSESSMENT/PLAN [name] is a 82 y.o. female with history of hypertension, breast cancer obesity and recent exfoliative dermatitis presenting with garbled speech and left-sided weakness. She received IV t-PA 03/07/2016 at 2313. Pt has been bed bound for the last 2 years.  Stroke:  Non-dominant right MCA infarct, embolic secondary to unknown source. Suspicious for DVT in the setting of PFO. Felt endocarditis d/t recent MSSA bacteremia less likely but still in DDx.  Resultant  Dysarthria, L facial, L hand weakness 3/5  CTA head proximal R M2 branch occlusion.   CTA neck no emergenct stenosis. ? Pulmonary  emboli  CTA chest PE protocol - b/l pulmonary artery thrombosis ? subacute  LE venous doppler - b/l acute DVT and left chronic DVT  MRI  R MCA infarct with petechial hemorrhage.  2D Echo  pending   TCD bubble study not able to complete today due to IV access issue. Will need to be done next Monday with Dr. Leonie Man  LDL 164  HgbA1c 5.3  SCDs for VTE prophylaxis DIET - DYS 1 Room service appropriate?: Yes; Fluid consistency:: Nectar Thick  aspirin 81 mg daily prior to admission, now on heparin IV given DVT and PE. Will transition to po NOAC next week if neuro stable.  Ongoing aggressive stroke risk factor management  Therapy recommendations:  pending   Disposition:  pending   DVT and Pulmonary Emboli  suspicion for PE on CTA neck  Check CTA chest - bilateral pulmonary artery thrombosis, ? Subacute  LE venous doppler - b/l acute DVT and left chronic DVT  Started on IV heparin 24 hours after tPA  Will transition to po NOAC next week if neuro stable.  Recent bacteremia  01/30/16 admission for MSSA bacteremia  Treated with Abx for 4 weeks  TEE deferred  due to generalized weakness and poor candidate  EF 45% at that time  Afebrile this admission   Blood culture pending  Anemia  Hb 12.3 -> 10.6  No obvious bleeding source  Close monitoring  Hypertension  Stable  Long-term BP goal normotensive  Hyperlipidemia  Home meds:  No statin  LDL 164, goal < 70  Add pravastatin 20mg   Continue on discharge  Other Stroke Risk Factors  Advanced age  Obesity, Body mass index is 39.73 kg/(m^2)., recommend weight loss, diet and exercise as appropriate   Other Active Problems  GERD  Hx breast cancer  Recent exfoliative dermatitis  Anemia. ? Dilution. Check stool guiac. CBC this afternoon at [facility] day # 2  Rosalin Hawking, MD PhD Stroke Neurology 03/09/2016 5:28 PM     To contact Stroke Continuity provider, please refer to [email or web address] After  hours, contact General Neurology

## 2016-03-09 NOTE — Progress Notes (Signed)
ANTICOAGULATION CONSULT NOTE - Initial Consult  Pharmacy Consult for Heparin Indication: pulmonary embolus, DVT and stroke  Allergies  Allergen Reactions  . Latex   . 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 [facility]    Patient Measurements: Height: 5\' 3"  (160 cm) Weight: 224 lb 3.3 oz (101.7 kg) IBW/kg (Calculated) : 52.4 Heparin Dosing Weight: 77.5 kg  Vital Signs: Temp: 97.5 F (36.4 C) (06/23 0502) Temp Source: Oral (06/23 0502) BP: 117/64 mmHg (06/23 0502) Pulse Rate: 75 (06/23 0502)  Labs:  Recent Labs  03/07/16 2249 03/07/16 2254 03/09/16 0645 03/09/16 0650  HGB 12.3 14.6  --  10.6*  HCT 39.4 43.0  --  35.2*  PLT 180  --   --  170  APTT 25  --   --   --   LABPROT 14.2  --   --   --   INR 1.08  --   --   --   HEPARINUNFRC  --   --  0.65  --   CREATININE 0.78 0.70  --   --     Estimated Creatinine Clearance: 60.6 mL/min (by C-G formula based on Cr of 0.7).   Medical History: Past Medical History  Diagnosis Date  . Hypertension   . GERD (gastroesophageal reflux disease)   . Cancer [facility])     breast  . Seasonal allergies    Assessment: 82 yr old female admitted 03/07/16 pm with ischemic stroke and received tPA at 23:13 on 03/07/16.  Stroke workup has revealed bilateral subacute PE on chest CT along with acute right leg DVT and chronic left leg DVT per venous dopplers.   Repeat MRI on 03/08/16 pm noted punctate hemorrhagic transformation, but benefits of anticoagulation outweigh risks per Neuro assessment. For stat CT head without contrast if any neuro changes.  Heparin was started at a rate of 900 units/hr with NO bolus and a goal heparin level of 0.3-0.5. This morning her heparin level is above goal at 0.65.  Nurse reports that her left arm appears to be bigger than her right arm in the [facility] area, but it is hard to tell due to body habitus. For this reason, overnight her heparin line was changed to her hand. The IV team will be contacted to attempt  to change the line to her right arm until the physician can formally evaluate it this morning.  Hgb has dropped from 14.6 on 6/21 to 10.6 this morning, platelets remain WNL. Nurse reports no visible bleeding, but does state her left arm is bruised. Will re-check CBC and order FOBT per Dr. Erlinda Hong  Goal of Therapy:  Heparin level 0.3-0.5 units/ml Monitor platelets by anticoagulation protocol: Yes   Plan:  --Reduce heparin rate to 750 units/hr --Follow-up heparin level and CBC in 8 hrs --Daily heparin level and CBC  --FOBT per Dr. Erlinda Hong --Monitor closely for any bleeding  Governor Specking, PharmD Clinical Pharmacy Resident Pager: [phone]  03/09/2016,8:49 AM

## 2016-03-09 NOTE — Progress Notes (Signed)
Pt arrived to floor from 30M. Pt alert and oriented x 4. Oriented to room and call light. Vitals taken and are stable. Pt resting comfortably.

## 2016-03-09 NOTE — Progress Notes (Signed)
OT Cancellation Note  Patient Details Name: Sharon Velasquez MRN: [id number] DOB: 1931/08/05   Cancelled Treatment:    Reason Eval/Treat Not Completed: Medical issues which prohibited therapy. Pt with acute bil LE DVTs; Heparin started last night at 11pm. Will await lab results for initiation of OT eval. Pt currently on strict bedrest; please update activity orders as appropriate.  Binnie Kand M.S., OTR/L Pager: [phone]  03/09/2016, 8:53 AM

## 2016-03-09 NOTE — Evaluation (Signed)
Occupational Therapy Evaluation Patient Details Name: Sharon Velasquez MRN: [id number] DOB: [birth year] Today's Date: 03/09/2016    History of Present Illness Patient is a 82 y.o.femalewith hx of breast ca, dementia, exfoliative dermatitis presents with acute onset of slurred speech and left sided weakness s/p tPA. Found to have acute BLEs DVTs. MRI-Acute right MCA infarct.   Clinical Impression   Pt reports she required assist for all ADLs and staff used a hoyer lift to get pt OOB at SNF PTA. Currently pt is total assist +2 overall for bed mobility and ADLs at bed level. Pt presenting with bil UE weakness, decreased ROM, and impaired coordination impacting her independence and safety with ADLs. Plan is for pt to return to SNF upon d/c; agree with SNF placement. Pt would benefit from continued skilled OT to address established goals.    Follow Up Recommendations  SNF;Supervision/Assistance - 24 hour    Equipment Recommendations  Other (comment) (TBD at next venue)    Recommendations for Other Services       Precautions / Restrictions Precautions Precautions: Fall Restrictions Weight Bearing Restrictions: No      Mobility Bed Mobility Overal bed mobility: Needs Assistance Bed Mobility: Rolling;Sit to Supine;Supine to Sit Rolling: Total assist;+2 for physical assistance   Supine to sit: Total assist;+2 for physical assistance Sit to supine: Total assist;+2 for physical assistance   General bed mobility comments: Pt able to initiate movement of BLEs to EOB but requires assist with trunk, bottom and scooting to EOB. Posterior bias. Requires assist with BLEs and trunk to return to supine. Rolling to right/left for pericare.  Transfers Overall transfer level: Needs assistance Equipment used: 2 person hand held assist Transfers: Sit to/from Stand Sit to Stand: Total assist;+2 physical assistance         General transfer comment: Attempted to stand but pt unable to clear  bottom with assist of 2. Reports using hoyer PTA.    Balance Overall balance assessment: Needs assistance Sitting-balance support: Feet supported;Bilateral upper extremity supported Sitting balance-Leahy Scale: Poor Sitting balance - Comments: Posterior lean sitting EOB with BUE support. Difficulty with anterior weight shift.   Standing balance support: During functional activity Standing balance-Leahy Scale: Zero Standing balance comment: Unable to acheive full stand with total assist +2 due to weakness.                            ADL Overall ADL's : Needs assistance/impaired Eating/Feeding: NPO                                     General ADL Comments: Attempted to perform sit to stand from EOB with total assist +2; unable to get pt to standing. Pt currently requires total assist +2 for ADLs at bed level. Pt incontinent of bowel and bladder during session. Pt able to roll in bed with assist so therapist could perform peri care.       Vision Additional Comments: Vision to be further assessed   Perception     Praxis      Pertinent Vitals/Pain Pain Assessment: No/denies pain     Hand Dominance     Extremity/Trunk Assessment Upper Extremity Assessment Upper Extremity Assessment: Generalized weakness (Limited shoulder ROM bilaterally)   Lower Extremity Assessment Lower Extremity Assessment: Defer to PT evaluation       Communication Communication Communication: Expressive difficulties (dysarthria)  Cognition Arousal/Alertness: Awake/alert Behavior During Therapy: WFL for tasks assessed/performed Overall Cognitive Status: Within Functional Limits for tasks assessed                     General Comments       Exercises       Shoulder Instructions      Home Living Family/patient expects to be discharged to:: Skilled nursing facility                                 Additional Comments: was at [facility] since  earlier in May per pt report; states has used hoyer lift and has not been up on her feet in about 1 year.  Reports she was able to feed herself prior to admission, remained incontinent and getting a bed bath      Prior Functioning/Environment Level of Independence: Needs assistance  Gait / Transfers Assistance Needed: transfers via hoyer ADL's / Homemaking Assistance Needed: was able to self feed. needed total assist with ADLs        OT Diagnosis: Generalized weakness;Hemiplegia non-dominant side   OT Problem List: Decreased strength;Decreased range of motion;Decreased activity tolerance;Impaired balance (sitting and/or standing);Decreased coordination;Decreased safety awareness;Decreased knowledge of use of DME or AE;Decreased knowledge of precautions;Impaired tone;Obesity;Impaired UE functional use   OT Treatment/Interventions: Self-care/ADL training;Therapeutic exercise;Energy conservation;DME and/or AE instruction;Therapeutic activities;Patient/family education;Balance training    OT Goals(Current goals can be found in the care plan section) Acute Rehab OT Goals Patient Stated Goal: none stated OT Goal Formulation: With patient Time For Goal Achievement: 03/23/16 Potential to Achieve Goals: Fair ADL Goals Pt Will Perform Grooming: with set-up;sitting Pt Will Perform Upper Body Bathing: with min assist;sitting Pt Will Perform Upper Body Dressing: with min assist;sitting Pt/caregiver will Perform Home Exercise Program: Increased ROM;Increased strength;Both right and left upper extremity;With minimal assist;With written HEP provided Additional ADL Goal #1: Pt will sit EOB with min guard assist for 5 minutes as precursor for ADLs.  OT Frequency: Min 2X/week   Barriers to D/C:            Co-evaluation PT/OT/SLP Co-Evaluation/Treatment: Yes Reason for Co-Treatment: Complexity of the patient's impairments (multi-system involvement);For patient/therapist safety PT goals addressed  during session: Mobility/safety with mobility;Strengthening/ROM OT goals addressed during session: ADL's and self-care;Other (comment) (functional mobility)      End of Session Equipment Utilized During Treatment: Gait belt Nurse Communication: Mobility status;Need for lift equipment  Activity Tolerance: Patient tolerated treatment well Patient left: in bed;with call bell/phone within reach;with bed alarm set   Time: [id number] OT Time Calculation (min): 27 min Charges:  OT General Charges $OT Visit: 1 Procedure OT Evaluation $OT Eval High Complexity: 1 Procedure G-Codes:     Binnie Kand M.S., OTR/L Pager: [phone]  03/09/2016, 4:31 PM

## 2016-03-09 NOTE — Progress Notes (Signed)
ANTICOAGULATION CONSULT NOTE - Initial Consult  Pharmacy Consult for Heparin Indication: pulmonary embolus, DVT and stroke  Allergies  Allergen Reactions  . Latex   . 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 [facility]    Patient Measurements: Height: 5\' 3"  (160 cm) Weight: 224 lb 3.3 oz (101.7 kg) IBW/kg (Calculated) : 52.4 Heparin Dosing Weight: 77.5 kg  Vital Signs: Temp: 98.7 F (37.1 C) (06/23 1809) Temp Source: Oral (06/23 1809) BP: 127/65 mmHg (06/23 1809) Pulse Rate: 80 (06/23 1809)  Labs:  Recent Labs  03/07/16 2249 03/07/16 2254 03/09/16 0645 03/09/16 0650 03/09/16 1832 03/09/16 1833  HGB 12.3 14.6  --  10.6*  --  11.0*  HCT 39.4 43.0  --  35.2*  --  36.1  PLT 180  --   --  170  --  190  APTT 25  --   --   --   --   --   LABPROT 14.2  --   --   --   --   --   INR 1.08  --   --   --   --   --   HEPARINUNFRC  --   --  0.65  --  0.43  --   CREATININE 0.78 0.70  --   --   --   --     Estimated Creatinine Clearance: 60.6 mL/min (by C-G formula based on Cr of 0.7).   Medical History: Past Medical History  Diagnosis Date  . Hypertension   . GERD (gastroesophageal reflux disease)   . Cancer [facility])     breast  . Seasonal allergies    Assessment: 82 yr old female admitted 03/07/16 pm with ischemic stroke and received tPA at 23:13 on 03/07/16.  Stroke workup has revealed bilateral subacute PE on chest CT along with acute right leg DVT and chronic left leg DVT per venous dopplers.   Repeat MRI on 03/08/16 pm noted punctate hemorrhagic transformation, but benefits of anticoagulation outweigh risks per Neuro assessment. For stat CT head without contrast if any neuro changes.  Heparin level is now at goal (0.43) on 750 units/hr.  No further drop in CBC. RN reports no bleeding or new bruising.  Goal of Therapy:  Heparin level 0.3-0.5 units/ml Monitor platelets by anticoagulation protocol: Yes   Plan:    Continue heparin drip at 750 units/hr   Next  heparin level and CBC in am.   Monitor closely for any bleeding.   F/u FOB checks.  Arty Baumgartner, [place] Pager: [phone] 03/09/2016,8:27 PM

## 2016-03-09 NOTE — Progress Notes (Signed)
Peripherally Inserted Central Catheter/Midline Placement  The IV Nurse has discussed with the patient and/or persons authorized to consent for the patient, the purpose of this procedure and the potential benefits and risks involved with this procedure.  The benefits include less needle sticks, lab draws from the catheter and patient may be discharged home with the catheter.  Risks include, but not limited to, infection, bleeding, blood clot (thrombus formation), and puncture of an artery; nerve damage and irregular heat beat.  Alternatives to this procedure were also discussed.  Consent signed by duaghter  PICC/Midline Placement Documentation  PICC Double Lumen 0000000 PICC Left Basilic 44 cm 0 cm (Active)  Indication for Insertion or Continuance of Line Poor Vasculature-patient has had multiple peripheral attempts or PIVs lasting less than 24 hours 03/09/2016  5:39 PM  Exposed Catheter (cm) 0 cm 03/09/2016  5:39 PM  Site Assessment Clean;Dry;Intact 03/09/2016  5:39 PM  Lumen #1 Status Flushed;Saline locked;Blood return noted 03/09/2016  5:39 PM  Lumen #2 Status Flushed;Saline locked;Blood return noted 03/09/2016  5:39 PM  Dressing Type Transparent 03/09/2016  5:39 PM  Dressing Status Clean;Dry;Intact 03/09/2016  5:39 PM  Dressing Change Due 03/16/16 03/09/2016  5:39 PM       Gordan Payment 03/09/2016, 5:42 PM

## 2016-03-09 NOTE — Progress Notes (Signed)
  Echocardiogram 2D Echocardiogram has been performed.  Sharon Velasquez 03/09/2016, 4:23 PM

## 2016-03-09 NOTE — Progress Notes (Addendum)
Report called to 30M. All questions and concerns address. Faith, daughter, called and updated on pt's condition and change in room. All questions addressed. Pt to be transferred to 30M01.   Pt stated she had her glasses sitting on her chest wrapped in a wash cloth, and now they were gone. Informed pt that this nurse has not seen any glasses during shift and not during incontinent changes. All sheets, bed, drawers checked, and dirty linen checked. Reported to Caryl Pina, RN on 30M.

## 2016-03-09 NOTE — Progress Notes (Signed)
PT Cancellation Note  Patient Details Name: Sharon Velasquez MRN: [id number] DOB: May 11, 1931   Cancelled Treatment:    Reason Eval/Treat Not Completed: Patient not medically ready Pt with CVA s/p tPA, with acute bil LE DVTs, started on Heparin last night at 11 pm, awaiting lab results to ensure pt in therapeutic range prior to initiation of PT eval and increase in activity orders as pt on strict bedrest. Will follow.   Marguarite Arbour A [place] Belote 03/09/2016, 8:38 AM Wray Kearns, PT, DPT [phone]

## 2016-03-10 DIAGNOSIS — I63511 Cerebral infarction due to unspecified occlusion or stenosis of right middle cerebral artery: Secondary | ICD-10-CM

## 2016-03-10 LAB — ECHOCARDIOGRAM LIMITED
CHL CUP TV REG PEAK VELOCITY: 376 cm/s
HEIGHTINCHES: 63 in
LV TDI E'LATERAL: 6.74
LVELAT: 6.74 cm/s
TDI e' medial: 7.07
TRMAXVEL: 376 cm/s
Weight: 3587.33 oz

## 2016-03-10 LAB — CBC
HEMATOCRIT: 31.6 % — AB (ref 36.0–46.0)
Hemoglobin: 9.8 g/dL — ABNORMAL LOW (ref 12.0–15.0)
MCH: 27.5 pg (ref 26.0–34.0)
MCHC: 31 g/dL (ref 30.0–36.0)
MCV: 88.8 fL (ref 78.0–100.0)
PLATELETS: 153 10*3/uL (ref 150–400)
RBC: 3.56 MIL/uL — ABNORMAL LOW (ref 3.87–5.11)
RDW: 18.6 % — AB (ref 11.5–15.5)
WBC: 11.6 10*3/uL — AB (ref 4.0–10.5)

## 2016-03-10 LAB — HEPARIN LEVEL (UNFRACTIONATED): HEPARIN UNFRACTIONATED: 0.36 [IU]/mL (ref 0.30–0.70)

## 2016-03-10 MED ORDER — RESOURCE THICKENUP CLEAR PO POWD
ORAL | Status: DC | PRN
  Filled 2016-03-10: qty 125

## 2016-03-10 NOTE — Progress Notes (Signed)
STROKE TEAM PROGRESS NOTE   SUBJECTIVE (INTERVAL HISTORY) No family at bedside. Patient sitting up in bed, talkative (dysarthric). She feels "fine". No new complaints. Doing well on heparin drip. Her hgb is variable, today 9.6, need to keep a close watch for bleeding.  OBJECTIVE Temp:  [97.8 F (36.6 C)-98.7 F (37.1 C)] 98 F (36.7 C) (06/24 0925) Pulse Rate:  [73-98] 98 (06/24 0925) Cardiac Rhythm:  [-] Normal sinus rhythm (06/24 0700) Resp:  [16-20] 20 (06/24 0925) BP: (108-136)/(62-118) 108/91 mmHg (06/24 0925) SpO2:  [93 %-100 %] 95 % (06/24 0925)  CBC:   Recent Labs Lab 03/07/16 2249  03/09/16 1833 03/10/16 0433  WBC 10.2  < > 12.4* 11.6*  NEUTROABS 3.6  --   --   --   HGB 12.3  < > 11.0* 9.8*  HCT 39.4  < > 36.1 31.6*  MCV 87.2  < > 87.0 88.8  PLT 180  < > 190 153  < > = values in this interval not displayed.  Basic Metabolic Panel:   Recent Labs Lab 03/07/16 2249 03/07/16 2254  NA 141 144  K 4.2 4.2  CL 110 109  CO2 24  --   GLUCOSE 134* 131*  BUN 16 19  CREATININE 0.78 0.70  CALCIUM 9.2  --     Lipid Panel:     Component Value Date/Time   CHOL 243* 03/08/2016 0441   TRIG 101 03/08/2016 0441   HDL 59 03/08/2016 0441   CHOLHDL 4.1 03/08/2016 0441   VLDL 20 03/08/2016 0441   LDLCALC 164* 03/08/2016 0441   HgbA1c:  Lab Results  Component Value Date   HGBA1C 5.3 03/08/2016    IMAGING I have personally reviewed the radiological images below and agree with the radiology interpretations.  CTA NECK  03/08/2016  1. No high-grade or critical stenosis within the major arterial vasculature of the neck. 2. Mild atheromatous plaque about the right bifurcation without significant stenosis. 3. Question soft tissue density within the in pulmonary arteries bilaterally, which may reflect pulmonary emboli, not well evaluated on this exam. Correlation with symptomatology recommended. Additionally, this could be further assessed with dedicated PE protocol CT.    CTA HEAD  03/08/2016  1. Acute proximal right M2 branch occlusion, anterior division. 2. No other large or proximal arterial branch occlusion within the intracranial circulation. 3. Atheromatous plaque throughout the cavernous ICAs with mild multi focal narrowing. No high-grade or correctable stenosis.   Ct Head Code Stroke W/o Cm 03/07/2016  1. Negative for bleed or other acute intracranial process. 2. Atrophy with chronic left cerebellar encephalomalacia.   MRI BRAIN 03/07/2016 1. Acute R MCA infarct, mild to moderate in size with gyral involvement centered at the right insula and operculum. 2. Positive Petechial hemorrhage, but no mass effect or malignant hemorrhagic transformation. 3. No other acute intracranial abnormality.  LE venous doppler - acute deep vein thrombosis involving the right femoral vein, right popliteal vein, right peroneal vein, and left common femoral vein and Mid femoral vein. 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 Other finding in the left lower extremity at the proximal femoral section-cyst measures 3.5 cm x. 3.4 cm.  Ct Angio Chest Pe W Or Wo Contrast 03/08/2016  IMPRESSION: Bilateral pulmonary artery. These appeared adherent to the main pulmonary artery and may be subacute pulmonary emboli. There is pulmonary artery hypertension. Cardiac enlargement. 15 x 30 mm subpleural density left lower lobe may represent atelectasis or mass lesion. Follow-up chest CT  recommended. Initial follow-up by chest CT without contrast is recommended in 3 months to confirm persistence.   TTE - pending   TCD bubble study - pending (not able to be done yesterday due to IV access, will need to be done next Monday with Dr. Leonie Man)   [place]; Neuro exam is stable, no changes today General - Well nourished, well developed, in no apparent distress.  Ophthalmologic - Fundi not visualized due to noncooperation.  Cardiovascular - Regular  rate and rhythm.  Mental Status -  Level of arousal and orientation to time, place, and person were intact. Language including expression, naming, repetition, comprehension was assessed and found intact, however, moderate dysarthria.  Cranial Nerves II - XII - II - Visual field intact OU. III, IV, VI - Extraocular movements intact. V - Facial sensation intact bilaterally. VII - left facial droop. VIII - Hearing & vestibular intact bilaterally. X - Palate elevates symmetrically, moderate dysarthria. XI - Chin turning & shoulder shrug intact bilaterally. XII - Tongue protrusion to the left.  Motor Strength - The patient's strength was BUE limited shoulder ROM due to arthritis. 3+/5 bilateral tricep and bicep, Left hand 3/5 finger grip and decreased dexterity, right hand grip 5/5. BLE 3-/5 proximal and 5/5 distally.  Bulk was normal and fasciculations were absent.   Motor Tone - Muscle tone was assessed at the neck and appendages and was normal.  Reflexes - The patient's reflexes were 1+ in all extremities and she had no pathological reflexes.  Sensory - Light touch, temperature/pinprick were assessed and were symmetrical.    Coordination - The patient had normal movements in the right hand with no ataxia or dysmetria.  Tremor was absent.  Gait and Station - not tested.   ASSESSMENT/PLAN [name] is a 82 y.o. female with history of hypertension, breast cancer obesity and recent exfoliative dermatitis presenting with garbled speech and left-sided weakness. She received IV t-PA 03/07/2016 at 2313. Pt has been bed bound for the last 2 years.  Stroke:  Non-dominant right MCA infarct, embolic secondary to unknown source. Suspicious for DVT in the setting of PFO. Felt endocarditis d/t recent MSSA bacteremia less likely but still in DDx.  Resultant  Dysarthria, L facial, L hand weakness 3/5  CTA head proximal R M2 branch occlusion.   CTA neck no emergenct stenosis. ? Pulmonary  emboli  CTA chest PE protocol - b/l pulmonary artery thrombosis ? subacute  LE venous doppler - b/l acute DVT and left chronic DVT  MRI  R MCA infarct with petechial hemorrhage.  2D Echo  pending   TCD bubble study not able to complete today due to IV access issue. Will need to be done next Monday with Dr. Leonie Man  LDL 164  HgbA1c 5.3  SCDs for VTE prophylaxis DIET - DYS 1 Room service appropriate?: Yes; Fluid consistency:: Nectar Thick  aspirin 81 mg daily prior to admission, now on heparin IV given DVT and PE. Will transition to po NOAC next week if neuro stable.  Ongoing aggressive stroke risk factor management  Therapy recommendations:  pending   Disposition:  pending   DVT and Pulmonary Emboli  suspicion for PE on CTA neck  Check CTA chest - bilateral pulmonary artery thrombosis, ? Subacute  LE venous doppler - b/l acute DVT and left chronic DVT  Started on IV heparin 24 hours after tPA  Will transition to po NOAC next week if neuro stable.  Recent bacteremia  01/30/16 admission for MSSA bacteremia  Treated with Abx for 4 weeks  TEE deferred due to generalized weakness and poor candidate  EF 45% at that time  Afebrile this admission   Blood culture pending  Anemia  Hb 12.3 -> 10.6  No obvious bleeding source  Close monitoring  Hypertension  Stable  Long-term BP goal normotensive  Hyperlipidemia  Home meds:  No statin  LDL 164, goal < 70  Add pravastatin 20mg   Continue on discharge  Other Stroke Risk Factors  Advanced age  Obesity, Body mass index is 39.73 kg/(m^2)., recommend weight loss, diet and exercise as appropriate   Other Active Problems  GERD  Hx breast cancer  Recent exfoliative dermatitis  Anemia. ? Dilution. Check stool guiac. CBC this afternoon at [facility] day # 3   Personally examined patient and images, and have participated in and made any corrections needed to history, physical, neuro exam,assessment  and plan as stated above.  I have personally obtained the history, evaluated lab date, reviewed imaging studies and agree with radiology interpretations.    Sarina Ill, MD Stroke Neurology [phone] [facility]       To contact Stroke Continuity provider, please refer to [email or web address] After hours, contact General Neurology

## 2016-03-10 NOTE — Progress Notes (Signed)
Speech Language Pathology Treatment: Dysphagia  Patient Details Name: Sharon Velasquez MRN: [id number] DOB: Nov 20, 1930 Today's Date: 03/10/2016 Time: [id number] SLP Time Calculation (min) (ACUTE ONLY): 25 min  Assessment / Plan / Recommendation Clinical Impression  ST follow up for therapeutic diet tolerance and possible advancement of diet.  Chart review revealed that the patient is afebrile and intake has been good.  Her lungs sounds are questionable with reading today stating rhonchi.  Nursing reported no obvious issues with intake.  Family reported that the patient was observed to cough with intake yesterday.  The patient was positioned well in bed sitting totally upright for meal observation.  Her family had brought in her dentures so soft solids were trialed.  The patient was noted to have mild residue in her right buccal area. Given min cues the patient was able to remove the material easily.   She was also given thin liquids via straw sips using a chin tuck.  She required moderate cues to facilitate proper consistent use of the chin tuck.  Nectar thick liquids via self fed cup sips were also observed.  No obvious s/s of aspiration were seen throughout.   Recommend advancing the patient's diet to dysphagia 2 and continue with the nectar thick liquids until the patient is more independent in her use of a chin tuck.  ST will continue to follow.     HPI HPI: 82 y.o. female with multiple risk factors for stroke presenting with acute right MCA territory ischemic infarction. Pt has a history of dysphagia with throat clearing and oral residuals seen in 5/17, no objective test. Pt put on nectar thick liquids.       SLP Plan  Continue with current plan of care     Recommendations  Diet recommendations: Dysphagia 2 (fine chop);Nectar-thick liquid;Other(comment) (Pt may have thins fully supervised by staff using chin tuck.) Liquids provided via: Cup Medication Administration: Via alternative  means Supervision: Staff to assist with self feeding Compensations: Slow rate;Small sips/bites;Lingual sweep for clearance of pocketing;Monitor for anterior loss;Chin tuck;Use straw to facilitate chin tuck;Clear throat intermittently Postural Changes and/or Swallow Maneuvers: Seated upright 90 degrees;Upright 30-60 min after meal             Oral Care Recommendations: Oral care QID Follow up Recommendations: Skilled Nursing facility Plan: Continue with current plan of care     [place], [place], [place] [facility] SLP [phone] Lamar Sprinkles 03/10/2016, 11:38 AM

## 2016-03-10 NOTE — Progress Notes (Signed)
[place] for Heparin Indication: pulmonary embolus, DVT and stroke  Allergies  Allergen Reactions  . Latex   . 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 [facility]    Patient Measurements: Height: 5\' 3"  (160 cm) Weight: 224 lb 3.3 oz (101.7 kg) IBW/kg (Calculated) : 52.4 Heparin Dosing Weight: 77.5 kg  Vital Signs: Temp: 98 F (36.7 C) (06/24 0925) Temp Source: Oral (06/24 0925) BP: 108/91 mmHg (06/24 0925) Pulse Rate: 98 (06/24 0925)  Labs:  Recent Labs  03/07/16 2249 03/07/16 2254 03/09/16 0645 03/09/16 0650 03/09/16 1832 03/09/16 1833 03/10/16 0433  HGB 12.3 14.6  --  10.6*  --  11.0* 9.8*  HCT 39.4 43.0  --  35.2*  --  36.1 31.6*  PLT 180  --   --  170  --  190 153  APTT 25  --   --   --   --   --   --   LABPROT 14.2  --   --   --   --   --   --   INR 1.08  --   --   --   --   --   --   HEPARINUNFRC  --   --  0.65  --  0.43  --  0.36  CREATININE 0.78 0.70  --   --   --   --   --     Estimated Creatinine Clearance: 60.6 mL/min (by C-G formula based on Cr of 0.7).   Medical History: Past Medical History  Diagnosis Date  . Hypertension   . GERD (gastroesophageal reflux disease)   . Cancer [facility])     breast  . Seasonal allergies    Assessment: 82 yr old female admitted 03/07/16 pm with ischemic stroke and received tPA at 23:13 on 03/07/16.  Stroke workup has revealed bilateral subacute PE on chest CT along with acute right leg DVT and chronic left leg DVT per venous dopplers.   Repeat MRI on 03/08/16 pm noted punctate hemorrhagic transformation, but benefits of anticoagulation outweigh risks per Neuro assessment. For stat CT head without contrast if any neuro changes.  Heparin level is now at goal (0.3) on 750 units/hr. No bleeding or new bruising noted, but hgb continues to decrease to 9.8 today.  Goal of Therapy:  Heparin level 0.3-0.5 units/ml Monitor platelets by anticoagulation protocol: Yes   Plan:   Continue heparin drip at 750 units/hr   Next heparin level and CBC in am.   Monitor closely for any bleeding.   Plan for [place] next week if stable  Erin Hearing PharmD., BCPS Clinical Pharmacist Pager [phone] 03/10/2016 12:46 PM

## 2016-03-11 LAB — CBC
HEMATOCRIT: 31 % — AB (ref 36.0–46.0)
Hemoglobin: 9.6 g/dL — ABNORMAL LOW (ref 12.0–15.0)
MCH: 27.5 pg (ref 26.0–34.0)
MCHC: 31 g/dL (ref 30.0–36.0)
MCV: 88.8 fL (ref 78.0–100.0)
Platelets: 173 10*3/uL (ref 150–400)
RBC: 3.49 MIL/uL — ABNORMAL LOW (ref 3.87–5.11)
RDW: 18.5 % — AB (ref 11.5–15.5)
WBC: 10.8 10*3/uL — ABNORMAL HIGH (ref 4.0–10.5)

## 2016-03-11 LAB — HEPARIN LEVEL (UNFRACTIONATED)
HEPARIN UNFRACTIONATED: 0.27 [IU]/mL — AB (ref 0.30–0.70)
Heparin Unfractionated: 0.36 IU/mL (ref 0.30–0.70)

## 2016-03-11 MED ORDER — EXEMESTANE 25 MG PO TABS
25.0000 mg | ORAL_TABLET | Freq: Every day | ORAL | Status: DC
  Administered 2016-03-13: 25 mg via ORAL
  Filled 2016-03-11 (×2): qty 1

## 2016-03-11 NOTE — NC FL2 (Signed)
[place] LEVEL OF CARE SCREENING TOOL     IDENTIFICATION  Patient Name: Sharon Velasquez Birthdate: Apr 25, 1931 Sex: female Admission Date (Current Location): 03/07/2016  [facility] and [place] Number:  Herbalist and Address:  [facility], [place] [address] [place], [place] [address]      Provider Number: [id number]  Attending Physician Name and Address:  Rosalin Hawking, MD  Relative Name and Phone Number:       Current Level of Care: Hospital Recommended Level of Care: [place] Prior Approval Number:    Date Approved/Denied: 11/13/06 PASRR Number: [id number]  Discharge Plan: SNF    Current Diagnoses: Patient Active Problem List   Diagnosis Date Noted  . CVA (cerebral infarction) 03/07/2016  . 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 Hypotension 02/13/2016  . Iron deficiency anemia 02/13/2016  . Thrombocytopenia ([place]) 02/13/2016  . Hypothermia   . Malnutrition of moderate degree 02/02/2016  . MSSA (methicillin susceptible Staphylococcus aureus) infection 01/31/2016  . Acute encephalopathy 01/31/2016  . UTI (lower urinary tract infection) 01/30/2016  . Hypernatremia 01/30/2016  . Vitamin D deficiency 01/07/2016  . Hypertension   . GERD (gastroesophageal reflux disease)   . Cancer ([place])   . Seasonal allergies   . Stevens-Johnson syndrome ([place]) 12/29/2015  . Candida infection of mouth 12/28/2015  . Colonic constipation 12/28/2015  . Acidosis, hyperchloremic 12/26/2015  . Elevated WBC count 12/26/2015  . Malignant neoplasm of female breast ([place]) 12/25/2015  . Erythematous condition 12/25/2015  . Morbid (severe) obesity due to excess calories ([place]) 12/25/2015  . Malignant neoplasm of kidney ([place]) 05/05/2014    Orientation RESPIRATION BLADDER Height & Weight     Self, Time, Situation, Place  Normal Incontinent Weight: 224 lb 3.3 oz (101.7 kg) Height:  5\' 3"  (160 cm)  BEHAVIORAL SYMPTOMS/MOOD NEUROLOGICAL  BOWEL NUTRITION STATUS      Incontinent Diet  AMBULATORY STATUS COMMUNICATION OF NEEDS Skin   Limited Assist Verbally Normal                       Personal Care Assistance Level of Assistance  Bathing, Feeding, Dressing Bathing Assistance: Limited assistance Feeding assistance: Independent Dressing Assistance: Limited assistance     Functional Limitations Info  Sight, Hearing, Speech Sight Info: Adequate Hearing Info: Adequate Speech Info: Adequate    SPECIAL CARE FACTORS FREQUENCY  Blood pressure, PT (By licensed PT), OT (By licensed OT)                    Contractures Contractures Info: Not present    Additional Factors Info  Code Status, Allergies Code Status Info: FULL Allergies Info: Latex, other           Current Medications (03/11/2016):  This is the current hospital active medication list Current Facility-Administered Medications  Medication Dose Route Frequency Provider Last Rate Last Dose  .  stroke: mapping our early stages of recovery book   Does not apply Once Wallie Char      . 0.9 %  sodium chloride infusion   Intravenous Continuous Wallie Char 75 mL/hr at 03/10/16 1512    . acetaminophen (TYLENOL) tablet 650 mg  650 mg Oral Q4H PRN Wallie Char       Or  . acetaminophen (TYLENOL) suppository 650 mg  650 mg Rectal Q4H PRN Wallie Char      . antiseptic oral rinse (CPC / CETYLPYRIDINIUM CHLORIDE 0.05%) solution 7 mL  7 mL Mouth Rinse  BID Rosalin Hawking, MD   7 mL at 03/10/16 2130  . chlorhexidine (HIBICLENS) 4 % liquid   Topical Daily PRN Rosalin Hawking, MD      . famotidine (PEPCID) tablet 20 mg  20 mg Oral BID Rosalin Hawking, MD   20 mg at 03/10/16 2130  . heparin ADULT infusion 100 units/mL (25000 units/236mL sodium chloride 0.45%)  750 Units/hr Intravenous Continuous Rosalin Hawking, MD 7.5 mL/hr at 03/11/16 0608 750 Units/hr at 03/11/16 0608  . labetalol (NORMODYNE,TRANDATE) injection 10-20 mg  10-20 mg Intravenous Q10 min PRN Rosalin Hawking, MD       . pravastatin (PRAVACHOL) tablet 20 mg  20 mg Oral q1800 Rosalin Hawking, MD   20 mg at 03/10/16 1845  . RESOURCE THICKENUP CLEAR   Oral PRN Rosalin Hawking, MD      . RESOURCE THICKENUP CLEAR   Oral PRN Rosalin Hawking, MD      . sodium chloride flush (NS) 0.9 % injection 10-40 mL  10-40 mL Intracatheter Q12H Rosalin Hawking, MD   10 mL at 03/09/16 2200  . sodium chloride flush (NS) 0.9 % injection 10-40 mL  10-40 mL Intracatheter PRN Rosalin Hawking, MD         Discharge Medications: Please see discharge summary for a list of discharge medications.  Relevant Imaging Results:  Relevant Lab Results:   Additional Information SS#: [id number]  Raymondo Band, LCSW

## 2016-03-11 NOTE — Progress Notes (Signed)
[place] for Heparin Indication: pulmonary embolus, DVT and stroke  Allergies  Allergen Reactions  . Latex   . 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 [facility]    Patient Measurements: Height: 5\' 3"  (160 cm) Weight: 224 lb 3.3 oz (101.7 kg) IBW/kg (Calculated) : 52.4 Heparin Dosing Weight: 77.5 kg  Vital Signs: Temp: 99 F (37.2 C) (06/25 0758) Temp Source: Oral (06/25 0758) BP: 119/56 mmHg (06/25 0758) Pulse Rate: 95 (06/25 0758)  Labs:  Recent Labs  03/09/16 1832 03/09/16 1833 03/10/16 0433 03/11/16 0628 03/11/16 0629  HGB  --  11.0* 9.8*  --  9.6*  HCT  --  36.1 31.6*  --  31.0*  PLT  --  190 153  --  173  HEPARINUNFRC 0.43  --  0.36 0.27*  --     Estimated Creatinine Clearance: 60.6 mL/min (by C-G formula based on Cr of 0.7).   Medical History: Past Medical History  Diagnosis Date  . Hypertension   . GERD (gastroesophageal reflux disease)   . Cancer [facility])     breast  . Seasonal allergies    Assessment: 82 yr old female admitted 03/07/16 pm with ischemic stroke and received tPA at 23:13 on 03/07/16.  Stroke workup has revealed bilateral subacute PE on chest CT along with acute right leg DVT and chronic left leg DVT per venous dopplers.   Repeat MRI on 03/08/16 pm noted punctate hemorrhagic transformation, but benefits of anticoagulation outweigh risks per Neuro assessment. For stat CT head without contrast if any neuro changes.  Heparin level is SUBtherapeutic. Will increase drip. CBC wnl.  Goal of Therapy:  Heparin level 0.3-0.5 units/ml Monitor platelets by anticoagulation protocol: Yes   Plan:    Increase heparin to 850 units/hr   Daily HL, CBC   Check level this evening   F/u plan for PO anticoagulation   Hughes Better, PharmD, BCPS Clinical Pharmacist 03/11/2016 11:50 AM

## 2016-03-11 NOTE — Clinical Social Work Note (Signed)
Clinical Social Work Assessment  Patient Details  Name: Sharon Velasquez MRN: [id number] Date of Birth: 1931-08-30  Date of referral:  03/11/16               Reason for consult:  Facility Placement                Permission sought to share information with:  Case Manager, Family Supports Permission granted to share information::  Yes, Verbal Permission Granted  Name::     Sharon Velasquez  Agency::     Relationship::  Daughter  Contact Information:  [phone]  Housing/Transportation Living arrangements for the past 2 months:  [place], [place] (Patient recently discharged home with daughter from facility on March 05, 2016. ) Source of Information:  Adult Children Water engineer) Patient Interpreter Needed:  None Criminal Activity/Legal Involvement Pertinent to Current Situation/Hospitalization:  No - Comment as needed Significant Relationships:  Adult Children Lives with:  Adult Children Do you feel safe going back to the place where you live?  No Need for family participation in patient care:  Yes (Comment)  Care giving concerns:  Daughter Sharon Velasquez reports concerns regarding blood clots in legs and medication. CSW advised daughter to follow up with MD regarding these concerns. No CSW concerns reported at this time.    Social Worker assessment / plan:  CSW followed with patient's daughter regarding PT/OT recommendation for short term rehab.  Daughter was very polite and pleasant over the phone. Daughter expressed to CSW that she agrees with PT/OT recommendation for SNF placement. Daughter reports patient was recently discharged from [facility] on March 05, 2016. Daughter reports patient discharged home with her and just a few days later ended up having a stroke. Daughter states that this is what brought the patient in the hospital. Daughter expressed some medical concerns and was advised to follow up with MD. Daughter informed CSW that if patient has to  return to SNF placement, her preference is [facility]. CSW informed daughter that clinical information will be sent out to facilities in [place], and [place] will follow up with [facility], Daughter agreeable to plan. CSW to complete FL2 for MD signature and initiate SNF placement process.    Employment status:  Disabled (Comment on whether or not currently receiving Disability) Insurance information:  Medicare PT Recommendations:  [place] / Referral to community resources:  Other (Comment Required) (None provided. Daughter aware of facilities in area. )  Patient/Family's Response to care:  Daughter is agreeable to SNF placement for the patient. Family requesting [facility].   Patient/Family's Understanding of and Emotional Response to Diagnosis, Current Treatment, and Prognosis:  Daughter expressed understanding of current diagnosis and treatment. Agreeable to plan. Daughter reports that she is hoping for better progress for her mother, and was very Patent attorney of CSW services.   Emotional Assessment Appearance:  Other (Comment Required (unable to assess. Conversation via telephone with daughter) Attitude/Demeanor/Rapport:  Unable to Assess Affect (typically observed):  Unable to Assess Orientation:  Oriented to Self, Oriented to Place, Oriented to  Time, Oriented to Situation Alcohol / Substance use:  Not Applicable Psych involvement (Current and /or in the community):  No (Comment)  Discharge Needs  Concerns to be addressed:  Discharge Planning Concerns Readmission within the last 30 days:  No Current discharge risk:  Physical Impairment Barriers to Discharge:  Continued Medical Work up   [employer], LCSW 03/11/2016, 10:05 AM

## 2016-03-11 NOTE — Progress Notes (Signed)
Carter for Heparin Indication: pulmonary embolus, DVT and stroke  Allergies  Allergen Reactions  . Latex   . 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 [facility]    Patient Measurements: Height: 5\' 3"  (160 cm) Weight: 224 lb 3.3 oz (101.7 kg) IBW/kg (Calculated) : 52.4 Heparin Dosing Weight: 77.5 kg  Vital Signs: Temp: 98.6 F (37 C) (06/25 1250) Temp Source: Oral (06/25 1250) BP: 127/60 mmHg (06/25 1250) Pulse Rate: 83 (06/25 1250)  Labs:  Recent Labs  03/09/16 1833 03/10/16 0433 03/11/16 0628 03/11/16 0629 03/11/16 2047  HGB 11.0* 9.8*  --  9.6*  --   HCT 36.1 31.6*  --  31.0*  --   PLT 190 153  --  173  --   HEPARINUNFRC  --  0.36 0.27*  --  0.36    Estimated Creatinine Clearance: 60.6 mL/min (by C-G formula based on Cr of 0.7).   Medical History: Past Medical History  Diagnosis Date  . Hypertension   . GERD (gastroesophageal reflux disease)   . Cancer [facility])     breast  . Seasonal allergies    Assessment: 82 yr old female admitted 03/07/16 pm with ischemic stroke and received tPA at 23:13 on 03/07/16.  Stroke workup has revealed bilateral subacute PE on chest CT along with acute right leg DVT and chronic left leg DVT per venous dopplers.   Repeat MRI on 03/08/16 pm noted punctate hemorrhagic transformation, but benefits of anticoagulation outweigh risks per Neuro assessment. For stat CT head without contrast if any neuro changes.  Heparin level is therapeutic.  Goal of Therapy:  Heparin level 0.3-0.5 units/ml Monitor platelets by anticoagulation protocol: Yes   Plan:    Continue heparin to 850 units/hr   Daily HL, CBC   F/u plan for PO anticoagulation   Vincenza Hews, PharmD, BCPS 03/11/2016, 9:27 PM Pager: [phone]

## 2016-03-11 NOTE — Progress Notes (Signed)
STROKE TEAM PROGRESS NOTE   SUBJECTIVE (INTERVAL HISTORY) Daughter is at bedside. Discussion with daughter regarding findings and pending transcranial Doppler bubble study tomorrow. Patient is on heparin and she will be changed to anticoagulation next week. Patient would like a call back name Sharon Velasquez and she can be rechecked [phone]. No events overnight.  OBJECTIVE Temp:  [98 F (36.7 C)-99 F (37.2 C)] 99 F (37.2 C) (06/25 0758) Pulse Rate:  [88-98] 95 (06/25 0758) Cardiac Rhythm:  [-] Normal sinus rhythm (06/25 0733) Resp:  [20] 20 (06/25 0758) BP: (97-155)/(53-91) 119/56 mmHg (06/25 0758) SpO2:  [93 %-100 %] 96 % (06/25 0758)  CBC:   Recent Labs Lab 03/07/16 2249  03/10/16 0433 03/11/16 0629  WBC 10.2  < > 11.6* 10.8*  NEUTROABS 3.6  --   --   --   HGB 12.3  < > 9.8* 9.6*  HCT 39.4  < > 31.6* 31.0*  MCV 87.2  < > 88.8 88.8  PLT 180  < > 153 173  < > = values in this interval not displayed.  Basic Metabolic Panel:   Recent Labs Lab 03/07/16 2249 03/07/16 2254  NA 141 144  K 4.2 4.2  CL 110 109  CO2 24  --   GLUCOSE 134* 131*  BUN 16 19  CREATININE 0.78 0.70  CALCIUM 9.2  --     Lipid Panel:     Component Value Date/Time   CHOL 243* 03/08/2016 0441   TRIG 101 03/08/2016 0441   HDL 59 03/08/2016 0441   CHOLHDL 4.1 03/08/2016 0441   VLDL 20 03/08/2016 0441   LDLCALC 164* 03/08/2016 0441   HgbA1c:  Lab Results  Component Value Date   HGBA1C 5.3 03/08/2016    IMAGING I have personally reviewed the radiological images below and agree with the radiology interpretations.  CTA NECK  03/08/2016  1. No high-grade or critical stenosis within the major arterial vasculature of the neck. 2. Mild atheromatous plaque about the right bifurcation without significant stenosis. 3. Question soft tissue density within the in pulmonary arteries bilaterally, which may reflect pulmonary emboli, not well evaluated on this exam. Correlation with symptomatology recommended.  Additionally, this could be further assessed with dedicated PE protocol CT.   CTA HEAD  03/08/2016  1. Acute proximal right M2 branch occlusion, anterior division. 2. No other large or proximal arterial branch occlusion within the intracranial circulation. 3. Atheromatous plaque throughout the cavernous ICAs with mild multi focal narrowing. No high-grade or correctable stenosis.   Ct Head Code Stroke W/o Cm 03/07/2016  1. Negative for bleed or other acute intracranial process. 2. Atrophy with chronic left cerebellar encephalomalacia.   MRI BRAIN 03/07/2016 1. Acute R MCA infarct, mild to moderate in size with gyral involvement centered at the right insula and operculum. 2. Positive Petechial hemorrhage, but no mass effect or malignant hemorrhagic transformation. 3. No other acute intracranial abnormality.  LE venous doppler - acute deep vein thrombosis involving the right femoral vein, right popliteal vein, right peroneal vein, and left common femoral vein and Mid femoral vein. 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 Other finding in the left lower extremity at the proximal femoral section-cyst measures 3.5 cm x. 3.4 cm.  Ct Angio Chest Pe W Or Wo Contrast 03/08/2016  IMPRESSION: Bilateral pulmonary artery. These appeared adherent to the main pulmonary artery and may be subacute pulmonary emboli. There is pulmonary artery hypertension. Cardiac enlargement. 15 x 30 mm subpleural density left lower  lobe may represent atelectasis or mass lesion. Follow-up chest CT recommended. Initial follow-up by chest CT without contrast is recommended in 3 months to confirm persistence.   TTE - pending   TCD bubble study - pending (not able to be done yesterday due to IV access, will need to be done next Monday with Dr. Leonie Man)   Pep[place]; Neuro exam is stable, no changes today General - Well nourished, well developed, in no apparent distress.  Ophthalmologic -  Fundi not visualized due to noncooperation.  Cardiovascular - Regular rate and rhythm.  Mental Status -  Level of arousal and orientation to time, place, and person were intact. Language including expression, naming, repetition, comprehension was assessed and found intact, however, moderate dysarthria.  Cranial Nerves II - XII - II - Visual field intact OU. III, IV, VI - Extraocular movements intact. V - Facial sensation intact bilaterally. VII - left facial droop. VIII - Hearing & vestibular intact bilaterally. X - Palate elevates symmetrically, moderate dysarthria. XI - Chin turning & shoulder shrug intact bilaterally. XII - Tongue protrusion to the left.  Motor Strength - The patient's strength was BUE limited shoulder ROM due to arthritis. 3+/5 bilateral tricep and bicep, Left hand 3/5 finger grip and decreased dexterity, right hand grip 5/5. BLE 3-/5 proximal and 5/5 distally.  Bulk was normal and fasciculations were absent.   Motor Tone - Muscle tone was assessed at the neck and appendages and was normal.  Reflexes - The patient's reflexes were 1+ in all extremities and she had no pathological reflexes.  Sensory - Light touch, temperature/pinprick were assessed and were symmetrical.    Coordination - The patient had normal movements in the right hand with no ataxia or dysmetria.  Tremor was absent.  Gait and Station - not tested.   ASSESSMENT/PLAN [name] is a 82 y.o. female with history of hypertension, breast cancer obesity and recent exfoliative dermatitis presenting with garbled speech and left-sided weakness. She received IV t-PA 03/07/2016 at 2313. Pt has been bed bound for the last 2 years.  Stroke:  Non-dominant right MCA infarct, embolic secondary to unknown source. Suspicious for DVT in the setting of PFO. Felt endocarditis d/t recent MSSA bacteremia less likely but still in DDx.  Resultant  Dysarthria, L facial, L hand weakness 3/5  CTA head proximal R  M2 branch occlusion.   CTA neck no emergenct stenosis. ? Pulmonary emboli  CTA chest PE protocol - b/l pulmonary artery thrombosis ? subacute  LE venous doppler - b/l acute DVT and left chronic DVT  MRI  R MCA infarct with petechial hemorrhage.  2D Echo  pending   TCD bubble study not able to complete today due to IV access issue. Will need to be done next Monday with Dr. Leonie Man  LDL 164  HgbA1c 5.3  SCDs for VTE prophylaxis DIET DYS 2 Room service appropriate?: Yes; Fluid consistency:: Nectar Thick  aspirin 81 mg daily prior to admission, now on heparin IV given DVT and PE. Will transition to po NOAC next week if neuro stable.  Ongoing aggressive stroke risk factor management  Therapy recommendations:  pending   Disposition:  pending   DVT and Pulmonary Emboli  suspicion for PE on CTA neck  Check CTA chest - bilateral pulmonary artery thrombosis, ? Subacute  LE venous doppler - b/l acute DVT and left chronic DVT  Started on IV heparin 24 hours after tPA  Will transition to po NOAC next week if neuro stable.  Recent bacteremia  01/30/16 admission for MSSA bacteremia  Treated with Abx for 4 weeks  TEE deferred due to generalized weakness and poor candidate  EF 45% at that time  Afebrile this admission   Blood culture pending  Anemia  Hb 12.3 -> 10.6  No obvious bleeding source  Close monitoring  Hypertension  Stable  Long-term BP goal normotensive  Hyperlipidemia  Home meds:  No statin  LDL 164, goal < 70  Add pravastatin 20mg   Continue on discharge  Other Stroke Risk Factors  Advanced age  Obesity, Body mass index is 39.73 kg/(m^2)., recommend weight loss, diet and exercise as appropriate   Other Active Problems  GERD  Hx breast cancer  Recent exfoliative dermatitis  Anemia. ? Dilution. Check stool guiac. CBC this afternoon at [facility] day # 4   Personally examined patient and images, and have participated in and made  any corrections needed to history, physical, neuro exam,assessment and plan as stated above.  I have personally obtained the history, evaluated lab date, reviewed imaging studies and agree with radiology interpretations.    Sarina Ill, MD Stroke Neurology [phone] [facility]  A total of 30 minutes was spent in with this patient and daughter face-to-face. Over half this time was spent on counseling patient on the embolic stroke diagnosis and different therapeutic options available.        To contact Stroke Continuity provider, please refer to [email or web address] After hours, contact General Neurology

## 2016-03-12 ENCOUNTER — Ambulatory Visit (HOSPITAL_COMMUNITY): Payer: Medicare Other

## 2016-03-12 DIAGNOSIS — I63031 Cerebral infarction due to thrombosis of right carotid artery: Secondary | ICD-10-CM

## 2016-03-12 LAB — CBC
HCT: 29.9 % — ABNORMAL LOW (ref 36.0–46.0)
Hemoglobin: 9.2 g/dL — ABNORMAL LOW (ref 12.0–15.0)
MCH: 26.5 pg (ref 26.0–34.0)
MCHC: 30.8 g/dL (ref 30.0–36.0)
MCV: 86.2 fL (ref 78.0–100.0)
PLATELETS: 157 10*3/uL (ref 150–400)
RBC: 3.47 MIL/uL — ABNORMAL LOW (ref 3.87–5.11)
RDW: 18.1 % — AB (ref 11.5–15.5)
WBC: 10.3 10*3/uL (ref 4.0–10.5)

## 2016-03-12 LAB — HEPARIN LEVEL (UNFRACTIONATED): HEPARIN UNFRACTIONATED: 0.38 [IU]/mL (ref 0.30–0.70)

## 2016-03-12 MED ORDER — APIXABAN 5 MG PO TABS: 5.0000 mg | ORAL_TABLET | Freq: Two times a day (BID) | ORAL | Status: DC

## 2016-03-12 MED ORDER — APIXABAN 5 MG PO TABS
10.0000 mg | ORAL_TABLET | Freq: Two times a day (BID) | ORAL | Status: DC
  Administered 2016-03-12 – 2016-03-13 (×2): 10 mg via ORAL
  Filled 2016-03-12 (×2): qty 2

## 2016-03-12 NOTE — Clinical Social Work Note (Signed)
Patient can return to SNF, [facility] once medically stable for d/c.   No further concerns reported at this time. CSW remains available as needed.   Glendon Axe, MSW, [place] [phone] 03/12/2016 2:04 PM

## 2016-03-12 NOTE — Clinical Social Work Note (Signed)
Clinical Social Worker has contacted [facility] to confirm remaining Medicare days. Facility Rep has extended a bed offer for patient's return and will confirm remaining Medicare days upon returning to her office. CSW awaiting returned phone call.   CSW remains available as needed.   Glendon Axe, MSW, LCSWA [phone] 03/12/2016 12:36 PM

## 2016-03-12 NOTE — Progress Notes (Signed)
Speech Language Pathology Treatment: Dysphagia  Patient Details Name: Sharon Velasquez MRN: [id number] DOB: [birth year] Today's Date: 03/12/2016 Time: [id number] SLP Time Calculation (min) (ACUTE ONLY): 19 min  Assessment / Plan / Recommendation Clinical Impression  SLP paged by RN who reported pt violently coughing with her oatmeal during breakfast this morning and made NPO. Pt has history of dysphagia. Previous ST session recommended and upgraded to Dys 2 with continued nectar consistency. Pt consumed V-8 juice and nectar thick with moderate cues to decrease sip size. Suspect delayed swallow initiation and intermittent pharyngeal residue with pt complaint of globus sensation. No s/s aspiration with puree or nectar. Left sided spill of juice and saliva. Suspect decreased bolus cohesion with premature spill and laryngeal penetration/aspiration with oatmeal this morning. Recommend Dys 1 and nectar thick, crush meds, no straws and continued ST.   HPI HPI: 82 y.o. female with multiple risk factors for stroke presenting with acute right MCA territory ischemic infarction. Pt has a history of dysphagia with throat clearing and oral residuals seen in 5/17, no objective test. Pt put on nectar thick liquids.       SLP Plan  Continue with current plan of care     Recommendations  Diet recommendations: Dysphagia 1 (puree);Nectar-thick liquid Liquids provided via: Cup;No straw Medication Administration: Crushed with puree Supervision: Patient able to self feed;Intermittent supervision to cue for compensatory strategies Compensations: Slow rate;Small sips/bites;Lingual sweep for clearance of pocketing Postural Changes and/or Swallow Maneuvers: Seated upright 90 degrees;Upright 30-60 min after meal             Oral Care Recommendations: Oral care BID Follow up Recommendations: Skilled Nursing facility Plan: Continue with current plan of care              Houston Siren 03/12/2016, 2:15  PM  Orbie Pyo Colvin Caroli.Ed [employer] [phone]

## 2016-03-12 NOTE — Progress Notes (Signed)
Pt coughing and drooling with her breakfast.  Per protocol made patient NPO, ordered speech swallowing evaluation.  Will continue to monitor.  Cori Razor, RN

## 2016-03-12 NOTE — Progress Notes (Signed)
ANTICOAGULATION CONSULT NOTE - Initial Consult  Pharmacy Consult for apixaban Indication: DVT & PE  Allergies  Allergen Reactions  . Latex   . 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 [facility]    Patient Measurements: Height: 5\' 3"  (160 cm) Weight: 224 lb 3.3 oz (101.7 kg) IBW/kg (Calculated) : 52.4 Heparin Dosing Weight:   Vital Signs: Temp: 97.7 F (36.5 C) (06/26 1433) Temp Source: Axillary (06/26 1433) BP: 142/73 mmHg (06/26 1433) Pulse Rate: 85 (06/26 1433)  Labs:  Recent Labs  03/10/16 0433 03/11/16 [id number] 03/11/16 0629 03/11/16 2047 03/12/16 0427  HGB 9.8*  --  9.6*  --  9.2*  HCT 31.6*  --  31.0*  --  29.9*  PLT 153  --  173  --  157  HEPARINUNFRC 0.36 0.27*  --  0.36 0.38    Estimated Creatinine Clearance: 60.6 mL/min (by C-G formula based on Cr of 0.7).   Medical History: Past Medical History  Diagnosis Date  . Hypertension   . GERD (gastroesophageal reflux disease)   . Cancer ([place])     breast  . Seasonal allergies     Medications:  Scheduled:  .  stroke: mapping our early stages of recovery book   Does not apply Once  . antiseptic oral rinse  7 mL Mouth Rinse BID  . exemestane  25 mg Oral QPC breakfast  . famotidine  20 mg Oral BID  . pravastatin  20 mg Oral q1800  . sodium chloride flush  10-40 mL Intracatheter Q12H   Infusions:  . sodium chloride 75 mL/hr at 03/10/16 1512    Assessment: 82 yo female with DVT & PE will be transitioned from heparin to apixaban.  SCr on 06/21 was 0.7  Goal of Therapy:   Monitor platelets by anticoagulation protocol: Yes   Plan:  - apixaban 10 mg po bid x 7 days, then 5 mg bid - monitor for bleeding  Davidmichael Zarazua, Tsz-Yin 03/12/2016,4:49 PM

## 2016-03-12 NOTE — Progress Notes (Signed)
STROKE TEAM PROGRESS NOTE   SUBJECTIVE (INTERVAL HISTORY)  Patient is alert and interactive. She has questions about TCD bubble study.   OBJECTIVE Temp:  [97.7 F (36.5 C)-99.3 F (37.4 C)] 97.7 F (36.5 C) (06/26 1433) Pulse Rate:  [83-90] 85 (06/26 1433) Cardiac Rhythm:  [-] Sinus tachycardia (06/26 0756) Resp:  [19-20] 19 (06/26 1433) BP: (117-142)/(60-73) 142/73 mmHg (06/26 1433) SpO2:  [95 %-100 %] 95 % (06/26 1433)  CBC:   Recent Labs Lab 03/07/16 2249  03/11/16 0629 03/12/16 0427  WBC 10.2  < > 10.8* 10.3  NEUTROABS 3.6  --   --   --   HGB 12.3  < > 9.6* 9.2*  HCT 39.4  < > 31.0* 29.9*  MCV 87.2  < > 88.8 86.2  PLT 180  < > 173 157  < > = values in this interval not displayed.  Basic Metabolic Panel:   Recent Labs Lab 03/07/16 2249 03/07/16 2254  NA 141 144  K 4.2 4.2  CL 110 109  CO2 24  --   GLUCOSE 134* 131*  BUN 16 19  CREATININE 0.78 0.70  CALCIUM 9.2  --     IMAGING  TTE LV EF: 40% - 45%  TCD bubble study -not done as patient has suboptimal windows and poor peripheral IV   PHYSICAL EXAM; Neuro exam is stable, no changes today General - Well nourished, well developed, in no apparent distress.  Ophthalmologic - Fundi not visualized due to noncooperation.  Cardiovascular - Regular rate and rhythm.  Mental Status -  Level of arousal and orientation to time, place, and person were intact. Language including expression, naming, repetition, comprehension was assessed and found intact, however, moderate dysarthria.  Cranial Nerves II - XII - II - Visual field intact OU. III, IV, VI - Extraocular movements intact. V - Facial sensation intact bilaterally. VII - left facial droop. VIII - Hearing & vestibular intact bilaterally. X - Palate elevates symmetrically, moderate dysarthria. XI - Chin turning & shoulder shrug intact bilaterally. XII - Tongue protrusion to the left.  Motor Strength - The patient's strength was BUE limited shoulder  ROM due to arthritis. 3+/5 bilateral tricep and bicep, Left hand 3/5 finger grip and decreased dexterity, right hand grip 5/5. BLE 3-/5 proximal and 5/5 distally.  Bulk was normal and fasciculations were absent.   Motor Tone - Muscle tone was assessed at the neck and appendages and was normal.  Reflexes - The patient's reflexes were 1+ in all extremities and she had no pathological reflexes.  Sensory - Light touch, temperature/pinprick were assessed and were symmetrical.    Coordination - The patient had normal movements in the right hand with no ataxia or dysmetria.  Tremor was absent.  Gait and Station - not tested.   ASSESSMENT/PLAN [name] is a 82 y.o. female with history of hypertension, breast cancer obesity and recent exfoliative dermatitis presenting with garbled speech and left-sided weakness. She received IV t-PA 03/07/2016 at 2313. Pt has been bed bound for the last 2 years.  Stroke:  Non-dominant right MCA infarct, embolic secondary to unknown source. Suspicious for DVT in the setting of PFO. Felt endocarditis d/t recent MSSA bacteremia less likely but still in DDx.  Resultant  Dysarthria, L facial, L hand weakness 3/5  CTA head proximal R M2 branch occlusion.   CTA neck no emergenct stenosis. ? Pulmonary emboli  CTA chest PE protocol - b/l pulmonary artery thrombosis ? subacute  LE venous doppler -  b/l acute DVT and left chronic DVT  MRI  R MCA infarct with petechial hemorrhage.  2D Echo  EF 40-45%  TCD bubble study not able to complete today due to IV access and poor bony window issue.  LDL 164  HgbA1c 5.3  SCDs for VTE prophylaxis DIET - DYS 1 Room service appropriate?: Yes; Fluid consistency:: Nectar Thick  aspirin 81 mg daily prior to admission, now on heparin IV given DVT and PE. Will transition to po NOAC next week if neuro stable.  Ongoing aggressive stroke risk factor management  Therapy recommendations:  pending   Disposition:  pending    DVT and Pulmonary Emboli  suspicion for PE on CTA neck  Check CTA chest - bilateral pulmonary artery thrombosis, ? Subacute  LE venous doppler - b/l acute DVT and left chronic DVT  Started on IV heparin 24 hours after tPA  Will transition to po NOAC     Recent bacteremia  01/30/16 admission for MSSA bacteremia  Treated with Abx for 4 weeks  TEE deferred due to generalized weakness and poor candidate  EF 45% at that time  Afebrile this admission   Blood culture no growth x 4 days  Anemia  Hb 12.3 -> 10.6  No obvious bleeding source  Close monitoring  Hypertension  Stable  Long-term BP goal normotensive  Hyperlipidemia  Home meds:  No statin  LDL 164, goal < 70  Added pravastatin 20mg   Continue on discharge  Other Stroke Risk Factors  Advanced age  Obesity, Body mass index is 39.73 kg/(m^2)., recommend weight loss, diet and exercise as appropriate   Other Active Problems  GERD  Hx breast cancer  Recent exfoliative dermatitis  Anemia. ? Dilution. Check stool guiac. CBC this afternoon at [facility] day # 5   Personally examined patient and images, and have participated in and made any corrections needed to history, physical, neuro exam,assessment and plan as stated above.  I have personally obtained the history, evaluated lab date, reviewed imaging studies and agree with radiology interpretations.  We are unable to do TCD bubble study as patient has very poor skull windows and very tenuous   peripheral IV which is likely to be compromised by bolus injection necessary for the bubble study. Discussed with patient's daughter Sharon Velasquez over the phone and answered questions.Start eliquis and dc iv heparin. Greater than 50% and in this 25 minute visit was spent on counseling and coordination of care about stroke risk and stroke prevention Sharon Contras, MD Medical Director [place] Pager: [phone] 03/12/2016 4:40 PM        To  contact Stroke Continuity provider, please refer to [email or web address] After hours, contact General Neurology

## 2016-03-12 NOTE — Care Management Important Message (Signed)
Important Message  Patient Details  Name: Sharon Velasquez MRN: [id number] Date of Birth: Aug 27, 1931   Medicare Important Message Given:  Yes    Loann Quill 03/12/2016, 8:54 AM

## 2016-03-12 NOTE — Clinical Social Work Placement (Signed)
   CLINICAL SOCIAL WORK PLACEMENT  NOTE  Date:  03/12/2016  Patient Details  Name: Sharon Velasquez MRN: [id number] Date of Birth: 24-Apr-1931  Clinical Social Work is seeking post-discharge placement for this patient at the [place] level of care (*CSW will initial, date and re-position this form in  chart as items are completed):  Yes   Patient/family provided with [place] Work Department's list of facilities offering this level of care within the geographic area requested by the patient (or if unable, by the patient's family).  Yes   Patient/family informed of their freedom to choose among providers that offer the needed level of care, that participate in Medicare, Medicaid or managed care program needed by the patient, have an available bed and are willing to accept the patient.  Yes   Patient/family informed of [place]'s ownership interest in [facility] and [facility], as well as of the fact that they are under no obligation to receive care at these facilities.  PASRR submitted to EDS on       PASRR number received on 03/11/16     Existing PASRR number confirmed on 03/11/16     FL2 transmitted to all facilities in geographic area requested by pt/family on 03/11/16     FL2 transmitted to all facilities within larger geographic area on       Patient informed that his/her managed care company has contracts with or will negotiate with certain facilities, including the following:        Yes   Patient/family informed of bed offers received.  Patient chooses bed at  ([place])     Physician recommends and patient chooses bed at      Patient to be transferred to  ([place]) on  .  Patient to be transferred to facility by       Patient family notified on   of transfer.  Name of family member notified:        PHYSICIAN Please sign FL2     Additional Comment:     _______________________________________________ Rozell Searing, LCSW 03/12/2016, 2:06 PM

## 2016-03-12 NOTE — Progress Notes (Signed)
[place] for Heparin Indication: pulmonary embolus, DVT and stroke  Allergies  Allergen Reactions  . Latex   . 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 [facility]    Patient Measurements: Height: 5\' 3"  (160 cm) Weight: 224 lb 3.3 oz (101.7 kg) IBW/kg (Calculated) : 52.4 Heparin Dosing Weight: 77.5 kg  Vital Signs: Temp: 98.7 F (37.1 C) (06/26 0622) Temp Source: Oral (06/26 0622) BP: 117/66 mmHg (06/26 0622) Pulse Rate: 86 (06/26 0622)  Labs:  Recent Labs  03/10/16 0433 03/11/16 [id number] 03/11/16 0629 03/11/16 2047 03/12/16 0427  HGB 9.8*  --  9.6*  --  9.2*  HCT 31.6*  --  31.0*  --  29.9*  PLT 153  --  173  --  157  HEPARINUNFRC 0.36 0.27*  --  0.36 0.38    Estimated Creatinine Clearance: 60.6 mL/min (by C-G formula based on Cr of 0.7).   Medical History: Past Medical History  Diagnosis Date  . Hypertension   . GERD (gastroesophageal reflux disease)   . Cancer [facility])     breast  . Seasonal allergies    Assessment: 82 yr old female admitted 03/07/16 pm with ischemic stroke and received tPA at 23:13 on 03/07/16.  Stroke workup has revealed bilateral subacute PE on chest CT along with acute right leg DVT and chronic left leg DVT per venous dopplers.   Repeat MRI on 03/08/16 pm noted punctate hemorrhagic transformation, but benefits of anticoagulation outweigh risks per Neuro assessment. For stat CT head without contrast if any neuro changes.  Heparin level remains therapeutic on 850 units/hr. Hgb 9.6>9.2, Plt 173>157, no bleeding reported but pt does have many bruises from issues with IV access.  Goal of Therapy:  Heparin level 0.3-0.5 units/ml Monitor platelets by anticoagulation protocol: Yes   Plan:  --Continue heparin at 850 units/hr (goal HL 0.3-0.5) --Daily HL, CBC --monitor for bleeding --F/u plan for PO anticoagulation  Governor Specking, PharmD Clinical Pharmacy Resident Pager: [phone] 03/12/2016, 8:18  AM

## 2016-03-12 NOTE — Care Management Note (Signed)
Case Management Note  Patient Details  Name: Sharon Velasquez MRN: [id number] Date of Birth: 1931/02/27  Subjective/Objective:                    Action/Plan: Pt in with CVA. Continues on heparin gtt. CM following for discharge needs.   Expected Discharge Date:                  Expected Discharge Plan:  Skilled Nursing Facility  In-House Referral:  Clinical Social Work  Discharge planning Services  CM Consult  Post Acute Care Choice:    Choice offered to:     DME Arranged:    DME Agency:     HH Arranged:    [place] Agency:     Status of Service:  In process, will continue to follow  If discussed at Long Length of Stay Meetings, dates discussed:    Additional Comments:  Pollie Friar, RN 03/12/2016, 11:54 AM

## 2016-03-13 LAB — CULTURE, BLOOD (ROUTINE X 2)
CULTURE: NO GROWTH
CULTURE: NO GROWTH

## 2016-03-13 LAB — CBC
HCT: 31.7 % — ABNORMAL LOW (ref 36.0–46.0)
HEMOGLOBIN: 9.7 g/dL — AB (ref 12.0–15.0)
MCH: 26.4 pg (ref 26.0–34.0)
MCHC: 30.6 g/dL (ref 30.0–36.0)
MCV: 86.1 fL (ref 78.0–100.0)
Platelets: 153 10*3/uL (ref 150–400)
RBC: 3.68 MIL/uL — AB (ref 3.87–5.11)
RDW: 18 % — ABNORMAL HIGH (ref 11.5–15.5)
WBC: 9.7 10*3/uL (ref 4.0–10.5)

## 2016-03-13 LAB — CBC AND DIFFERENTIAL: WBC: 9.7 10^3/mL

## 2016-03-13 MED ORDER — PRAVASTATIN SODIUM 20 MG PO TABS: 20.0000 mg | ORAL_TABLET | Freq: Every day | ORAL | Status: DC

## 2016-03-13 MED ORDER — APIXABAN 5 MG PO TABS: 10.0000 mg | ORAL_TABLET | Freq: Two times a day (BID) | ORAL | Status: DC

## 2016-03-13 MED ORDER — APIXABAN 5 MG PO TABS: 5.0000 mg | ORAL_TABLET | Freq: Two times a day (BID) | ORAL | Status: DC

## 2016-03-13 MED ORDER — RESOURCE THICKENUP CLEAR PO POWD: 1.0000 | ORAL | Status: DC | PRN

## 2016-03-13 NOTE — Discharge Summary (Signed)
Stroke Discharge Summary  Patient ID: Sharon Velasquez   MRN: [id number]      DOB: [birth year]  Date of Admission: 03/07/2016 Date of Discharge: 03/13/2016  Attending Physician:  Garvin Fila, MD, Stroke MD Patient's PCP:  No primary care provider on file.  DISCHARGE DIAGNOSIS:  Stroke: Non-dominant right MCA infarct, embolic secondary to unknown source.  Dysarthria, L facial, L hand weakness secondary to stroke DVT and Pulmonary Emboli Recent MSSA bacteremia Anemia Hypertension Hyperlipidemia Obesity, Body mass index is 39.73 kg/(m^2)., recommend weight loss, diet and exercise as appropriate GERD Recent exfoliative dermatitis Anemia. ? Dilution.    Past Medical History  Diagnosis Date  . Hypertension   . GERD (gastroesophageal reflux disease)   . Cancer (HCC)     breast  . Seasonal allergies    History reviewed. No pertinent past surgical history.    Medication List    STOP taking these medications        aspirin 81 MG chewable tablet     camphor-menthol lotion  Commonly known as:  SARNA     feeding supplement (ENSURE ENLIVE) Liqd     hydrOXYzine 25 MG tablet  Commonly known as:  ATARAX/VISTARIL     lactobacillus acidophilus Tabs tablet     multivitamin with minerals tablet     triamcinolone cream 0.1 %  Commonly known as:  KENALOG      TAKE these medications        apixaban 5 MG Tabs tablet  Commonly known as:  ELIQUIS  Take 2 tablets (10 mg total) by mouth 2 (two) times daily.     apixaban 5 MG Tabs tablet  Commonly known as:  ELIQUIS  Take 1 tablet (5 mg total) by mouth 2 (two) times daily.  Start taking on:  03/19/2016     Calcium Carbonate-Vitamin D 600-200 MG-UNIT Caps  Take 1 tablet by mouth once daily     chlorhexidine 4 % external liquid  Commonly known as:  HIBICLENS  Apply 1 application topically daily as needed.     exemestane 25 MG tablet  Commonly known as:  AROMASIN  Take 25 mg by mouth daily.     ferrous sulfate 325 (65  FE) MG tablet  Take 325 mg by mouth daily with breakfast.     Fish Oil 1000 MG Caps  Take 1,200 mg by mouth daily.     folic acid 1 MG tablet  Commonly known as:  FOLVITE  Take 1 mg by mouth daily.     loratadine 10 MG tablet  Commonly known as:  CLARITIN  Take 10 mg by mouth daily.     pantoprazole 40 MG tablet  Commonly known as:  PROTONIX  Take 40 mg by mouth daily.     pravastatin 20 MG tablet  Commonly known as:  PRAVACHOL  Take 1 tablet (20 mg total) by mouth daily at 6 PM.     RESOURCE THICKENUP CLEAR Powd  Take 120 g by mouth as needed (nectar thick liquid consistnecy).     Vitamin D (Cholecalciferol) 400 units Caps  Give 1 capsule by mouth daily        LABORATORY STUDIES CBC    Component Value Date/Time   WBC 9.7 03/13/2016 0523   RBC 3.68* 03/13/2016 0523   HGB 9.7* 03/13/2016 0523   HCT 31.7* 03/13/2016 0523   PLT 153 03/13/2016 0523   MCV 86.1 03/13/2016 0523   MCH 26.4 03/13/2016 0523   MCHC  30.6 03/13/2016 0523   RDW 18.0* 03/13/2016 0523   LYMPHSABS 4.8* 03/07/2016 2249   MONOABS 1.2* 03/07/2016 2249   EOSABS 0.5 03/07/2016 2249   BASOSABS 0.1 03/07/2016 2249   CMP    Component Value Date/Time   NA 144 03/07/2016 2254   K 4.2 03/07/2016 2254   CL 109 03/07/2016 2254   CO2 24 03/07/2016 2249   GLUCOSE 131* 03/07/2016 2254   BUN 19 03/07/2016 2254   CREATININE 0.70 03/07/2016 2254   CALCIUM 9.2 03/07/2016 2249   PROT 7.1 03/07/2016 2249   ALBUMIN 2.6* 03/07/2016 2249   AST 34 03/07/2016 2249   ALT 16 03/07/2016 2249   ALKPHOS 98 03/07/2016 2249   BILITOT 0.2* 03/07/2016 2249   GFRNONAA >60 03/07/2016 2249   GFRAA >60 03/07/2016 2249   COAGS Lab Results  Component Value Date   INR 1.08 03/07/2016   INR 1.25 02/01/2016   Lipid Panel    Component Value Date/Time   CHOL 243* 03/08/2016 0441   TRIG 101 03/08/2016 0441   HDL 59 03/08/2016 0441   CHOLHDL 4.1 03/08/2016 0441   VLDL 20 03/08/2016 0441   LDLCALC 164* 03/08/2016  0441   HgbA1C  Lab Results  Component Value Date   HGBA1C 5.3 03/08/2016   Urinalysis    Component Value Date/Time   COLORURINE YELLOW 01/30/2016 1359   APPEARANCEUR CLOUDY* 01/30/2016 1359   LABSPEC 1.023 01/30/2016 1359   PHURINE 5.0 01/30/2016 1359   GLUCOSEU NEGATIVE 01/30/2016 1359   HGBUR NEGATIVE 01/30/2016 1359   BILIRUBINUR SMALL* 01/30/2016 1359   KETONESUR 15* 01/30/2016 1359   PROTEINUR NEGATIVE 01/30/2016 1359   NITRITE NEGATIVE 01/30/2016 1359   LEUKOCYTESUR SMALL* 01/30/2016 1359   Alcohol Level    Component Value Date/Time   ETH <5 03/07/2016 2249    SIGNIFICANT DIAGNOSTIC STUDIES  Ct Head Code Stroke W/o Cm 03/07/2016 1. Negative for bleed or other acute intracranial process. 2. Atrophy with chronic left cerebellar encephalomalacia.   MRI BRAIN 03/08/2016  1. Acute right MCA infarct, mild to moderate in size with gyral involvement centered at the right insula and operculum.  2. Positive Petechial hemorrhage, but no mass effect or malignant hemorrhagic transformation. 3. No other acute intracranial abnormality.  03/07/2016 1. Acute R MCA infarct, mild to moderate in size with gyral involvement centered at the right insula and operculum. 2. Positive Petechial hemorrhage, but no mass effect or malignant hemorrhagic transformation. 3. No other acute intracranial abnormality.  CTA NECK  03/08/2016 1. No high-grade or critical stenosis within the major arterial vasculature of the neck. 2. Mild atheromatous plaque about the right bifurcation without significant stenosis. 3. Question soft tissue density within the in pulmonary arteries bilaterally, which may reflect pulmonary emboli, not well evaluated on this exam. Correlation with symptomatology recommended. Additionally, this could be further assessed with dedicated PE protocol CT.   CTA HEAD  03/08/2016 1. Acute proximal right M2 branch occlusion, anterior division. 2. No other large or proximal arterial  branch occlusion within the intracranial circulation. 3. Atheromatous plaque throughout the cavernous ICAs with mild multi focal narrowing. No high-grade or correctable stenosis.   Ct Angio Chest Pe W Or Wo Contrast 03/08/2016 IMPRESSION: Bilateral pulmonary artery. These appeared adherent to the main pulmonary artery and may be subacute pulmonary emboli. There is pulmonary artery hypertension. Cardiac enlargement. 15 x 30 mm subpleural density left lower lobe may represent atelectasis or mass lesion. Follow-up chest CT recommended. Initial follow-up by chest CT without contrast  is recommended in 3 months to confirm persistence.   LE venous doppler - acute deep vein thrombosis involving the right femoral vein, right popliteal vein, right peroneal vein, and left common femoral vein and Mid femoral vein. 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 Other finding in the left lower extremity at the proximal femoral section-cyst measures 3.5 cm x. 3.4 cm.  2D Echocardiogram  - Left ventricle: The cavity size was mildly dilated. Wall thickness was normal. Systolic function was mildly to moderately reduced. The estimated ejection fraction was in the range of 40% to 45%. Diffuse hypokinesis. Left ventricular diastolic function parameters were normal. - Left atrium: The atrium was moderately dilated. - Right ventricle: The cavity size was mildly dilated. Wall thickness was normal. - Right atrium: The atrium was mildly dilated. - Atrial septum: No defect or patent foramen ovale was identified. - Pulmonary arteries: PA peak pressure: 65 mm Hg (S).  TCD bubble study - unable to do due to skull thickness     HISTORY OF PRESENT ILLNESS Sharon Velasquez is an 82 y.o. female with a history of hypertension, breast cancer obesity and recent exfoliative dermatitis, brought to the emergency room and code stroke status following acute onset of garbled speech and left-sided  weakness. Patient has no documented history of previous stroke. She's been on aspirin daily. CT scan of her head showed no acute findings. An area of encephalomalacia involving left cerebellum was noted and unchanged from previous studies. NIH stroke score was 15. She was deemed a candidate for TPA which was administered. Patient showed initial improvement with a return of voluntary movement involving left extremities, which subsequently regressed back to lack of voluntary movement. CT angiogram of head and neck was obtained which was unremarkable except for right M2 branch occlusion demonstrated on cranial study. She was LKW at 9:00 PM on 03/07/2016. CTA showed no emergent large vessel occlusion. She was admitted to the neuro ICU for further evaluation and treatment.    HOSPITAL COURSE [name] is a 82 y.o. female with history of hypertension, breast cancer obesity and recent exfoliative dermatitis presenting with garbled speech and left-sided weakness. She received IV t-PA 03/07/2016 at 2313. Pt has been bed bound for the last 2 years.  Stroke: Non-dominant right MCA infarct, embolic secondary to unknown source. Suspicious for DVT in the setting of PFO. Felt endocarditis d/t recent MSSA bacteremia less likely but still in DDx.  Resultant Dysarthria, L facial, L hand weakness 3/5  CTA head proximal R M2 branch occlusion.   CTA neck no emergenct stenosis. ? Pulmonary emboli  CTA chest PE protocol - b/l pulmonary artery thrombosis ? subacute  LE venous doppler - b/l acute DVT and left chronic DVT  MRI R MCA infarct with petechial hemorrhage.  2D Echo EF 40-45%  TCD bubble study not able to complete today due to IV access and poor bony window issue.   LDL 164  HgbA1c 5.3  aspirin 81 mg daily prior to admission, treated with heparin IV given DVT and PE. Now transitioned to eliquis  Ongoing aggressive stroke risk factor management  Therapy recommendations:  SNF  Disposition: SNF  DVT and Pulmonary Emboli  suspicion for PE on CTA neck  Check CTA chest - bilateral pulmonary artery thrombosis, ? Subacute  LE venous doppler - b/l acute DVT and left chronic DVT  Started on IV heparin 24 hours after tPA  Transitioned to Eliquis  Recent bacteremia  01/30/16 admission for MSSA bacteremia  Treated with Abx for 4 weeks  TEE deferred due to generalized weakness and poor candidate  EF 45% at that time  Afebrile this admission   Blood culture no growth  Anemia  Hb 12.3 -> 10.6->9.7  No obvious bleeding source  Hypertension  Stable  Long-term BP goal normotensive  Hyperlipidemia  Home meds: No statin  LDL 164, goal < 70  Added pravastatin 20mg   Continue on discharge  Other Stroke Risk Factors  Advanced age  Obesity, Body mass index is 39.73 kg/(m^2)., recommend weight loss, diet and exercise as appropriate  Other Active Problems  GERD  Hx breast cancer  Recent exfoliative dermatitis  Anemia. ? Dilution.    DISCHARGE EXAM Blood pressure 121/59, pulse 94, temperature 98.3 F (36.8 C), temperature source Oral, resp. rate 19, height 5\' 3"  (1.6 m), weight 101.7 kg (224 lb 3.3 oz), SpO2 94 %. Neuro exam is stable, no changes today General - Well nourished, well developed, in no apparent distress.  Ophthalmologic - Fundi not visualized due to noncooperation.  Cardiovascular - Regular rate and rhythm.  Mental Status -  Level of arousal and orientation to time, place, and person were intact. Language including expression, naming, repetition, comprehension was assessed and found intact, however, moderate dysarthria.  Cranial Nerves II - XII - II - Visual field intact OU. III, IV, VI - Extraocular movements intact. V - Facial sensation intact bilaterally. VII - left facial droop. VIII - Hearing & vestibular intact bilaterally. X - Palate elevates symmetrically, moderate dysarthria. XI - Chin turning  & shoulder shrug intact bilaterally. XII - Tongue protrusion to the left.  Motor Strength - The patient's strength was BUE limited shoulder ROM due to arthritis. 3+/5 bilateral tricep and bicep, Left hand 3/5 finger grip and decreased dexterity, right hand grip 5/5. BLE 3-/5 proximal and 5/5 distally. Bulk was normal and fasciculations were absent.  Motor Tone - Muscle tone was assessed at the neck and appendages and was normal.  Reflexes - The patient's reflexes were 1+ in all extremities and she had no pathological reflexes.  Sensory - Light touch, temperature/pinprick were assessed and were symmetrical.   Coordination - The patient had normal movements in the right hand with no ataxia or dysmetria. Tremor was absent.  Gait and Station - not tested.  Discharge Diet   DIET - DYS 1 Room service appropriate?: Yes; Fluid consistency:: Nectar Thick liquids  DISCHARGE PLAN  Disposition:  Discharge to skilled nursing facility for ongoing PT, OT and ST.    Eliquis (apixaban) daily for secondary stroke prevention.  Ongoing risk factor control by Primary Care Physician at time of discharge  Follow-up No primary care provider on file. in 2 weeks.  Follow-up with Dr. Antony Contras, Stroke Clinic in 2 months, office to schedule an appointment.  60 minutes were spent preparing discharge.  [place] for Pager information 03/13/2016 3:17 PM   I have personally examined this patient, reviewed notes, independently viewed imaging studies, participated in medical decision making and plan of care. I have made any additions or clarifications directly to the above note. Agree with note above.    Antony Contras, MD Medical Director [facility] Pager: [phone] 03/13/2016 4:15 PM

## 2016-03-13 NOTE — Progress Notes (Signed)
Pt d/c to snf by ambulance. Assessment stable. Attempted to call report to snf with no answer.

## 2016-03-13 NOTE — Progress Notes (Signed)
Instructed pt on procedure. Removed line, held pressure for 81min, and then applied pressure drsg. Instructed pt to keep drsg in place for 24 hours, keeping CDI and report any s/sx of bleeding. Pt VU. Fran Lowes, RN VAST

## 2016-03-13 NOTE — Clinical Social Work Placement (Signed)
   CLINICAL SOCIAL WORK PLACEMENT  NOTE  Date:  03/13/2016  Patient Details  Name: Sharon Velasquez MRN: [id number] Date of Birth: 02-24-1931  Clinical Social Work is seeking post-discharge placement for this patient at the [place] level of care (*CSW will initial, date and re-position this form in  chart as items are completed):  Yes   Patient/family provided with [facility] Work Department's list of facilities offering this level of care within the geographic area requested by the patient (or if unable, by the patient's family).  Yes   Patient/family informed of their freedom to choose among providers that offer the needed level of care, that participate in Medicare, Medicaid or managed care program needed by the patient, have an available bed and are willing to accept the patient.  Yes   Patient/family informed of [facility]'s ownership interest in [facility] and [facility], as well as of the fact that they are under no obligation to receive care at these facilities.  PASRR submitted to EDS on       PASRR number received on 03/11/16     Existing PASRR number confirmed on 03/11/16     FL2 transmitted to all facilities in geographic area requested by pt/family on 03/11/16     FL2 transmitted to all facilities within larger geographic area on       Patient informed that his/her managed care company has contracts with or will negotiate with certain facilities, including the following:        Yes   Patient/family informed of bed offers received.  Patient chooses bed at  ([facility])     Physician recommends and patient chooses bed at      Patient to be transferred to  [facility]) on 03/13/16.  Patient to be transferred to facility by  [facility] )     Patient family notified on 03/13/16 of transfer.  Name of family member notified:   (Pt's dtr, [place])     PHYSICIAN Please sign FL2,  Please prepare priority discharge summary, including medications     Additional Comment:    _______________________________________________ Rozell Searing, LCSW 03/13/2016, 3:15 PM

## 2016-03-13 NOTE — Clinical Social Work Note (Signed)
Clinical Social Worker facilitated patient discharge including contacting patient family and facility to confirm patient discharge plans.  Clinical information faxed to facility and family agreeable with plan.  CSW arranged ambulance transport via [facility] to [facility].  RN to call report prior to discharge.  Clinical Social Worker will sign off for now as social work intervention is no longer needed. Please consult [place] again if new need arises.  Glendon Axe, MSW, LCSWA [phone] 03/13/2016 3:15 PM

## 2016-03-13 NOTE — Progress Notes (Signed)
PT Cancellation Note  Patient Details Name: Sharon Velasquez MRN: [id number] DOB: [birth year]   Cancelled Treatment:    Reason Eval/Treat Not Completed: Patient declined, no reason specified Pt declined and reported she is getting ready to leave for SNF. Educated pt that she will likely not have therapy there today.    [place] April, PTA Pager: [phone]   03/13/2016, 2:22 PM

## 2016-03-13 NOTE — Discharge Instructions (Signed)
Information on my medicine - ELIQUIS (apixaban)  This medication education was reviewed with me or my healthcare representative as part of my discharge preparation.  The pharmacist that spoke with me during my hospital stay was:  Linda Hedges, [facility]  Why was Eliquis prescribed for you? Eliquis was prescribed to treat blood clots that may have been found in the veins of your legs (deep vein thrombosis) or in your lungs (pulmonary embolism) and to reduce the risk of them occurring again.  What do You need to know about Eliquis ? The starting dose is 10 mg (two 5 mg tablets) taken TWICE daily for the FIRST SEVEN (7) DAYS, then in the evening on 03/19/2016 the dose is reduced to ONE 5 mg tablet taken TWICE daily.  Eliquis may be taken with or without food.   Try to take the dose about the same time in the morning and in the evening. If you have difficulty swallowing the tablet whole please discuss with your pharmacist how to take the medication safely.  Take Eliquis exactly as prescribed and DO NOT stop taking Eliquis without talking to the doctor who prescribed the medication.  Stopping may increase your risk of developing a new blood clot.  Refill your prescription before you run out.  After discharge, you should have regular check-up appointments with your healthcare provider that is prescribing your Eliquis.    What do you do if you miss a dose? If a dose of ELIQUIS is not taken at the scheduled time, take it as soon as possible on the same day and twice-daily administration should be resumed. The dose should not be doubled to make up for a missed dose.  Important Safety Information A possible side effect of Eliquis is bleeding. You should call your healthcare provider right away if you experience any of the following: ? Bleeding from an injury or your nose that does not stop. ? Unusual colored urine (red or dark brown) or unusual colored stools (red or black). ? Unusual bruising for  unknown reasons. ? A serious fall or if you hit your head (even if there is no bleeding).  Some medicines may interact with Eliquis and might increase your risk of bleeding or clotting while on Eliquis. To help avoid this, consult your healthcare provider or pharmacist prior to using any new prescription or non-prescription medications, including herbals, vitamins, non-steroidal anti-inflammatory drugs (NSAIDs) and supplements.  This website has more information on Eliquis (apixaban): http://www.eliquis.com/eliquis/home

## 2016-03-14 ENCOUNTER — Non-Acute Institutional Stay (SKILLED_NURSING_FACILITY): Payer: Medicare Other | Admitting: Internal Medicine

## 2016-03-14 ENCOUNTER — Encounter: Payer: Self-pay | Admitting: Internal Medicine

## 2016-03-14 DIAGNOSIS — I5022 Chronic systolic (congestive) heart failure: Secondary | ICD-10-CM

## 2016-03-14 DIAGNOSIS — C50912 Malignant neoplasm of unspecified site of left female breast: Secondary | ICD-10-CM

## 2016-03-14 DIAGNOSIS — I110 Hypertensive heart disease with heart failure: Secondary | ICD-10-CM

## 2016-03-14 DIAGNOSIS — I63511 Cerebral infarction due to unspecified occlusion or stenosis of right middle cerebral artery: Secondary | ICD-10-CM

## 2016-03-14 DIAGNOSIS — I2602 Saddle embolus of pulmonary artery with acute cor pulmonale: Secondary | ICD-10-CM | POA: Diagnosis not present

## 2016-03-14 DIAGNOSIS — D509 Iron deficiency anemia, unspecified: Secondary | ICD-10-CM | POA: Diagnosis not present

## 2016-03-14 DIAGNOSIS — C50911 Malignant neoplasm of unspecified site of right female breast: Secondary | ICD-10-CM

## 2016-03-14 DIAGNOSIS — E559 Vitamin D deficiency, unspecified: Secondary | ICD-10-CM | POA: Diagnosis not present

## 2016-03-14 DIAGNOSIS — K219 Gastro-esophageal reflux disease without esophagitis: Secondary | ICD-10-CM | POA: Diagnosis not present

## 2016-03-14 DIAGNOSIS — E785 Hyperlipidemia, unspecified: Secondary | ICD-10-CM | POA: Diagnosis not present

## 2016-03-14 NOTE — Progress Notes (Signed)
MRN: [id number] Name: Sharon Velasquez  Sex: female Age: 82 y.o. DOB: 1931-04-23  [place] #:  Facility/Room:[facility] / 114 P Level Of Care: SNF Provider: Noah Delaine. Sheppard Coil, MD Emergency Contacts: Extended Emergency Contact Information Primary Emergency Contact: Malachi-Moncivais,Faith Address: [address] [place], [place] [address] [place] Phone: [phone] Mobile Phone: [phone] Relation: Daughter Secondary Emergency Contact: Malena Peer [place] Mobile Phone: [phone] Relation: Other  Code Status: Full Code  Allergies: Latex and Other  Chief Complaint  Patient presents with  . New Admit To SNF    Admit to Facility    HPI: Patient is 82 y.o. female with hypertension, breast cancer obesity and recent exfoliative dermatitis presenting with garbled speech and left-sided weakness. Pt was admitted to [facility] from 6/21-27 where she received TPA. There was initial improvement which did not last. Pt is admitted to SNF for  OT/PT. While at SNF pt will be followed for breast CA, tx with aromasin, Vit D def, tx with replacement and ERD, tx with protonix.  Past Medical History  Diagnosis Date  . Hypertension   . GERD (gastroesophageal reflux disease)   . Cancer (HCC)     breast  . Seasonal allergies   . Lump or mass in breast   . Malignant neoplasm without specification of site ([place])   . Intertrigo   . Glossitis   . Acute renal insufficiency   . Esophageal reflux   . Osteoarthritis   . Non-compliance   . Neuroendocrine tumor   . Dysphagia   . Osteoarthritis   . Encounter for preventive health examination   . Primary localized osteoarthritis of right hip   . Mammogram abnormal   . Atopic dermatitis   . Cellulitis   . Localized osteoarthritis of shoulder   . Joint pain   . Morbid obesity ([place])   . Subacromial bursitis   . Increased urinary frequency   . Neuroendocrine tumor   . Lymphedema   . Rheumatoid arthritis  ([place])   . Aromatase inhibitor use   . Adenocarcinoma of breast ([place])   . Truncal muscle weakness   . Elevated blood pressure reading without diagnosis of hypertension   . Debilitated patient     History reviewed. No pertinent past surgical history.    Medication List       This list is accurate as of: 03/14/16 11:59 PM.  Always use your most recent med list.               apixaban 5 MG Tabs tablet  Commonly known as:  ELIQUIS  Take 1 tablet (5 mg total) by mouth 2 (two) times daily.     Calcium Carbonate-Vitamin D 600-200 MG-UNIT Caps  Take 1 tablet by mouth once daily     chlorhexidine 4 % external liquid  Commonly known as:  HIBICLENS  Apply 1 application topically daily as needed.     exemestane 25 MG tablet  Commonly known as:  AROMASIN  Take 25 mg by mouth daily.     ferrous sulfate 325 (65 FE) MG tablet  Take 325 mg by mouth daily with breakfast.     Fish Oil 1000 MG Caps  Take 1,200 mg by mouth daily.     folic acid 1 MG tablet  Commonly known as:  FOLVITE  Take 1 mg by mouth daily.     loratadine 10 MG tablet  Commonly known as:  CLARITIN  Take 10 mg by mouth daily.     pantoprazole 40 MG tablet  Commonly known as:  PROTONIX  Take 40 mg by mouth daily.     pravastatin 20 MG tablet  Commonly known as:  PRAVACHOL  Take 1 tablet (20 mg total) by mouth daily at 6 PM.     RESOURCE THICKENUP CLEAR Powd  Take 120 g by mouth as needed (nectar thick liquid consistnecy).     Vitamin D (Cholecalciferol) 400 units Caps  Give 1 capsule by mouth daily        No orders of the defined types were placed in this encounter.    Immunization History  Administered Date(s) Administered  . PPD Test 01/13/2016    Social History  Substance Use Topics  . Smoking status: Never Smoker   . Smokeless tobacco: Not on file  . Alcohol Use: No    Family history is + CA  Family History  Problem Relation Age of Onset  . Cancer Other       Review of  Systems  DATA OBTAINED: from patient, nurse GENERAL:  no fevers, fatigue, appetite changes SKIN: No itching, rash or wounds EYES: No eye pain, redness, discharge EARS: No earache, tinnitus, change in hearing NOSE: No congestion, drainage or bleeding  MOUTH/THROAT: No mouth or tooth pain, No sore throat RESPIRATORY: No cough, wheezing, SOB CARDIAC: No chest pain, palpitations, lower extremity edema  GI: No abdominal pain, No N/V/D or constipation, No heartburn or reflux  GU: No dysuria, frequency or urgency, or incontinence  MUSCULOSKELETAL: No unrelieved bone/joint pain NEUROLOGIC: No headache, dizziness  PSYCHIATRIC: No c/o anxiety or sadness   Filed Vitals:   03/14/16 1003  BP: 124/82  Pulse: 96  Temp: 97.3 F (36.3 C)  Resp: 20    SpO2 Readings from Last 1 Encounters:  03/13/16 94%        Physical Exam  GENERAL APPEARANCE: Alert, conversant,  No acute distress.  SKIN: No diaphoresis rash HEAD: Normocephalic, atraumatic  EYES: Conjunctiva/lids clear. Pupils round, reactive. EOMs intact.  EARS: External exam WNL, canals clear. Hearing grossly normal.  NOSE: No deformity or discharge.  MOUTH/THROAT: Lips w/o lesions  RESPIRATORY: Breathing is even, unlabored. Lung sounds are clear   CARDIOVASCULAR: Heart RRR no murmurs, rubs or gallops. No peripheral edema.   GASTROINTESTINAL: Abdomen is soft, non-tender, not distended w/ normal bowel sounds. GENITOURINARY: Bladder non tender, not distended  MUSCULOSKELETAL: No abnormal joints or musculature NEUROLOGIC:  Cranial nerves 2-12 grossly intact except L facial weakness,dysarthria; LUE  weakness PSYCHIATRIC: Mood and affect appropriate to situation, no behavioral issues  Patient Active Problem List   Diagnosis Date Noted  . CVA (cerebral infarction) 03/07/2016  . 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 Hypotension 02/13/2016  . Iron deficiency anemia 02/13/2016  . Thrombocytopenia ([place]) 02/13/2016  . Hypothermia   .  Malnutrition of moderate degree 02/02/2016  . MSSA (methicillin susceptible Staphylococcus aureus) infection 01/31/2016  . Acute encephalopathy 01/31/2016  . UTI (lower urinary tract infection) 01/30/2016  . Hypernatremia 01/30/2016  . Vitamin D deficiency 01/07/2016  . Hypertension   . GERD (gastroesophageal reflux disease)   . Cancer ([place])   . Seasonal allergies   . Stevens-Johnson syndrome ([place]) 12/29/2015  . Candida infection of mouth 12/28/2015  . Colonic constipation 12/28/2015  . Acidosis, hyperchloremic 12/26/2015  . Elevated WBC count 12/26/2015  . Malignant neoplasm of female breast ([place]) 12/25/2015  . Erythematous condition 12/25/2015  . Morbid (severe) obesity due to excess calories ([place])  12/25/2015  . Malignant neoplasm of kidney ([place]) 05/05/2014       Component Value Date/Time   WBC 9.7 03/13/2016 0523   WBC 9.7 03/13/2016   RBC 3.68* 03/13/2016 0523   HGB 9.7* 03/13/2016 0523   HCT 31.7* 03/13/2016 0523   PLT 153 03/13/2016 0523   MCV 86.1 03/13/2016 0523   LYMPHSABS 4.8* 03/07/2016 2249   MONOABS 1.2* 03/07/2016 2249   EOSABS 0.5 03/07/2016 2249   BASOSABS 0.1 03/07/2016 2249        Component Value Date/Time   NA 144 03/07/2016 2254   NA 144 03/07/2016   K 4.2 03/07/2016 2254   CL 109 03/07/2016 2254   CO2 24 03/07/2016 2249   GLUCOSE 131* 03/07/2016 2254   BUN 19 03/07/2016 2254   BUN 19 03/07/2016   CREATININE 0.70 03/07/2016 2254   CREATININE 0.7 03/07/2016   CALCIUM 9.2 03/07/2016 2249   PROT 7.1 03/07/2016 2249   ALBUMIN 2.6* 03/07/2016 2249   AST 34 03/07/2016 2249   ALT 16 03/07/2016 2249   ALKPHOS 98 03/07/2016 2249   BILITOT 0.2* 03/07/2016 2249   GFRNONAA >60 03/07/2016 2249   GFRAA >60 03/07/2016 2249    Lab Results  Component Value Date   HGBA1C 5.3 03/08/2016    Lab Results  Component Value Date   CHOL 243* 03/08/2016   HDL 59 03/08/2016   LDLCALC 164* 03/08/2016   TRIG 101 03/08/2016   CHOLHDL 4.1 03/08/2016     Ct  Angio Head W/cm &/or Wo Cm  03/08/2016  EXAM: CT ANGIOGRAPHY HEAD AND NECK TECHNIQUE: Multidetector CT imaging of the head and neck was performed using the standard protocol during bolus administration of intravenous contrast. Multiplanar CT image reconstructions and MIPs were obtained to evaluate the vascular anatomy. Carotid stenosis measurements (when applicable) are obtained utilizing NASCET criteria, using the distal internal carotid diameter as the denominator. CONTRAST:  50 cc of Isovue 370 COMPARISON:  Noncontrast head CT from earlier same day FINDINGS: CTA NECK Aortic arch: Aortic arch of normal caliber with normal branch pattern. No high-grade stenosis at the origin of the great vessels. Visualized subclavian arteries patent. Scattered plaque within the arch itself and at the origin of the great vessels. Right carotid system: Right common carotid artery patent from its origin to the bifurcation peer scattered a centric plaque about the right bifurcation without flow-limiting stenosis. Right ICA patent from the bifurcation to the skullbase without stenosis, dissection, or occlusion. Medialization of the right carotid artery system into the retropharyngeal space. Left carotid system: Left common carotid artery patent from its origin to the bifurcation. No significant plaque about the left bifurcation. Left ICA patent from the bifurcation to the skullbase without stenosis, dissection, or occlusion. Medialization of the left carotid artery system into the retropharyngeal space. Vertebral arteries:Both vertebral arteries arise from the subclavian arteries. Vertebral arteries patent within the neck without stenosis, dissection, or occlusion. Skeleton: No acute osseous abnormality. No worrisome lytic or blastic osseous lesions. Other neck: Partially visualized lungs are grossly clear. There is question of a centric filling defects within the proximal main pulmonary arteries bilaterally (series 401, image 7),  which may reflect pulmonary emboli. These the most consistent with subacute to chronic in nature given their a centric appearance. This is not entirely certain, as this soft tissue density main lie external to the vasculature are. Remainder of the visualized superior mediastinum within normal limits. Thyroid grossly unremarkable. No adenopathy within the neck. No acute soft tissue abnormality.  CTA HEAD Anterior circulation: Petrous segments patent bilaterally. Scattered calcified plaque within the cavernous/ supraclinoid ICAs with secondary mild multi focal narrowing. A1 segments patent. Left A1 segment hypoplastic. Anterior communicating artery normal. Anterior cerebral arteries well opacified. Left M1 segment widely patent. Left MCA bifurcation normal. Distal left MCA branches well opacified. Right M1 segment patent. Right MCA bifurcation normal. There is abrupt occlusion of a proximal right M2 branch, anterior division. Attenuated/absent flow distally. Right MCA branches otherwise opacified. Posterior circulation: Vertebral arteries patent to the vertebrobasilar junction. Basilar artery widely patent. Superior cerebral arteries patent bilaterally. P1 segments hypoplastic. Prominent bilateral posterior communicating arteries. Posterior cerebral arteries well opacified to their distal aspects. Venous sinuses: Patent without evidence for venous sinus thrombosis. Anatomic variants: Hypoplastic vertebrobasilar system with prominent bilateral posterior communicating arteries. No aneurysm or vascular malformation. Delayed phase: Not performed. IMPRESSION: CTA NECK IMPRESSION: 1. No high-grade or critical stenosis within the major arterial vasculature of the neck. 2. Mild atheromatous plaque about the right bifurcation without significant stenosis. 3. Question soft tissue density within the in pulmonary arteries bilaterally, which may reflect pulmonary emboli, not well evaluated on this exam. Correlation with  symptomatology recommended. Additionally, this could be further assessed with dedicated PE protocol CT. CTA HEAD IMPRESSION: 1. Acute proximal right M2 branch occlusion, anterior division. 2. No other large or proximal arterial branch occlusion within the intracranial circulation. 3. Atheromatous plaque throughout the cavernous ICAs with mild multi focal narrowing. No high-grade or correctable stenosis. Critical Value/emergent results were called by telephone at the time of interpretation on 03/08/2016 at 12:26 am to Dr. Dina Rich, who verbally acknowledged these results. Electronically Signed   By: Jeannine Boga M.D.   On: 03/08/2016 00:49   Ct Angio Neck W Or Wo Contrast  03/08/2016  EXAM: CT ANGIOGRAPHY HEAD AND NECK TECHNIQUE: Multidetector CT imaging of the head and neck was performed using the standard protocol during bolus administration of intravenous contrast. Multiplanar CT image reconstructions and MIPs were obtained to evaluate the vascular anatomy. Carotid stenosis measurements (when applicable) are obtained utilizing NASCET criteria, using the distal internal carotid diameter as the denominator. CONTRAST:  50 cc of Isovue 370 COMPARISON:  Noncontrast head CT from earlier same day FINDINGS: CTA NECK Aortic arch: Aortic arch of normal caliber with normal branch pattern. No high-grade stenosis at the origin of the great vessels. Visualized subclavian arteries patent. Scattered plaque within the arch itself and at the origin of the great vessels. Right carotid system: Right common carotid artery patent from its origin to the bifurcation peer scattered a centric plaque about the right bifurcation without flow-limiting stenosis. Right ICA patent from the bifurcation to the skullbase without stenosis, dissection, or occlusion. Medialization of the right carotid artery system into the retropharyngeal space. Left carotid system: Left common carotid artery patent from its origin to the bifurcation. No  significant plaque about the left bifurcation. Left ICA patent from the bifurcation to the skullbase without stenosis, dissection, or occlusion. Medialization of the left carotid artery system into the retropharyngeal space. Vertebral arteries:Both vertebral arteries arise from the subclavian arteries. Vertebral arteries patent within the neck without stenosis, dissection, or occlusion. Skeleton: No acute osseous abnormality. No worrisome lytic or blastic osseous lesions. Other neck: Partially visualized lungs are grossly clear. There is question of a centric filling defects within the proximal main pulmonary arteries bilaterally (series 401, image 7), which may reflect pulmonary emboli. These the most consistent with subacute to chronic in nature given their a centric appearance. This  is not entirely certain, as this soft tissue density main lie external to the vasculature are. Remainder of the visualized superior mediastinum within normal limits. Thyroid grossly unremarkable. No adenopathy within the neck. No acute soft tissue abnormality. CTA HEAD Anterior circulation: Petrous segments patent bilaterally. Scattered calcified plaque within the cavernous/ supraclinoid ICAs with secondary mild multi focal narrowing. A1 segments patent. Left A1 segment hypoplastic. Anterior communicating artery normal. Anterior cerebral arteries well opacified. Left M1 segment widely patent. Left MCA bifurcation normal. Distal left MCA branches well opacified. Right M1 segment patent. Right MCA bifurcation normal. There is abrupt occlusion of a proximal right M2 branch, anterior division. Attenuated/absent flow distally. Right MCA branches otherwise opacified. Posterior circulation: Vertebral arteries patent to the vertebrobasilar junction. Basilar artery widely patent. Superior cerebral arteries patent bilaterally. P1 segments hypoplastic. Prominent bilateral posterior communicating arteries. Posterior cerebral arteries well  opacified to their distal aspects. Venous sinuses: Patent without evidence for venous sinus thrombosis. Anatomic variants: Hypoplastic vertebrobasilar system with prominent bilateral posterior communicating arteries. No aneurysm or vascular malformation. Delayed phase: Not performed. IMPRESSION: CTA NECK IMPRESSION: 1. No high-grade or critical stenosis within the major arterial vasculature of the neck. 2. Mild atheromatous plaque about the right bifurcation without significant stenosis. 3. Question soft tissue density within the in pulmonary arteries bilaterally, which may reflect pulmonary emboli, not well evaluated on this exam. Correlation with symptomatology recommended. Additionally, this could be further assessed with dedicated PE protocol CT. CTA HEAD IMPRESSION: 1. Acute proximal right M2 branch occlusion, anterior division. 2. No other large or proximal arterial branch occlusion within the intracranial circulation. 3. Atheromatous plaque throughout the cavernous ICAs with mild multi focal narrowing. No high-grade or correctable stenosis. Critical Value/emergent results were called by telephone at the time of interpretation on 03/08/2016 at 12:26 am to Dr. Dina Rich, who verbally acknowledged these results. Electronically Signed   By: Jeannine Boga M.D.   On: 03/08/2016 00:49   Ct Angio Chest Pe W Or Wo Contrast  03/08/2016  CLINICAL DATA:  Pulmonary embolism.  Short of breath EXAM: CT ANGIOGRAPHY CHEST WITH CONTRAST TECHNIQUE: Multidetector CT imaging of the chest was performed using the standard protocol during bolus administration of intravenous contrast. Multiplanar CT image reconstructions and MIPs were obtained to evaluate the vascular anatomy. CONTRAST:  100 mL Isovue 370 IV COMPARISON:  Chest x-ray 01/31/2016 FINDINGS: Image quality is suboptimal due to large patient size and patient motion during the scan. Distal pulmonary vessels are poorly visualized due to motion. Intraluminal filling  defects in the main pulmonary arteries bilaterally. These appear adherent to the wall and are consistent with pulmonary emboli. Possible subacute emboli. No change from CTA neck 03/07/2016. There may be some embolus on in and left lower lobe pulmonary artery, not well evaluated due to motion. Pulmonary arteries are diffusely dilated suggesting pulmonary artery hypertension. RV/LV ratio 1.1 Negative for aortic dissection or aneurysm. Mild atherosclerotic calcification in the aorta. Cardiac enlargement without pericardial effusion. Mild apical scarring on the right. 15 x 30 mm subpleural density in the left lower lobe laterally, possibly atelectasis. Follow-up recommended. No pleural effusion Negative for mass or adenopathy.  No definite pneumonia. Multiple gallstones which are calcified. Left upper pole renal cyst measuring 2.5 cm. Review of the MIP images confirms the above findings. IMPRESSION: Bilateral pulmonary artery. These appeared adherent to the main pulmonary artery and may be subacute pulmonary emboli. There is pulmonary artery hypertension. Cardiac enlargement. 15 x 30 mm subpleural density left lower lobe may represent atelectasis  or mass lesion. Follow-up chest CT recommended. Initial follow-up by chest CT without contrast is recommended in 3 months to confirm persistence. This recommendation follows the consensus statement: Recommendations for the Management of Subsolid Pulmonary Nodules Detected at CT: A Statement from the [place] as published in Radiology 2013; 266:304-317. Cholelithiasis These results were called by telephone at the time of interpretation on 03/08/2016 at 10:07 am to Dr. Rosalin Hawking , who verbally acknowledged these results. Electronically Signed   By: Franchot Gallo M.D.   On: 03/08/2016 10:08   Mr Brain Wo Contrast  03/08/2016  CLINICAL DATA:  82 year old female with acute onset slurred speech and left side weakness. Code stroke patient status post tPA on 03/07/2016.  Initial encounter. EXAM: MRI HEAD WITHOUT CONTRAST TECHNIQUE: Multiplanar, multiecho pulse sequences of the brain and surrounding structures were obtained without intravenous contrast. COMPARISON:  CTA head neck 03/07/2016.  Brain MRI 02/01/2016. FINDINGS: Major intracranial vascular flow voids are stable since 02/01/2016. There is gyriform restricted diffusion along the right insula and operculum (series 8, image 25). This tracks cephalad, with mild superior frontal gyral involvement. Associated T2 and FLAIR hyperintensity with scattered petechial hemorrhage (series 12, image 64). No associated mass effect. No right basal ganglia involvement. No other lobar involvement in the right hemisphere. No contralateral left hemisphere or posterior fossa restricted diffusion. Chronic left cerebellar encephalomalacia. Scattered bilateral cerebral white matter T2 and FLAIR hyperintensity. No ventriculomegaly. No midline shift or intracranial mass lesion. No other acute intracranial hemorrhage. No other acute signal abnormality in the brain. Negative pituitary, cervicomedullary junction and visualized cervical spine. Normal bone marrow signal. Visible internal auditory structures appear normal. Stable and well pneumatized visualized paranasal sinuses and mastoids. Stable orbit and scalp soft tissues. IMPRESSION: 1. Acute right MCA infarct, mild to moderate in size with gyral involvement centered at the right insula and operculum. 2. Positive Petechial hemorrhage, but no mass effect or malignant hemorrhagic transformation. 3. Study discussed by telephone with Dr. Wallie Char on 03/08/2016 at 22:04 . 4. No other acute intracranial abnormality. Electronically Signed   By: Genevie Ann M.D.   On: 03/08/2016 22:05   Ct Head Code Stroke W/o Cm  03/07/2016  CLINICAL DATA:  CODE STROKE PT WITH LEFT SIDE WEAKNESS, RIGHT SIDE GAZE CALL REPORT TO DR Nicole Kindred [phone] EXAM: CT HEAD WITHOUT CONTRAST TECHNIQUE: Contiguous axial images  were obtained from the base of the skull through the vertex without intravenous contrast. COMPARISON:  MR 02/01/2016 and previous FINDINGS: Brain: Mild atrophy. Stable encephalomalacia in the left cerebellar hemisphere. No evidence of acute infarction, hemorrhage, extra-axial collection, ventriculomegaly, or mass effect. Vascular: No hyperdense vessel or unexpected calcification. Atherosclerotic and physiologic intracranial calcifications. Skull: Negative for fracture or focal lesion. Sinuses/Orbits: No acute findings. Other: None. IMPRESSION: 1. Negative for bleed or other acute intracranial process. 2. Atrophy with chronic left cerebellar encephalomalacia. Critical Value/emergent results were called by telephone at the time of interpretation on 03/07/2016 at 10:50 pm to Dr. Nicole Kindred, who verbally acknowledged these results. Electronically Signed   By: Lucrezia Europe M.D.   On: 03/07/2016 22:58    Not all labs, radiology exams or other studies done during hospitalization come through on my EPIC note; however they are reviewed by me.    Assessment and Plan  Stroke: Non-dominant right MCA infarct, embolic secondary to unknown source. Suspicious for DVT in the setting of PFO. Felt endocarditis d/t recent MSSA bacteremia less likely but still in DDx.  Resultant Dysarthria, L facial, L hand weakness  3/5  CTA head proximal R M2 branch occlusion.   CTA neck no emergenct stenosis. ? Pulmonary emboli  CTA chest PE protocol - b/l pulmonary artery thrombosis ? subacute  LE venous doppler - b/l acute DVT and left chronic DVT  MRI R MCA infarct with petechial hemorrhage.  2D Echo EF 40-45%  TCD bubble study not able to complete today due to IV access and poor bony window issue.  LDL 164  HgbA1c 5.3  aspirin 81 mg daily prior to admission, treated with heparin IV given DVT and PE. Now transitioned to eliquis  Ongoing aggressive stroke risk factor management  Therapy recommendations: SNF SNF -  admitted for OT/PT; will cont tx with eliquis  DVT and Pulmonary Emboli  suspicion for PE on CTA neck  Check CTA chest - bilateral pulmonary artery thrombosis, ? Subacute with pulmonary HTN  LE venous doppler - b/l acute DVT and left chronic DVT  Started on IV heparin 24 hours after tPA  SNF - cont long term eliquis; CT chest without contrast in 3 months to confirm persistence  Chronic systolic CHF   EF [id number] SNF - pt stable on no meds;will monitor  Recent bacteremia  01/30/16 admission for MSSA bacteremia  Treated with Abx for 4 weeks  TEE deferred due to generalized weakness and poor candidate  EF 45% at that time  Afebrile this admission   Blood culture no growth  Anemia  Hb 12.3 -> 10.6->9.7  No obvious bleeding source  SNF -cont iron daily  f/u CBC  Hypertension  Stable SNF - Long-term BP goal normotensive; controlled on no            meds;VS monitor daily  Hyperlipidemia  Home meds: No statin  LDL 164, goal < 70  SNF - pt started on pravachol 20 mg daily, will continue fish oil 1200 mg daily  GERD SNF - stable, cont protonix40 mg daily  VITAMIN D DEF SNF- stable, cont 400 u daily  HX BREAST CA SNF- cont aromasin  25 mg daily   Obesity, Body mass index is 39.73 kg/(m^2).,  SNF - recommend weight loss, diet and exercise as appropriate   Time spent > 45 min;> 50% of time with patient was spent reviewing records, labs, tests and studies, counseling and developing plan of care  Webb Silversmith D. Sheppard Coil, MD

## 2016-03-25 ENCOUNTER — Encounter: Payer: Self-pay | Admitting: Internal Medicine

## 2016-03-25 DIAGNOSIS — I5022 Chronic systolic (congestive) heart failure: Secondary | ICD-10-CM | POA: Insufficient documentation

## 2016-03-25 DIAGNOSIS — E785 Hyperlipidemia, unspecified: Secondary | ICD-10-CM | POA: Insufficient documentation

## 2016-03-25 DIAGNOSIS — I2692 Saddle embolus of pulmonary artery without acute cor pulmonale: Secondary | ICD-10-CM | POA: Insufficient documentation

## 2016-03-29 ENCOUNTER — Non-Acute Institutional Stay (SKILLED_NURSING_FACILITY): Payer: Medicare Other | Admitting: Internal Medicine

## 2016-03-29 ENCOUNTER — Encounter: Payer: Self-pay | Admitting: Internal Medicine

## 2016-03-29 DIAGNOSIS — D509 Iron deficiency anemia, unspecified: Secondary | ICD-10-CM | POA: Diagnosis not present

## 2016-03-29 DIAGNOSIS — E785 Hyperlipidemia, unspecified: Secondary | ICD-10-CM

## 2016-03-29 DIAGNOSIS — I2692 Saddle embolus of pulmonary artery without acute cor pulmonale: Secondary | ICD-10-CM | POA: Diagnosis not present

## 2016-03-29 DIAGNOSIS — I63511 Cerebral infarction due to unspecified occlusion or stenosis of right middle cerebral artery: Secondary | ICD-10-CM

## 2016-03-29 NOTE — Progress Notes (Signed)
Patient ID: Sharon Velasquez, female   DOB: 10/10/1930, 82 y.o.   MRN: [id number] MRN: [id number] Name: Sharon Velasquez  Sex: female Age: 82 y.o. DOB: 1930-09-22  [place] #:  Facility/Room:[facility] / 114 P Level Of Care: SNF Provider: Noah Delaine. Sheppard Coil, MD Emergency Contacts: Extended Emergency Contact Information Primary Emergency Contact: Malachi-Oviatt,Faith Address: [address] [place], [place] [address] [place] Phone: [phone] Mobile Phone: [phone] Relation: Daughter Secondary Emergency Contact: Malena Peer [place] Mobile Phone: [phone] Relation: Other  Code Status: Full Code  Allergies: Latex and Other  Chief Complaint  Patient presents with  . Discharge Note    HPI: Patient is 82 y.o. female with hypertension, breast cancer obesity and recent exfoliative dermatitis Resented to the hospital with garbled speech and left-sided weakness. Pt was admitted to [facility] from 6/21-27 where she received TPA. There was initial improvement which did not last. Pt was admitted to SNF for  OT/PT. While at SNF pt  followed for breast CA, tx with aromasin, Vit D def, tx with replacement and ERD, tx with protonix.  Her stay here has been fairly unremarkable she has gained strength-she has a very supportive daughter and will be going home with her-she still requires a lift and that apparently is already in home.  She will need home health support including PT OT and nursing to evaluate and treat her multiple medical issues  Past Medical History  Diagnosis Date  . Hypertension   . GERD (gastroesophageal reflux disease)   . Cancer (HCC)     breast  . Seasonal allergies   . Lump or mass in breast   . Malignant neoplasm without specification of site ([place])   . Intertrigo   . Glossitis   . Acute renal insufficiency   . Esophageal reflux   . Osteoarthritis   . Non-compliance   . Neuroendocrine tumor   . Dysphagia   .  Osteoarthritis   . Encounter for preventive health examination   . Primary localized osteoarthritis of right hip   . Mammogram abnormal   . Atopic dermatitis   . Cellulitis   . Localized osteoarthritis of shoulder   . Joint pain   . Morbid obesity ([place])   . Subacromial bursitis   . Increased urinary frequency   . Neuroendocrine tumor   . Lymphedema   . Rheumatoid arthritis ([place])   . Aromatase inhibitor use   . Adenocarcinoma of breast ([place])   . Truncal muscle weakness   . Elevated blood pressure reading without diagnosis of hypertension   . Debilitated patient     No past surgical history on file.    Medication List       This list is accurate as of: 03/29/16  5:13 PM.  Always use your most recent med list.               apixaban 5 MG Tabs tablet  Commonly known as:  ELIQUIS  Take 1 tablet (5 mg total) by mouth 2 (two) times daily.     Calcium Carbonate-Vitamin D 600-200 MG-UNIT Caps  Take 1 tablet by mouth once daily     chlorhexidine 4 % external liquid  Commonly known as:  HIBICLENS  Apply 1 application topically daily as needed.     exemestane 25 MG tablet  Commonly known as:  AROMASIN  Take 25 mg by mouth daily.     ferrous sulfate 325 (65  FE) MG tablet  Take 325 mg by mouth daily with breakfast.     Fish Oil 1000 MG Caps  Take 1,200 mg by mouth daily.     folic acid 1 MG tablet  Commonly known as:  FOLVITE  Take 1 mg by mouth daily.     loratadine 10 MG tablet  Commonly known as:  CLARITIN  Take 10 mg by mouth daily.     pantoprazole 40 MG tablet  Commonly known as:  PROTONIX  Take 40 mg by mouth daily.     pravastatin 20 MG tablet  Commonly known as:  PRAVACHOL  Take 1 tablet (20 mg total) by mouth daily at 6 PM.     RESOURCE THICKENUP CLEAR Powd  Take 120 g by mouth as needed (nectar thick liquid consistnecy).     Vitamin D (Cholecalciferol) 400 units Caps  Give 1 capsule by mouth daily        No orders of the defined types were  placed in this encounter.    Immunization History  Administered Date(s) Administered  . PPD Test 01/13/2016    Social History  Substance Use Topics  . Smoking status: Never Smoker   . Smokeless tobacco: Not on file  . Alcohol Use: No    Family history is + CA  Family History  Problem Relation Age of Onset  . Cancer Other       Review of Systems  DATA OBTAINED: from patient, nurse GENERAL:  no fevers, fatigue, appetite changes SKIN: No itching, rash or wounds EYES: No eye pain, redness, discharge EARS: No earache, tinnitus, change in hearing NOSE: No congestion, drainage or bleeding  MOUTH/THROAT: No mouth or tooth pain, No sore throat RESPIRATORY: No cough, wheezing, SOB CARDIAC: No chest pain, palpitations, lower extremity edema  GI: No abdominal pain, No N/V/D or constipation, No heartburn or reflux  GU: No dysuria, frequency or urgency, or incontinence  MUSCULOSKELETAL: No unrelieved bone/joint pain NEUROLOGIC: No headache, dizziness  PSYCHIATRIC: No c/o anxiety or sadness looking forward to going home  Filed Vitals:   03/29/16 1706  BP: 100/73  Pulse: 90  Temp: 97 F (36.1 C)  Resp: 16    SpO2 Readings from Last 1 Encounters:  03/13/16 94%        Physical Exam  GENERAL APPEARANCE: Alert, conversant,  No acute distress. Sitting comfortably in her chair SKIN: No diaphoresis rash HEAD: Normocephalic, atraumatic  EYES: Conjunctiva/lids clear. Pupils round, reactive. EOMs intact.  EARS: External exam WNL, canals clear. Hearing grossly normal.  NOSE: No deformity or discharge.  MOUTH/THROAT: Lips w/o lesions  RESPIRATORY: Breathing is even, unlabored. Lung sounds are clear   CARDIOVASCULAR: Heart RRR no murmurs, rubs or gallops. No peripheral edema.   GASTROINTESTINAL: Abdomen is soft, non-tender, not distended w/ normal bowel sounds. GENITOURINARY: Bladder non tender, not distended  MUSCULOSKELETAL: No abnormal joints or musculature NEUROLOGIC:   Cranial nerves 2-12 grossly intact except L facial weakness,dysarthria; LUE  weakness and bilateral lower extremity weakness--per therapy has gained some strength during her stay here PSYCHIATRIC: Mood and affect appropriate to situation, no behavioral issues  Patient Active Problem List   Diagnosis Date Noted  . 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 Chronic systolic CHF (congestive heart failure) ([place]) 03/25/2016  . Hyperlipidemia 03/25/2016  . Acute right MCA stroke ([place]) 03/07/2016  . 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 Hypotension 02/13/2016  . Iron deficiency anemia 02/13/2016  . Thrombocytopenia ([place]) 02/13/2016  . Hypothermia   .  Malnutrition of moderate degree 02/02/2016  . MSSA (methicillin susceptible Staphylococcus aureus) infection 01/31/2016  . Acute encephalopathy 01/31/2016  . UTI (lower urinary tract infection) 01/30/2016  . Hypernatremia 01/30/2016  . Vitamin D deficiency 01/07/2016  . Hypertensive heart disease with CHF (congestive heart failure) ([place])   . GERD (gastroesophageal reflux disease)   . Cancer ([place])   . Seasonal allergies   . Stevens-Johnson syndrome ([place]) 12/29/2015  . Candida infection of mouth 12/28/2015  . Colonic constipation 12/28/2015  . Acidosis, hyperchloremic 12/26/2015  . Elevated WBC count 12/26/2015  . Malignant neoplasm of female breast ([place]) 12/25/2015  . Erythematous condition 12/25/2015  . Morbid (severe) obesity due to excess calories ([place]) 12/25/2015  . Malignant neoplasm of kidney ([place]) 05/05/2014       Component Value Date/Time   WBC 9.7 03/13/2016 0523   WBC 9.7 03/13/2016   RBC 3.68* 03/13/2016 0523   HGB 9.7* 03/13/2016 0523   HCT 31.7* 03/13/2016 0523   PLT 153 03/13/2016 0523   MCV 86.1 03/13/2016 0523   LYMPHSABS 4.8* 03/07/2016 2249   MONOABS 1.2* 03/07/2016 2249   EOSABS 0.5 03/07/2016 2249   BASOSABS 0.1 03/07/2016 2249        Component Value Date/Time   NA 144 03/07/2016 2254   NA 144  03/07/2016   K 4.2 03/07/2016 2254   CL 109 03/07/2016 2254   CO2 24 03/07/2016 2249   GLUCOSE 131* 03/07/2016 2254   BUN 19 03/07/2016 2254   BUN 19 03/07/2016   CREATININE 0.70 03/07/2016 2254   CREATININE 0.7 03/07/2016   CALCIUM 9.2 03/07/2016 2249   PROT 7.1 03/07/2016 2249   ALBUMIN 2.6* 03/07/2016 2249   AST 34 03/07/2016 2249   ALT 16 03/07/2016 2249   ALKPHOS 98 03/07/2016 2249   BILITOT 0.2* 03/07/2016 2249   GFRNONAA >60 03/07/2016 2249   GFRAA >60 03/07/2016 2249    Lab Results  Component Value Date   HGBA1C 5.3 03/08/2016    Lab Results  Component Value Date   CHOL 243* 03/08/2016   HDL 59 03/08/2016   LDLCALC 164* 03/08/2016   TRIG 101 03/08/2016   CHOLHDL 4.1 03/08/2016     Ct Angio Head W/cm &/or Wo Cm  03/08/2016  EXAM: CT ANGIOGRAPHY HEAD AND NECK TECHNIQUE: Multidetector CT imaging of the head and neck was performed using the standard protocol during bolus administration of intravenous contrast. Multiplanar CT image reconstructions and MIPs were obtained to evaluate the vascular anatomy. Carotid stenosis measurements (when applicable) are obtained utilizing NASCET criteria, using the distal internal carotid diameter as the denominator. CONTRAST:  50 cc of Isovue 370 COMPARISON:  Noncontrast head CT from earlier same day FINDINGS: CTA NECK Aortic arch: Aortic arch of normal caliber with normal branch pattern. No high-grade stenosis at the origin of the great vessels. Visualized subclavian arteries patent. Scattered plaque within the arch itself and at the origin of the great vessels. Right carotid system: Right common carotid artery patent from its origin to the bifurcation peer scattered a centric plaque about the right bifurcation without flow-limiting stenosis. Right ICA patent from the bifurcation to the skullbase without stenosis, dissection, or occlusion. Medialization of the right carotid artery system into the retropharyngeal space. Left carotid system:  Left common carotid artery patent from its origin to the bifurcation. No significant plaque about the left bifurcation. Left ICA patent from the bifurcation to the skullbase without stenosis, dissection, or occlusion. Medialization of the left carotid artery system into the retropharyngeal  space. Vertebral arteries:Both vertebral arteries arise from the subclavian arteries. Vertebral arteries patent within the neck without stenosis, dissection, or occlusion. Skeleton: No acute osseous abnormality. No worrisome lytic or blastic osseous lesions. Other neck: Partially visualized lungs are grossly clear. There is question of a centric filling defects within the proximal main pulmonary arteries bilaterally (series 401, image 7), which may reflect pulmonary emboli. These the most consistent with subacute to chronic in nature given their a centric appearance. This is not entirely certain, as this soft tissue density main lie external to the vasculature are. Remainder of the visualized superior mediastinum within normal limits. Thyroid grossly unremarkable. No adenopathy within the neck. No acute soft tissue abnormality. CTA HEAD Anterior circulation: Petrous segments patent bilaterally. Scattered calcified plaque within the cavernous/ supraclinoid ICAs with secondary mild multi focal narrowing. A1 segments patent. Left A1 segment hypoplastic. Anterior communicating artery normal. Anterior cerebral arteries well opacified. Left M1 segment widely patent. Left MCA bifurcation normal. Distal left MCA branches well opacified. Right M1 segment patent. Right MCA bifurcation normal. There is abrupt occlusion of a proximal right M2 branch, anterior division. Attenuated/absent flow distally. Right MCA branches otherwise opacified. Posterior circulation: Vertebral arteries patent to the vertebrobasilar junction. Basilar artery widely patent. Superior cerebral arteries patent bilaterally. P1 segments hypoplastic. Prominent bilateral  posterior communicating arteries. Posterior cerebral arteries well opacified to their distal aspects. Venous sinuses: Patent without evidence for venous sinus thrombosis. Anatomic variants: Hypoplastic vertebrobasilar system with prominent bilateral posterior communicating arteries. No aneurysm or vascular malformation. Delayed phase: Not performed. IMPRESSION: CTA NECK IMPRESSION: 1. No high-grade or critical stenosis within the major arterial vasculature of the neck. 2. Mild atheromatous plaque about the right bifurcation without significant stenosis. 3. Question soft tissue density within the in pulmonary arteries bilaterally, which may reflect pulmonary emboli, not well evaluated on this exam. Correlation with symptomatology recommended. Additionally, this could be further assessed with dedicated PE protocol CT. CTA HEAD IMPRESSION: 1. Acute proximal right M2 branch occlusion, anterior division. 2. No other large or proximal arterial branch occlusion within the intracranial circulation. 3. Atheromatous plaque throughout the cavernous ICAs with mild multi focal narrowing. No high-grade or correctable stenosis. Critical Value/emergent results were called by telephone at the time of interpretation on 03/08/2016 at 12:26 am to Dr. Dina Rich, who verbally acknowledged these results. Electronically Signed   By: Jeannine Boga M.D.   On: 03/08/2016 00:49   Ct Angio Neck W Or Wo Contrast  03/08/2016  EXAM: CT ANGIOGRAPHY HEAD AND NECK TECHNIQUE: Multidetector CT imaging of the head and neck was performed using the standard protocol during bolus administration of intravenous contrast. Multiplanar CT image reconstructions and MIPs were obtained to evaluate the vascular anatomy. Carotid stenosis measurements (when applicable) are obtained utilizing NASCET criteria, using the distal internal carotid diameter as the denominator. CONTRAST:  50 cc of Isovue 370 COMPARISON:  Noncontrast head CT from earlier same day  FINDINGS: CTA NECK Aortic arch: Aortic arch of normal caliber with normal branch pattern. No high-grade stenosis at the origin of the great vessels. Visualized subclavian arteries patent. Scattered plaque within the arch itself and at the origin of the great vessels. Right carotid system: Right common carotid artery patent from its origin to the bifurcation peer scattered a centric plaque about the right bifurcation without flow-limiting stenosis. Right ICA patent from the bifurcation to the skullbase without stenosis, dissection, or occlusion. Medialization of the right carotid artery system into the retropharyngeal space. Left carotid system: Left common carotid artery  patent from its origin to the bifurcation. No significant plaque about the left bifurcation. Left ICA patent from the bifurcation to the skullbase without stenosis, dissection, or occlusion. Medialization of the left carotid artery system into the retropharyngeal space. Vertebral arteries:Both vertebral arteries arise from the subclavian arteries. Vertebral arteries patent within the neck without stenosis, dissection, or occlusion. Skeleton: No acute osseous abnormality. No worrisome lytic or blastic osseous lesions. Other neck: Partially visualized lungs are grossly clear. There is question of a centric filling defects within the proximal main pulmonary arteries bilaterally (series 401, image 7), which may reflect pulmonary emboli. These the most consistent with subacute to chronic in nature given their a centric appearance. This is not entirely certain, as this soft tissue density main lie external to the vasculature are. Remainder of the visualized superior mediastinum within normal limits. Thyroid grossly unremarkable. No adenopathy within the neck. No acute soft tissue abnormality. CTA HEAD Anterior circulation: Petrous segments patent bilaterally. Scattered calcified plaque within the cavernous/ supraclinoid ICAs with secondary mild multi  focal narrowing. A1 segments patent. Left A1 segment hypoplastic. Anterior communicating artery normal. Anterior cerebral arteries well opacified. Left M1 segment widely patent. Left MCA bifurcation normal. Distal left MCA branches well opacified. Right M1 segment patent. Right MCA bifurcation normal. There is abrupt occlusion of a proximal right M2 branch, anterior division. Attenuated/absent flow distally. Right MCA branches otherwise opacified. Posterior circulation: Vertebral arteries patent to the vertebrobasilar junction. Basilar artery widely patent. Superior cerebral arteries patent bilaterally. P1 segments hypoplastic. Prominent bilateral posterior communicating arteries. Posterior cerebral arteries well opacified to their distal aspects. Venous sinuses: Patent without evidence for venous sinus thrombosis. Anatomic variants: Hypoplastic vertebrobasilar system with prominent bilateral posterior communicating arteries. No aneurysm or vascular malformation. Delayed phase: Not performed. IMPRESSION: CTA NECK IMPRESSION: 1. No high-grade or critical stenosis within the major arterial vasculature of the neck. 2. Mild atheromatous plaque about the right bifurcation without significant stenosis. 3. Question soft tissue density within the in pulmonary arteries bilaterally, which may reflect pulmonary emboli, not well evaluated on this exam. Correlation with symptomatology recommended. Additionally, this could be further assessed with dedicated PE protocol CT. CTA HEAD IMPRESSION: 1. Acute proximal right M2 branch occlusion, anterior division. 2. No other large or proximal arterial branch occlusion within the intracranial circulation. 3. Atheromatous plaque throughout the cavernous ICAs with mild multi focal narrowing. No high-grade or correctable stenosis. Critical Value/emergent results were called by telephone at the time of interpretation on 03/08/2016 at 12:26 am to Dr. Dina Rich, who verbally acknowledged these  results. Electronically Signed   By: Jeannine Boga M.D.   On: 03/08/2016 00:49   Ct Angio Chest Pe W Or Wo Contrast  03/08/2016  CLINICAL DATA:  Pulmonary embolism.  Short of breath EXAM: CT ANGIOGRAPHY CHEST WITH CONTRAST TECHNIQUE: Multidetector CT imaging of the chest was performed using the standard protocol during bolus administration of intravenous contrast. Multiplanar CT image reconstructions and MIPs were obtained to evaluate the vascular anatomy. CONTRAST:  100 mL Isovue 370 IV COMPARISON:  Chest x-ray 01/31/2016 FINDINGS: Image quality is suboptimal due to large patient size and patient motion during the scan. Distal pulmonary vessels are poorly visualized due to motion. Intraluminal filling defects in the main pulmonary arteries bilaterally. These appear adherent to the wall and are consistent with pulmonary emboli. Possible subacute emboli. No change from CTA neck 03/07/2016. There may be some embolus on in and left lower lobe pulmonary artery, not well evaluated due to motion. Pulmonary arteries are  diffusely dilated suggesting pulmonary artery hypertension. RV/LV ratio 1.1 Negative for aortic dissection or aneurysm. Mild atherosclerotic calcification in the aorta. Cardiac enlargement without pericardial effusion. Mild apical scarring on the right. 15 x 30 mm subpleural density in the left lower lobe laterally, possibly atelectasis. Follow-up recommended. No pleural effusion Negative for mass or adenopathy.  No definite pneumonia. Multiple gallstones which are calcified. Left upper pole renal cyst measuring 2.5 cm. Review of the MIP images confirms the above findings. IMPRESSION: Bilateral pulmonary artery. These appeared adherent to the main pulmonary artery and may be subacute pulmonary emboli. There is pulmonary artery hypertension. Cardiac enlargement. 15 x 30 mm subpleural density left lower lobe may represent atelectasis or mass lesion. Follow-up chest CT recommended. Initial follow-up  by chest CT without contrast is recommended in 3 months to confirm persistence. This recommendation follows the consensus statement: Recommendations for the Management of Subsolid Pulmonary Nodules Detected at CT: A Statement from the [employer] as published in Radiology 2013; 266:304-317. Cholelithiasis These results were called by telephone at the time of interpretation on 03/08/2016 at 10:07 am to Dr. Rosalin Hawking , who verbally acknowledged these results. Electronically Signed   By: Franchot Gallo M.D.   On: 03/08/2016 10:08   Mr Brain Wo Contrast  03/08/2016  CLINICAL DATA:  82 year old female with acute onset slurred speech and left side weakness. Code stroke patient status post tPA on 03/07/2016. Initial encounter. EXAM: MRI HEAD WITHOUT CONTRAST TECHNIQUE: Multiplanar, multiecho pulse sequences of the brain and surrounding structures were obtained without intravenous contrast. COMPARISON:  CTA head neck 03/07/2016.  Brain MRI 02/01/2016. FINDINGS: Major intracranial vascular flow voids are stable since 02/01/2016. There is gyriform restricted diffusion along the right insula and operculum (series 8, image 25). This tracks cephalad, with mild superior frontal gyral involvement. Associated T2 and FLAIR hyperintensity with scattered petechial hemorrhage (series 12, image 64). No associated mass effect. No right basal ganglia involvement. No other lobar involvement in the right hemisphere. No contralateral left hemisphere or posterior fossa restricted diffusion. Chronic left cerebellar encephalomalacia. Scattered bilateral cerebral white matter T2 and FLAIR hyperintensity. No ventriculomegaly. No midline shift or intracranial mass lesion. No other acute intracranial hemorrhage. No other acute signal abnormality in the brain. Negative pituitary, cervicomedullary junction and visualized cervical spine. Normal bone marrow signal. Visible internal auditory structures appear normal. Stable and well  pneumatized visualized paranasal sinuses and mastoids. Stable orbit and scalp soft tissues. IMPRESSION: 1. Acute right MCA infarct, mild to moderate in size with gyral involvement centered at the right insula and operculum. 2. Positive Petechial hemorrhage, but no mass effect or malignant hemorrhagic transformation. 3. Study discussed by telephone with Dr. Wallie Char on 03/08/2016 at 22:04 . 4. No other acute intracranial abnormality. Electronically Signed   By: Genevie Ann M.D.   On: 03/08/2016 22:05   Ct Head Code Stroke W/o Cm  03/07/2016  CLINICAL DATA:  CODE STROKE PT WITH LEFT SIDE WEAKNESS, RIGHT SIDE GAZE CALL REPORT TO DR Nicole Kindred [id number] EXAM: CT HEAD WITHOUT CONTRAST TECHNIQUE: Contiguous axial images were obtained from the base of the skull through the vertex without intravenous contrast. COMPARISON:  MR 02/01/2016 and previous FINDINGS: Brain: Mild atrophy. Stable encephalomalacia in the left cerebellar hemisphere. No evidence of acute infarction, hemorrhage, extra-axial collection, ventriculomegaly, or mass effect. Vascular: No hyperdense vessel or unexpected calcification. Atherosclerotic and physiologic intracranial calcifications. Skull: Negative for fracture or focal lesion. Sinuses/Orbits: No acute findings. Other: None. IMPRESSION: 1. Negative for bleed or other acute  intracranial process. 2. Atrophy with chronic left cerebellar encephalomalacia. Critical Value/emergent results were called by telephone at the time of interpretation on 03/07/2016 at 10:50 pm to Dr. Nicole Kindred, who verbally acknowledged these results. Electronically Signed   By: Lucrezia Europe M.D.   On: 03/07/2016 22:58    Not all labs, radiology exams or other studies done during hospitalization come through on my EPIC note; however they are reviewed by me.    Assessment and Plan  Stroke: Non-dominant right MCA infarct, embolic secondary to unknown source. Suspicious for DVT in the setting of PFO. Felt endocarditis d/t  recent MSSA bacteremia less likely but still in DDx.  Resultant Dysarthria, L facial, L hand weakness 3/5  CTA head proximal R M2 branch occlusion.   CTA neck no emergenct stenosis. ? Pulmonary emboli  No need continued PT and OT as well as nursing support has strong family support family does have a lift at home    DVT and Pulmonary Emboli  suspicion for PE on CTA neck  Check CTA chest - bilateral pulmonary artery thrombosis, ? Subacute with pulmonary HTN  LE venous doppler - b/l acute DVT and left chronic DVT  Started on IV heparin 24 hours after tPA SNF - cont long term eliquis; CT chest without contrast in 3 months to confirm persistence Chronic systolic CHF   EF [id number] SNF - pt stable on no meds;will monitor  Recent bacteremia  01/30/16 admission for MSSA bacteremia  Treated with Abx for 4 weeks  TEE deferred due to generalized weakness and poor candidate  EF 45% at that time  Afebrile this admission   Blood culture no growth  Anemia  Hb 12.3 -> 10.6->9.7  No obvious bleeding source   -cont iron daily  f/u CBC be drawn by home health on Monday, July 17 notify primary care provider of results  Hypertension  Stable SNF - Long-term BP goal normotensive; controlled on no            meds  Hyperlipidemia  Home meds: No statin  LDL 164, goal < 70  SNF - pt started on pravachol 20 mg daily, will continue fish oil 1200 mg daily I'll or primary care provider  GERD SNF - stable, cont protonix40 mg daily  VITAMIN D DEF SNF- stable, cont 400 u daily  HX BREAST CA SNF- cont aromasin  25 mg daily   O Again patient will be going home with her daughter who is very supportive she has all needed equipment at home including a lift-will need home health support with PT and OT and nursing to evaluate and treat medical conditions.  Home health to draw CBC BMP on Monday, July 17 notify primary care provider of results.  [id number] note greater than 30  minutes spent on this discharge summary-greater than 50% of time spent coordinating plan of care for numerous diagnoses-prescriptions have been written

## 2016-06-20 ENCOUNTER — Other Ambulatory Visit: Payer: Self-pay

## 2016-06-20 NOTE — Patient Outreach (Signed)
First telephone outreach to patient to collect mRS. No answer, left message for return call.   Sharon Velasquez

## 2016-06-22 ENCOUNTER — Other Ambulatory Visit: Payer: Self-pay

## 2016-06-22 NOTE — Patient Outreach (Signed)
Second outreach attempt to obtain mRS. No answer, left message for return call.    Sharon Velasquez  [facility] Care Management Assistant

## 2016-06-25 ENCOUNTER — Other Ambulatory Visit: Payer: Self-pay

## 2016-06-25 NOTE — Patient Outreach (Signed)
Telephone outreach to patient to obtain mRS was successfully completed. mRS = [place], [place] Management Assistant

## 2017-03-31 IMAGING — MR MR HEAD W/O CM
10 of 11 series · 39 of 48 positions shown · non-contrast
Comparison: CTA head neck 03/07/2016.  Brain MRI 02/01/2016.

CLINICAL DATA: 85-year-old female with acute onset slurred speech
and left side weakness. Code stroke patient status post tPA on
03/07/2016. Initial encounter.

EXAM:
MRI HEAD WITHOUT CONTRAST
TECHNIQUE: Multiplanar, multiecho pulse sequences of the brain and surrounding
structures were obtained without intravenous contrast.

[Series 3: DWI · axial · 3.0mm · 0.94mm/px · z∈[-88,+58]mm · 5 of 50 slices shown (1 of 3)]
[im 1/50]
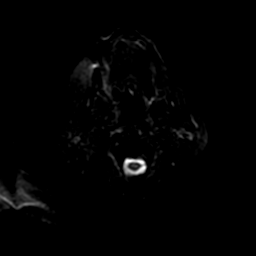
[im 13/50]
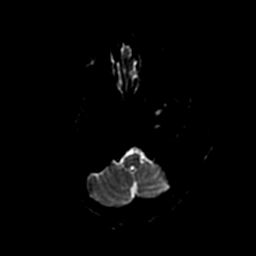
[im 25/50]
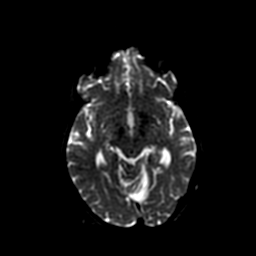
[im 37/50]
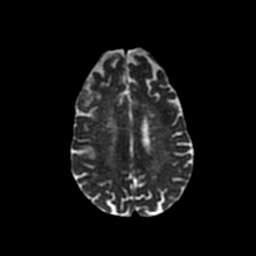
[im 50/50]
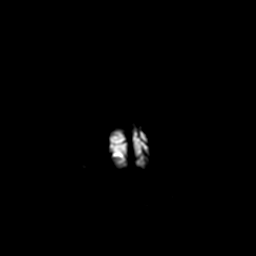

[Series 5: T2 · axial · 5.0mm · 0.47mm/px · z∈[-86,+56]mm · 2 of 25 slices shown (1 of 2)]
[im 1/25]
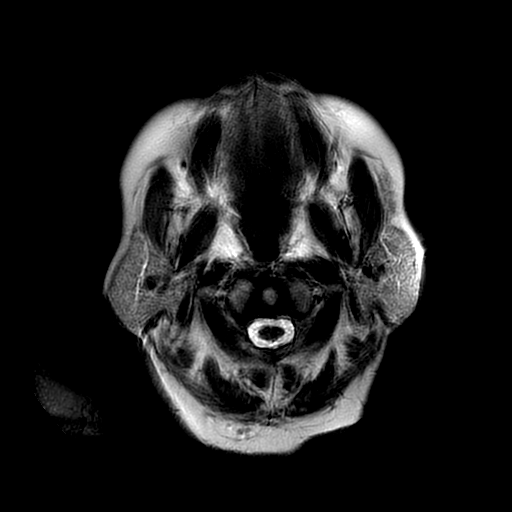
[im 25/25]
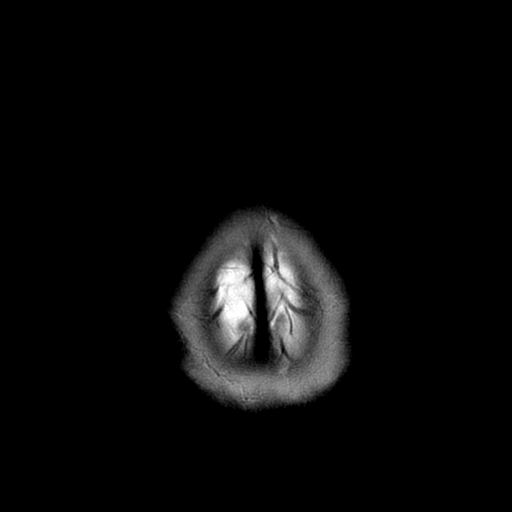

[Series 6: DWI · coronal · 4.0mm · 0.94mm/px · 6 of 70 slices shown (2 of 3)]
[im 1/70]
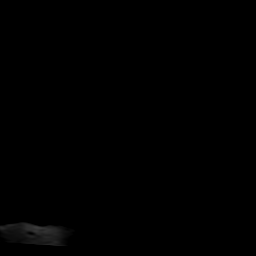
[im 14/70]
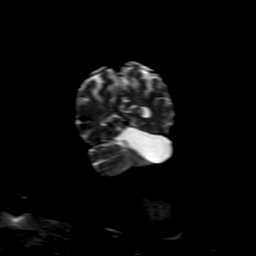
[im 28/70]
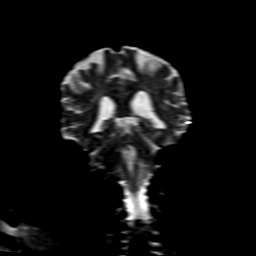
[im 42/70]
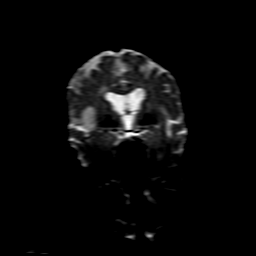
[im 56/70]
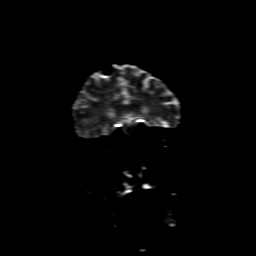
[im 70/70]
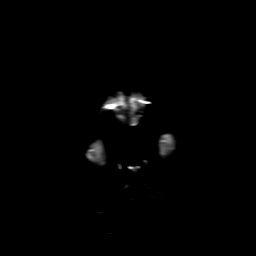

[Series 7: FLAIR · axial · 5.0mm · 0.47mm/px · z∈[-86,+56]mm · 2 of 25 slices shown (1 of 2)]
[im 1/25]
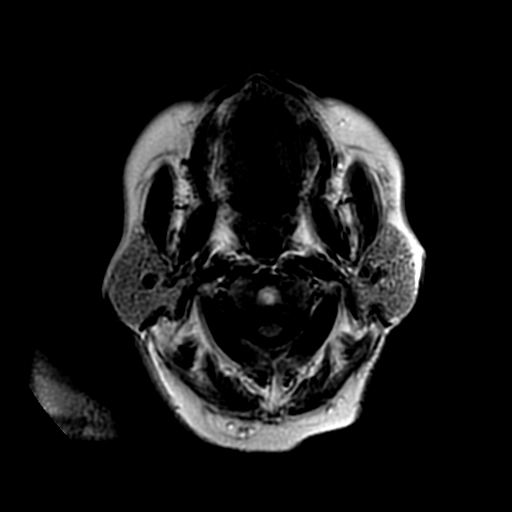
[im 25/25]
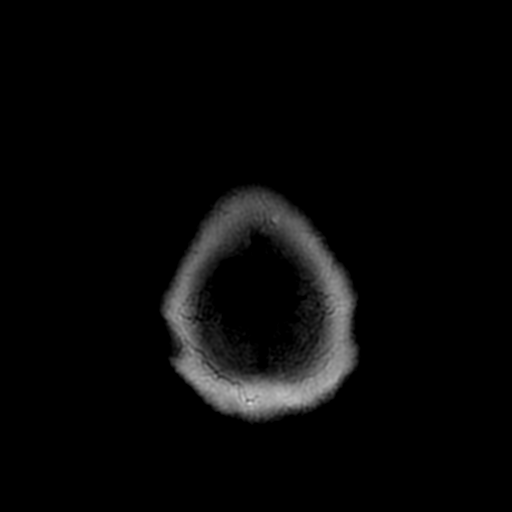

[Series 8: DWI · axial · 3.0mm · 0.94mm/px · z∈[-73,+58]mm · 8 of 89 slices shown (3 of 3)]
[im 1/89]
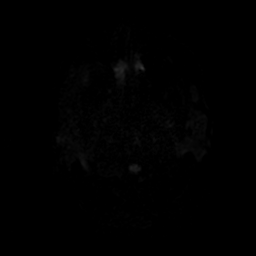
[im 13/89]
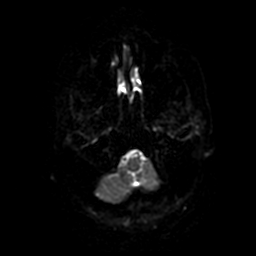
[im 26/89]
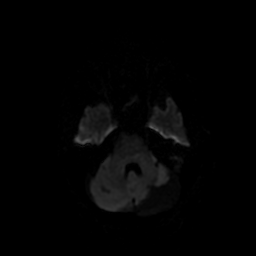
[im 38/89]
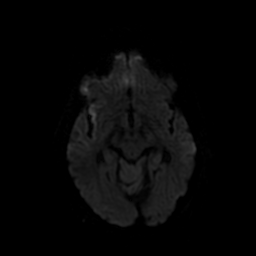
[im 51/89]
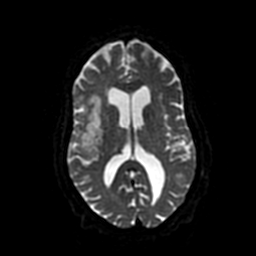
[im 63/89]
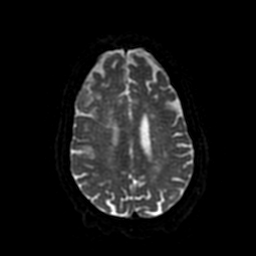
[im 76/89]
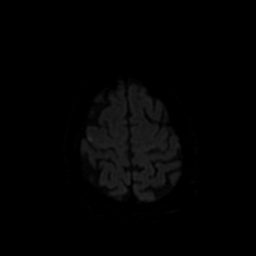
[im 89/89]
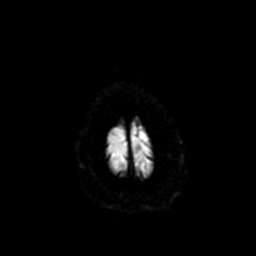

[Series 10: T2 · coronal · 5.0mm · 0.47mm/px · 3 of 29 slices shown (2 of 2)]
[im 1/29]
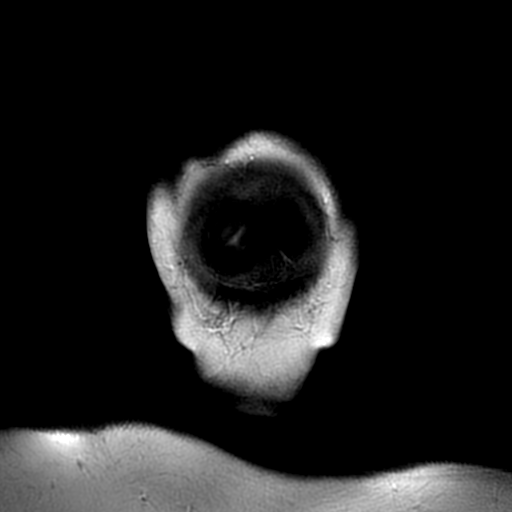
[im 15/29]
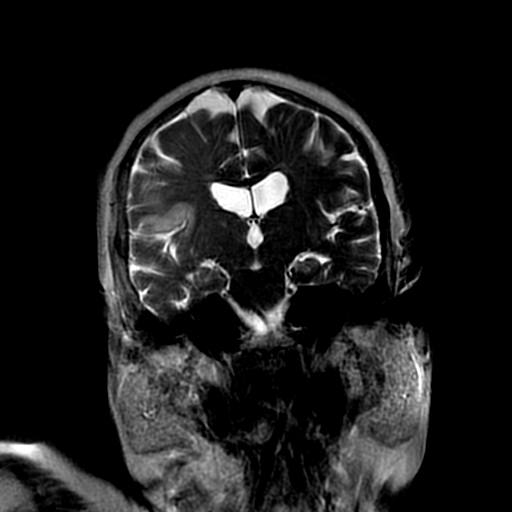
[im 29/29]
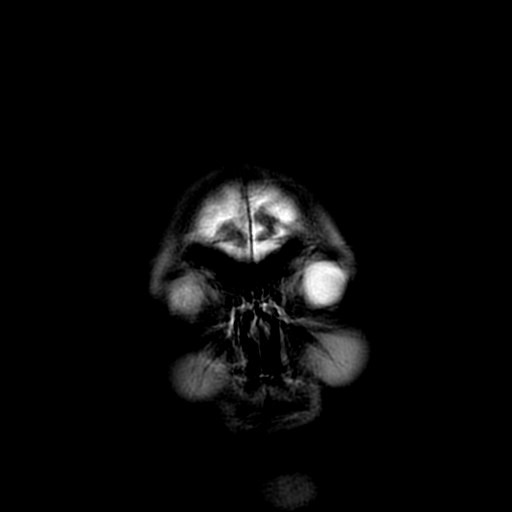

[Series 11: ax 3(person_name) · axial · 3.0mm · 0.94mm/px · z∈[-85,+55]mm · 4 of 48 slices shown]
[im 1/48]
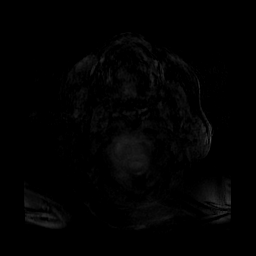
[im 16/48]
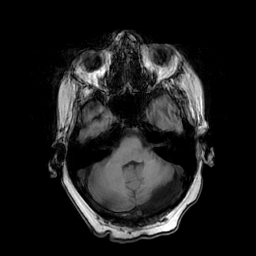
[im 32/48]
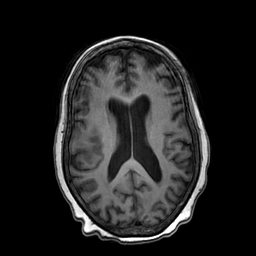
[im 48/48]
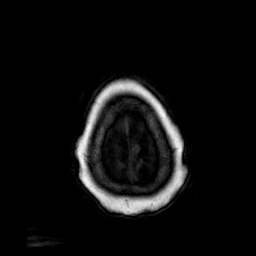

[Series 13: FLAIR · sagittal · 5.0mm · 0.47mm/px · 2 of 21 slices shown (2 of 2)]
[im 1/21]
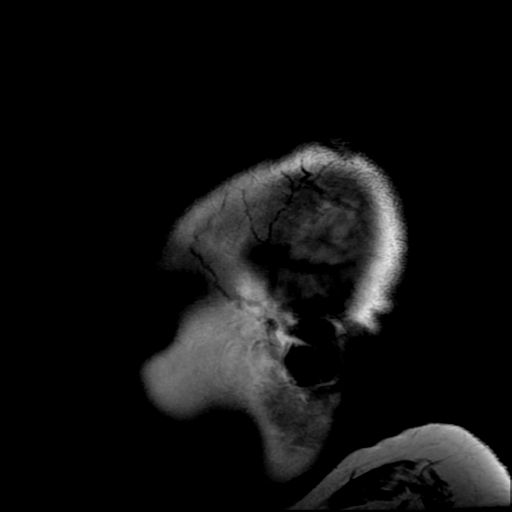
[im 21/21]
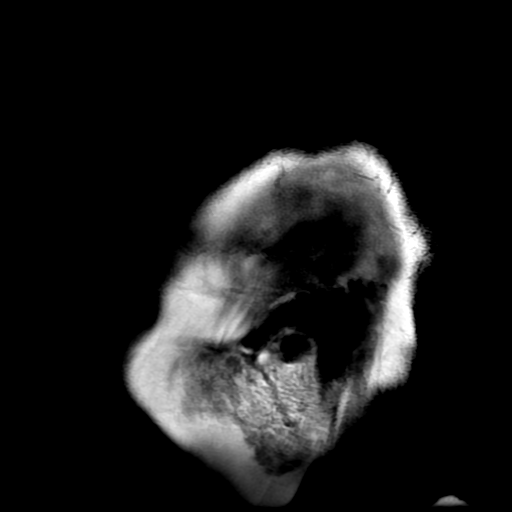

[Series 650: ADC · coronal · 4.0mm · 0.94mm/px · 3 of 35 slices shown (1 of 2)]
[im 1/35]
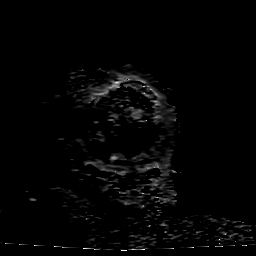
[im 18/35]
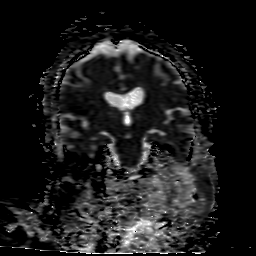
[im 35/35]
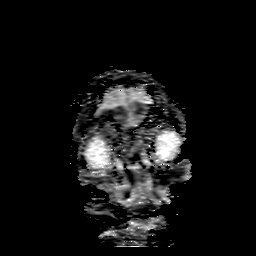

[Series 850: ADC · axial · 3.0mm · 0.94mm/px · z∈[-73,+58]mm · 4 of 45 slices shown (2 of 2)]
[im 1/45]
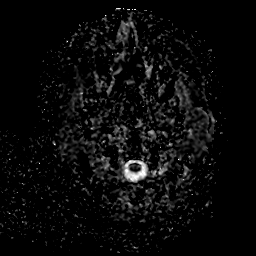
[im 15/45]
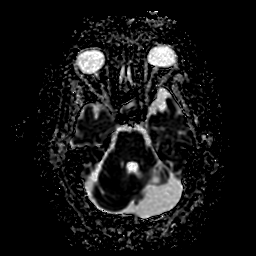
[im 30/45]
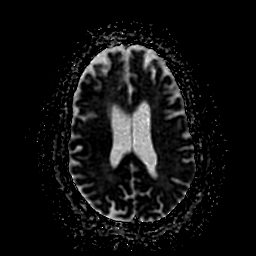
[im 45/45]
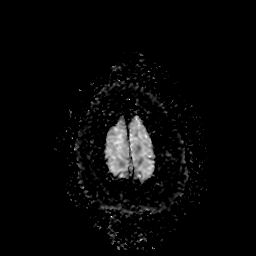

[39 of 48 positions shown; findings below may reference images not displayed]

FINDINGS: Major intracranial vascular flow voids are stable since 02/01/2016.
There is gyriform restricted diffusion along the right insula and
operculum (series 8, image 25). This tracks cephalad, with mild
superior frontal gyral involvement. Associated T2 and FLAIR
hyperintensity with scattered petechial hemorrhage (series 12, image
64). No associated mass effect.

No right basal ganglia involvement. No other lobar involvement in
the right hemisphere. No contralateral left hemisphere or posterior
fossa restricted diffusion.

Chronic left cerebellar encephalomalacia. Scattered bilateral
cerebral white matter T2 and FLAIR hyperintensity. No
ventriculomegaly. No midline shift or intracranial mass lesion. No
other acute intracranial hemorrhage. No other acute signal
abnormality in the brain. Negative pituitary, cervicomedullary
junction and visualized cervical spine. Normal bone marrow signal.
Visible internal auditory structures appear normal. Stable and well
pneumatized visualized paranasal sinuses and mastoids. Stable orbit
and scalp soft tissues.
IMPRESSION: 1. Acute right MCA infarct, mild to moderate in size with gyral
involvement centered at the right insula and operculum.
2. Positive Petechial hemorrhage, but no mass effect or malignant
hemorrhagic transformation.
3. Study discussed by telephone with Dr. RHOEDINS KEKHAN on
4. No other acute intracranial abnormality.

## 2017-03-31 IMAGING — CT CT ANGIO CHEST
2 of 8 series · 18 of 36 positions shown · IV contrast (isovue)
Comparison: Chest x-ray 01/31/2016

CLINICAL DATA: Pulmonary embolism.  Short of breath

EXAM:
CT ANGIOGRAPHY CHEST WITH CONTRAST
TECHNIQUE: Multidetector CT imaging of the chest was performed using the
standard protocol during bolus administration of intravenous
contrast. Multiplanar CT image reconstructions and MIPs were
obtained to evaluate the vascular anatomy.
CONTRAST:  100 mL Isovue 370 IV

[Series 406: thins pacs · axial · 0.68mm/px · z∈[-633,-393]mm · 17 of 270 slices shown]
[im 15/270  lung]
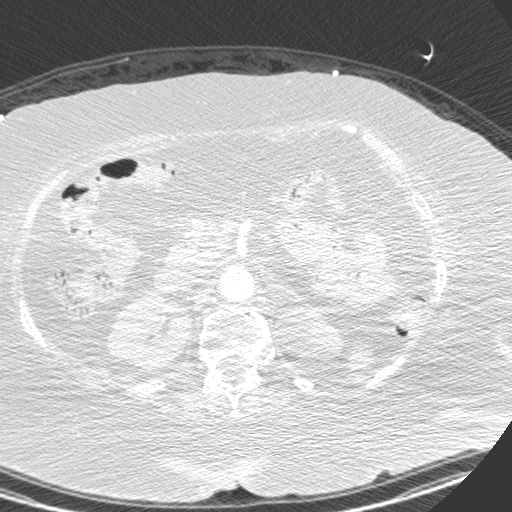
[im 30/270  mediastinal]
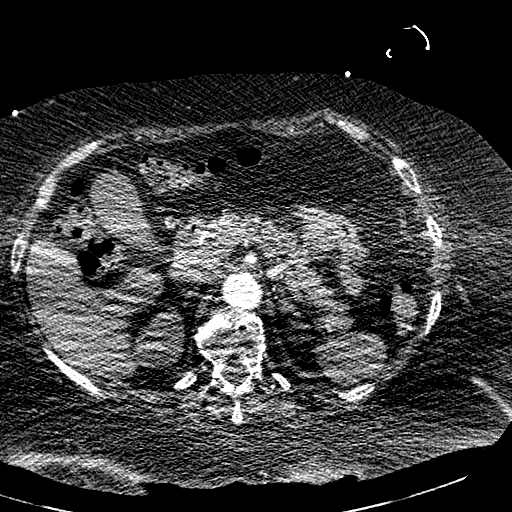
[im 45/270  lung]
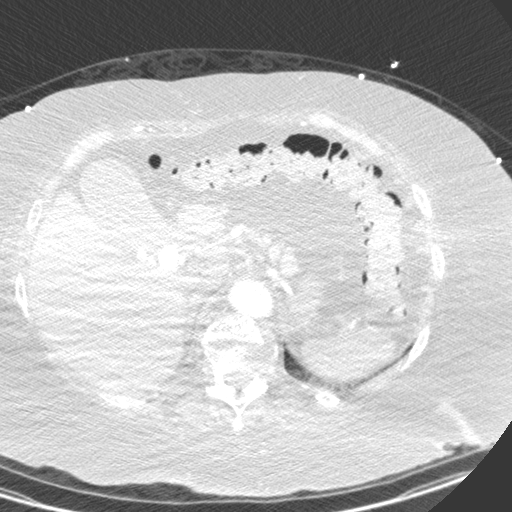
[im 60/270  mediastinal]
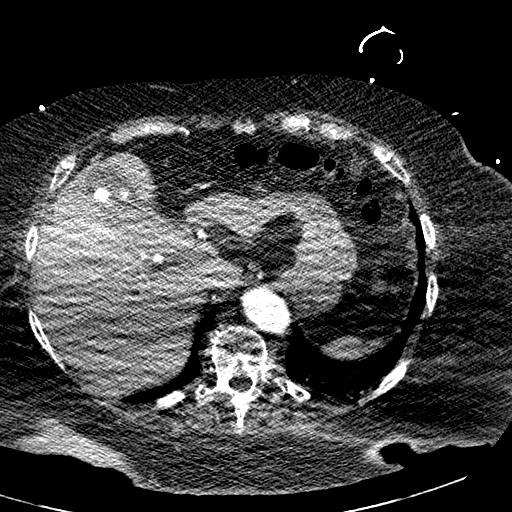
[im 75/270  lung]
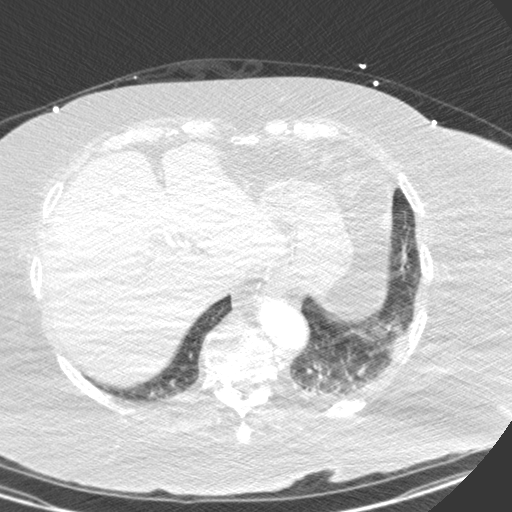
[im 90/270  mediastinal]
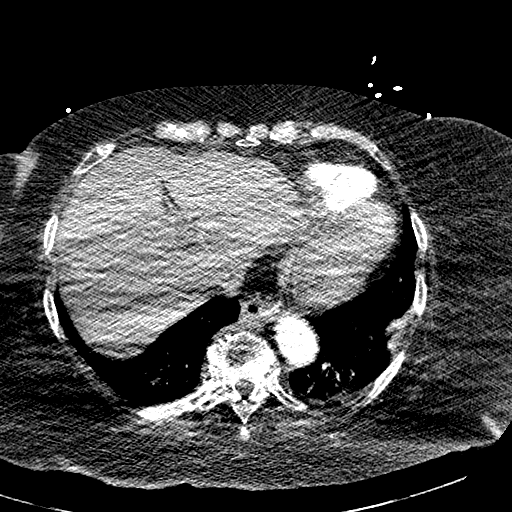
[im 105/270  lung]
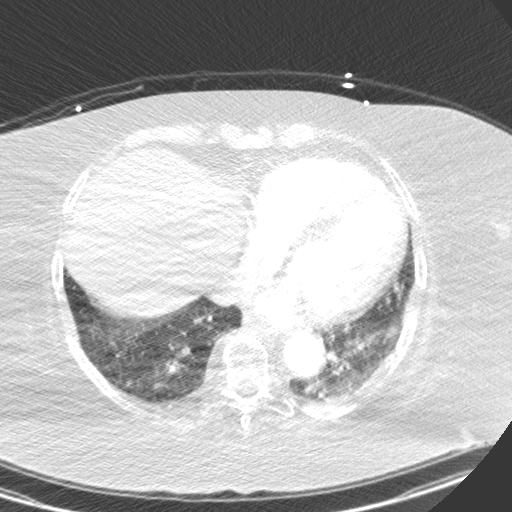
[im 120/270  mediastinal]
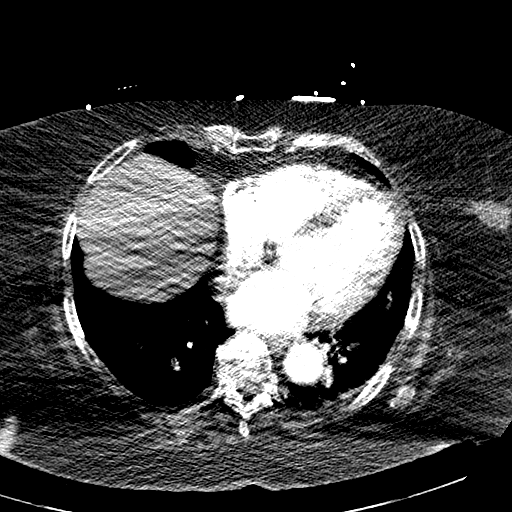
[im 135/270  lung]
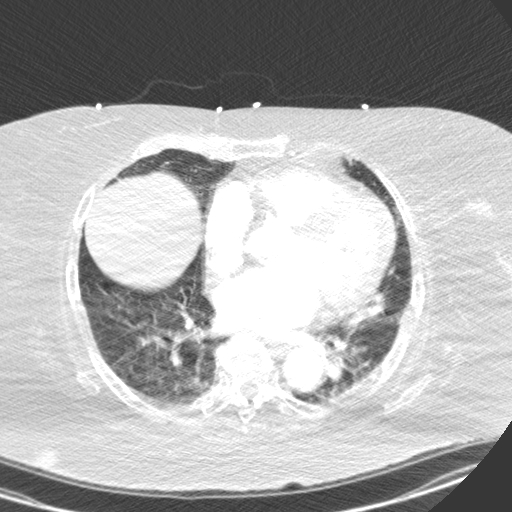
[im 150/270  mediastinal]
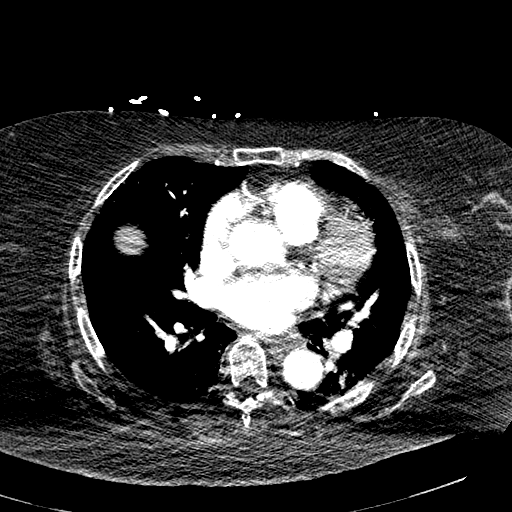
[im 165/270  lung]
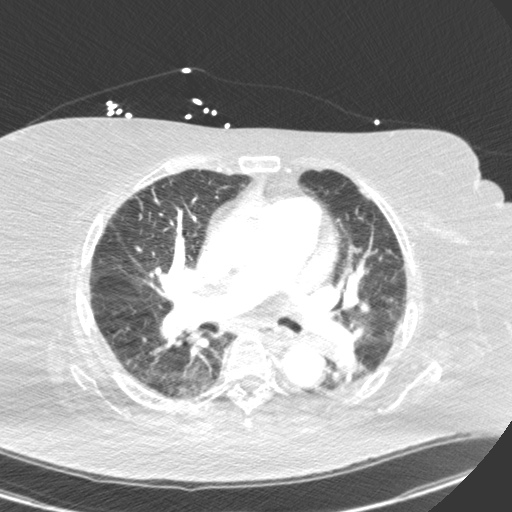
[im 180/270  mediastinal]
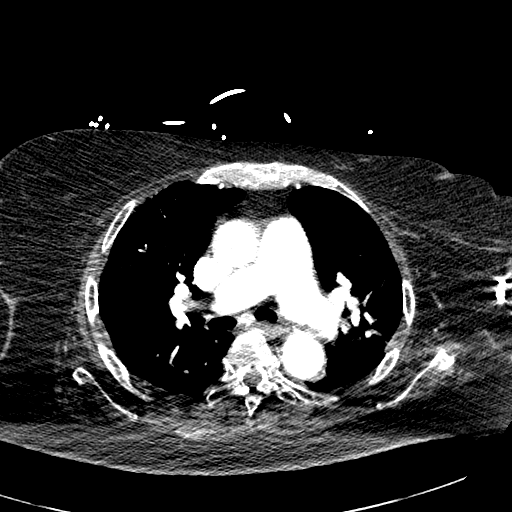
[im 195/270  lung]
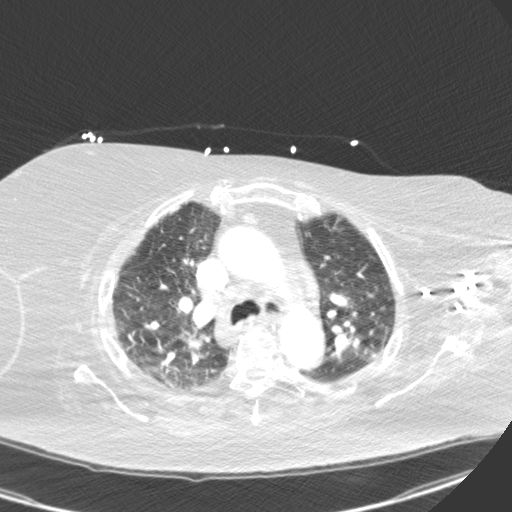
[im 210/270  mediastinal]
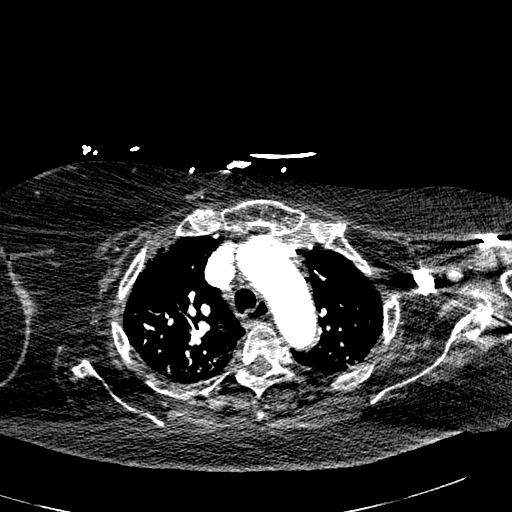
[im 225/270  lung]
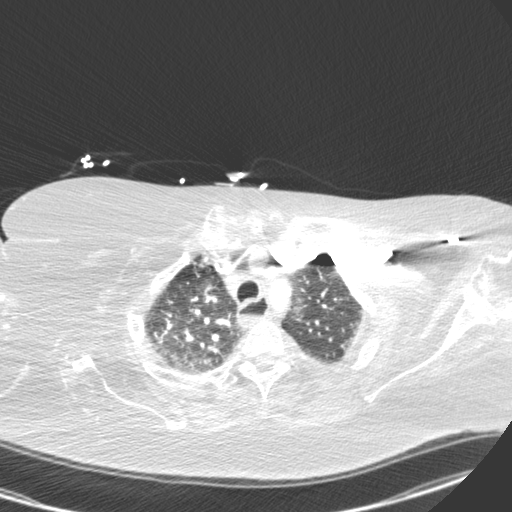
[im 240/270  mediastinal]
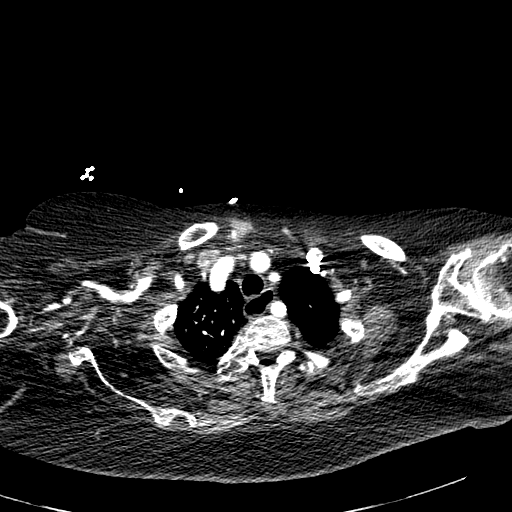
[im 255/270  lung]
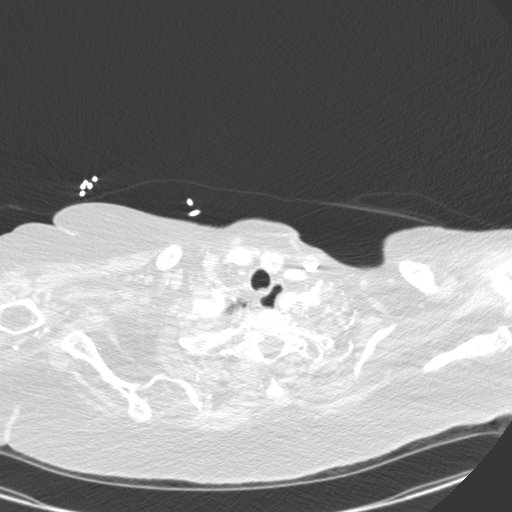

[Series 409: coronal mpr · coronal · 0.68mm/px · 1 of 102 slices shown]
[im 51/102  mediastinal]
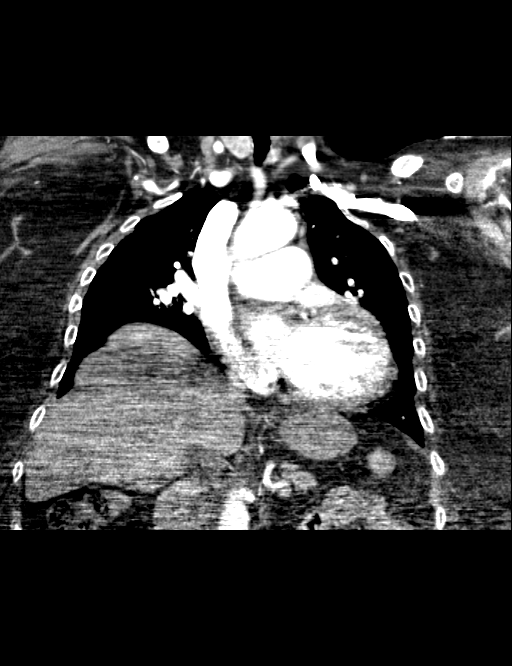

[18 of 36 positions shown; findings below may reference images not displayed]

FINDINGS: Image quality is suboptimal due to large patient size and patient
motion during the scan. Distal pulmonary vessels are poorly
visualized due to motion.

Intraluminal filling defects in the main pulmonary arteries
bilaterally. These appear adherent to the wall and are consistent
with pulmonary emboli. Possible subacute emboli. No change from CTA
neck 03/07/2016. There may be some embolus on in and left lower lobe
pulmonary artery, not well evaluated due to motion. Pulmonary
arteries are diffusely dilated suggesting pulmonary artery
hypertension. RV/LV ratio

Negative for aortic dissection or aneurysm. Mild atherosclerotic
calcification in the aorta. Cardiac enlargement without pericardial
effusion.

Mild apical scarring on the right. 15 x 30 mm subpleural density in
the left lower lobe laterally, possibly atelectasis. Follow-up
recommended. No pleural effusion

Negative for mass or adenopathy.  No definite pneumonia.

Multiple gallstones which are calcified. Left upper pole renal cyst
measuring 2.5 cm.

Review of the MIP images confirms the above findings.
IMPRESSION: Bilateral pulmonary artery. These appeared adherent to the main
pulmonary artery and may be subacute pulmonary emboli. There is
pulmonary artery hypertension. Cardiac enlargement.

15 x 30 mm subpleural density left lower lobe may represent
atelectasis or mass lesion. Follow-up chest CT recommended. Initial
follow-up by chest CT without contrast is recommended in 3 months to
confirm persistence. This recommendation follows the consensus
statement: Recommendations for the Management of Subsolid Pulmonary
Nodules Detected at CT: A Statement from the [HOSPITAL] as
published in Radiology 3667; [DATE].

Cholelithiasis

These results were called by telephone at the time of interpretation
on 03/08/2016 at [DATE] to Dr. SANVER SI , who verbally
acknowledged these results.

## 2019-10-02 ENCOUNTER — Inpatient Hospital Stay (HOSPITAL_COMMUNITY)
Admission: EM | Admit: 2019-10-02 | Discharge: 2019-10-09 | DRG: 871 | Disposition: A | Discharge status: Hospice | Payer: Medicare Other | Attending: Internal Medicine | Admitting: Internal Medicine

## 2019-10-02 ENCOUNTER — Emergency Department (HOSPITAL_COMMUNITY): Payer: Medicare Other

## 2019-10-02 ENCOUNTER — Inpatient Hospital Stay (HOSPITAL_COMMUNITY): Payer: Medicare Other

## 2019-10-02 ENCOUNTER — Encounter (HOSPITAL_COMMUNITY): Payer: Self-pay

## 2019-10-02 ENCOUNTER — Other Ambulatory Visit: Payer: Self-pay

## 2019-10-02 DIAGNOSIS — R34 Anuria and oliguria: Secondary | ICD-10-CM | POA: Diagnosis not present

## 2019-10-02 DIAGNOSIS — E559 Vitamin D deficiency, unspecified: Secondary | ICD-10-CM | POA: Diagnosis present

## 2019-10-02 DIAGNOSIS — M1611 Unilateral primary osteoarthritis, right hip: Secondary | ICD-10-CM | POA: Diagnosis present

## 2019-10-02 DIAGNOSIS — F419 Anxiety disorder, unspecified: Secondary | ICD-10-CM | POA: Diagnosis present

## 2019-10-02 DIAGNOSIS — Z6831 Body mass index (BMI) 31.0-31.9, adult: Secondary | ICD-10-CM

## 2019-10-02 DIAGNOSIS — R1310 Dysphagia, unspecified: Secondary | ICD-10-CM | POA: Diagnosis present

## 2019-10-02 DIAGNOSIS — I444 Left anterior fascicular block: Secondary | ICD-10-CM | POA: Diagnosis present

## 2019-10-02 DIAGNOSIS — E861 Hypovolemia: Secondary | ICD-10-CM | POA: Diagnosis not present

## 2019-10-02 DIAGNOSIS — R6521 Severe sepsis with septic shock: Secondary | ICD-10-CM | POA: Diagnosis present

## 2019-10-02 DIAGNOSIS — N390 Urinary tract infection, site not specified: Secondary | ICD-10-CM | POA: Diagnosis not present

## 2019-10-02 DIAGNOSIS — J189 Pneumonia, unspecified organism: Secondary | ICD-10-CM

## 2019-10-02 DIAGNOSIS — Z87440 Personal history of urinary (tract) infections: Secondary | ICD-10-CM | POA: Diagnosis not present

## 2019-10-02 DIAGNOSIS — I361 Nonrheumatic tricuspid (valve) insufficiency: Secondary | ICD-10-CM | POA: Diagnosis not present

## 2019-10-02 DIAGNOSIS — Z91040 Latex allergy status: Secondary | ICD-10-CM

## 2019-10-02 DIAGNOSIS — Z86711 Personal history of pulmonary embolism: Secondary | ICD-10-CM | POA: Diagnosis not present

## 2019-10-02 DIAGNOSIS — E785 Hyperlipidemia, unspecified: Secondary | ICD-10-CM | POA: Diagnosis present

## 2019-10-02 DIAGNOSIS — K219 Gastro-esophageal reflux disease without esophagitis: Secondary | ICD-10-CM | POA: Diagnosis present

## 2019-10-02 DIAGNOSIS — I5022 Chronic systolic (congestive) heart failure: Secondary | ICD-10-CM

## 2019-10-02 DIAGNOSIS — Z8673 Personal history of transient ischemic attack (TIA), and cerebral infarction without residual deficits: Secondary | ICD-10-CM

## 2019-10-02 DIAGNOSIS — Z872 Personal history of diseases of the skin and subcutaneous tissue: Secondary | ICD-10-CM | POA: Diagnosis not present

## 2019-10-02 DIAGNOSIS — E870 Hyperosmolality and hypernatremia: Secondary | ICD-10-CM | POA: Diagnosis present

## 2019-10-02 DIAGNOSIS — I110 Hypertensive heart disease with heart failure: Secondary | ICD-10-CM | POA: Diagnosis present

## 2019-10-02 DIAGNOSIS — Z853 Personal history of malignant neoplasm of breast: Secondary | ICD-10-CM | POA: Diagnosis not present

## 2019-10-02 DIAGNOSIS — D6489 Other specified anemias: Secondary | ICD-10-CM | POA: Diagnosis present

## 2019-10-02 DIAGNOSIS — E860 Dehydration: Secondary | ICD-10-CM | POA: Diagnosis present

## 2019-10-02 DIAGNOSIS — A4152 Sepsis due to Pseudomonas: Secondary | ICD-10-CM | POA: Diagnosis not present

## 2019-10-02 DIAGNOSIS — Z20822 Contact with and (suspected) exposure to covid-19: Secondary | ICD-10-CM | POA: Diagnosis not present

## 2019-10-02 DIAGNOSIS — R4182 Altered mental status, unspecified: Secondary | ICD-10-CM | POA: Diagnosis present

## 2019-10-02 DIAGNOSIS — E876 Hypokalemia: Secondary | ICD-10-CM | POA: Diagnosis not present

## 2019-10-02 DIAGNOSIS — I5023 Acute on chronic systolic (congestive) heart failure: Secondary | ICD-10-CM | POA: Diagnosis not present

## 2019-10-02 DIAGNOSIS — M19019 Primary osteoarthritis, unspecified shoulder: Secondary | ICD-10-CM | POA: Diagnosis present

## 2019-10-02 DIAGNOSIS — J155 Pneumonia due to Escherichia coli: Secondary | ICD-10-CM | POA: Diagnosis not present

## 2019-10-02 DIAGNOSIS — Z66 Do not resuscitate: Secondary | ICD-10-CM | POA: Diagnosis not present

## 2019-10-02 DIAGNOSIS — Z7901 Long term (current) use of anticoagulants: Secondary | ICD-10-CM | POA: Diagnosis not present

## 2019-10-02 DIAGNOSIS — Y95 Nosocomial condition: Secondary | ICD-10-CM | POA: Diagnosis not present

## 2019-10-02 DIAGNOSIS — G9341 Metabolic encephalopathy: Secondary | ICD-10-CM | POA: Diagnosis not present

## 2019-10-02 DIAGNOSIS — Z9189 Other specified personal risk factors, not elsewhere classified: Secondary | ICD-10-CM

## 2019-10-02 DIAGNOSIS — Z7189 Other specified counseling: Secondary | ICD-10-CM

## 2019-10-02 DIAGNOSIS — Z8619 Personal history of other infectious and parasitic diseases: Secondary | ICD-10-CM

## 2019-10-02 DIAGNOSIS — C50919 Malignant neoplasm of unspecified site of unspecified female breast: Secondary | ICD-10-CM | POA: Diagnosis present

## 2019-10-02 DIAGNOSIS — Z7401 Bed confinement status: Secondary | ICD-10-CM | POA: Diagnosis not present

## 2019-10-02 DIAGNOSIS — I69354 Hemiplegia and hemiparesis following cerebral infarction affecting left non-dominant side: Secondary | ICD-10-CM

## 2019-10-02 DIAGNOSIS — Z79899 Other long term (current) drug therapy: Secondary | ICD-10-CM

## 2019-10-02 DIAGNOSIS — Z515 Encounter for palliative care: Secondary | ICD-10-CM | POA: Diagnosis not present

## 2019-10-02 DIAGNOSIS — A419 Sepsis, unspecified organism: Secondary | ICD-10-CM | POA: Diagnosis not present

## 2019-10-02 DIAGNOSIS — R4589 Other symptoms and signs involving emotional state: Secondary | ICD-10-CM

## 2019-10-02 DIAGNOSIS — I5020 Unspecified systolic (congestive) heart failure: Secondary | ICD-10-CM | POA: Diagnosis not present

## 2019-10-02 DIAGNOSIS — G9340 Encephalopathy, unspecified: Secondary | ICD-10-CM | POA: Diagnosis present

## 2019-10-02 DIAGNOSIS — A4151 Sepsis due to Escherichia coli [E. coli]: Secondary | ICD-10-CM | POA: Diagnosis not present

## 2019-10-02 DIAGNOSIS — E669 Obesity, unspecified: Secondary | ICD-10-CM | POA: Diagnosis present

## 2019-10-02 DIAGNOSIS — Z79811 Long term (current) use of aromatase inhibitors: Secondary | ICD-10-CM

## 2019-10-02 DIAGNOSIS — Z91048 Other nonmedicinal substance allergy status: Secondary | ICD-10-CM | POA: Diagnosis not present

## 2019-10-02 DIAGNOSIS — L89152 Pressure ulcer of sacral region, stage 2: Secondary | ICD-10-CM | POA: Diagnosis present

## 2019-10-02 DIAGNOSIS — D509 Iron deficiency anemia, unspecified: Secondary | ICD-10-CM | POA: Diagnosis present

## 2019-10-02 DIAGNOSIS — R52 Pain, unspecified: Secondary | ICD-10-CM

## 2019-10-02 DIAGNOSIS — R410 Disorientation, unspecified: Secondary | ICD-10-CM

## 2019-10-02 DIAGNOSIS — B965 Pseudomonas (aeruginosa) (mallei) (pseudomallei) as the cause of diseases classified elsewhere: Secondary | ICD-10-CM | POA: Diagnosis not present

## 2019-10-02 DIAGNOSIS — J302 Other seasonal allergic rhinitis: Secondary | ICD-10-CM | POA: Diagnosis present

## 2019-10-02 DIAGNOSIS — R6520 Severe sepsis without septic shock: Secondary | ICD-10-CM | POA: Diagnosis not present

## 2019-10-02 DIAGNOSIS — L98429 Non-pressure chronic ulcer of back with unspecified severity: Secondary | ICD-10-CM | POA: Diagnosis present

## 2019-10-02 DIAGNOSIS — M069 Rheumatoid arthritis, unspecified: Secondary | ICD-10-CM | POA: Diagnosis present

## 2019-10-02 LAB — COMPREHENSIVE METABOLIC PANEL
ALT: 29 U/L (ref 0–44)
AST: 35 U/L (ref 15–41)
Albumin: 2.2 g/dL — ABNORMAL LOW (ref 3.5–5.0)
Alkaline Phosphatase: 217 U/L — ABNORMAL HIGH (ref 38–126)
Anion gap: 12 (ref 5–15)
BUN: 19 mg/dL (ref 8–23)
CO2: 24 mmol/L (ref 22–32)
Calcium: 9.7 mg/dL (ref 8.9–10.3)
Chloride: 112 mmol/L — ABNORMAL HIGH (ref 98–111)
Creatinine, Ser: 0.59 mg/dL (ref 0.44–1.00)
GFR calc Af Amer: >60 mL/min (ref >60)
GFR calc non Af Amer: >60 mL/min (ref >60)
Glucose, Bld: 93 mg/dL (ref 70–99)
Potassium: 4.1 mmol/L (ref 3.5–5.1)
Sodium: 148 mmol/L — ABNORMAL HIGH (ref 135–145)
Total Bilirubin: 0.7 mg/dL (ref 0.3–1.2)
Total Protein: 7.8 g/dL (ref 6.5–8.1)

## 2019-10-02 LAB — URINALYSIS, ROUTINE W REFLEX MICROSCOPIC
Glucose, UA: 100 mg/dL — AB
Ketones, ur: 15 mg/dL — AB
Nitrite: POSITIVE — AB
Protein, ur: >300 mg/dL — AB
Specific Gravity, Urine: 1.02 (ref 1.005–1.030)
pH: 6.5 (ref 5.0–8.0)

## 2019-10-02 LAB — LACTIC ACID, PLASMA
Lactic Acid, Venous: 2 mmol/L (ref 0.5–1.9)
Lactic Acid, Venous: 1.5 mmol/L (ref 0.5–1.9)

## 2019-10-02 LAB — ECHOCARDIOGRAM COMPLETE
Height: 63 in
Weight: 3360 oz

## 2019-10-02 LAB — RESPIRATORY PANEL BY RT PCR (FLU A&B, COVID)
Influenza A by PCR: NEGATIVE
Influenza B by PCR: NEGATIVE
SARS Coronavirus 2 by RT PCR: NEGATIVE

## 2019-10-02 LAB — URINALYSIS, MICROSCOPIC (REFLEX)
RBC / HPF: >50 RBC/hpf (ref 0–5)
WBC, UA: >50 WBC/hpf (ref 0–5)

## 2019-10-02 LAB — CBC WITH DIFFERENTIAL/PLATELET
Abs Immature Granulocytes: 0.07 10*3/uL (ref 0.00–0.07)
Basophils Absolute: 0.1 10*3/uL (ref 0.0–0.1)
Basophils Relative: 0 %
Eosinophils Absolute: 0.1 10*3/uL (ref 0.0–0.5)
Eosinophils Relative: 1 %
HCT: 47.9 % — ABNORMAL HIGH (ref 36.0–46.0)
Hemoglobin: 14.4 g/dL (ref 12.0–15.0)
Immature Granulocytes: 0 %
Lymphocytes Relative: 8 %
Lymphs Abs: 1.3 10*3/uL (ref 0.7–4.0)
MCH: 28.3 pg (ref 26.0–34.0)
MCHC: 30.1 g/dL (ref 30.0–36.0)
MCV: 94.1 fL (ref 80.0–100.0)
Monocytes Absolute: 1 10*3/uL (ref 0.1–1.0)
Monocytes Relative: 6 %
Neutro Abs: 14.8 10*3/uL — ABNORMAL HIGH (ref 1.7–7.7)
Neutrophils Relative %: 85 %
Platelets: 192 10*3/uL (ref 150–400)
RBC: 5.09 MIL/uL (ref 3.87–5.11)
RDW: 18.2 % — ABNORMAL HIGH (ref 11.5–15.5)
WBC: 17.3 10*3/uL — ABNORMAL HIGH (ref 4.0–10.5)
nRBC: 0.2 % (ref 0.0–0.2)

## 2019-10-02 LAB — TROPONIN I (HIGH SENSITIVITY): Troponin I (High Sensitivity): 10 ng/L (ref <18)

## 2019-10-02 LAB — EXPECTORATED SPUTUM ASSESSMENT W GRAM STAIN, RFLX TO RESP C

## 2019-10-02 LAB — BRAIN NATRIURETIC PEPTIDE: B Natriuretic Peptide: 164.9 pg/mL — ABNORMAL HIGH (ref 0.0–100.0)

## 2019-10-02 LAB — CBG MONITORING, ED: Glucose-Capillary: 75 mg/dL (ref 70–99)

## 2019-10-02 MED ORDER — SODIUM CHLORIDE 0.9% FLUSH
3.0000 mL | Freq: Two times a day (BID) | INTRAVENOUS | Status: DC
  Administered 2019-10-02 – 2019-10-03 (×2): 3 mL via INTRAVENOUS

## 2019-10-02 MED ORDER — APIXABAN 5 MG PO TABS
5.0000 mg | ORAL_TABLET | Freq: Two times a day (BID) | ORAL | Status: DC
  Administered 2019-10-02 – 2019-10-06 (×10): 5 mg via ORAL
  Filled 2019-10-02 (×11): qty 1

## 2019-10-02 MED ORDER — SODIUM CHLORIDE 0.9 % IV BOLUS
500.0000 mL | Freq: Once | INTRAVENOUS | Status: AC
  Administered 2019-10-02: 500 mL via INTRAVENOUS

## 2019-10-02 MED ORDER — LACTATED RINGERS IV BOLUS
1000.0000 mL | Freq: Once | INTRAVENOUS | Status: AC
  Administered 2019-10-02: 1000 mL via INTRAVENOUS

## 2019-10-02 MED ORDER — SODIUM CHLORIDE 0.9 % IV SOLN
1.0000 g | Freq: Once | INTRAVENOUS | Status: AC
  Administered 2019-10-02: 1 g via INTRAVENOUS
  Filled 2019-10-02: qty 10

## 2019-10-02 MED ORDER — SODIUM CHLORIDE 0.9 % IV SOLN
1.0000 g | INTRAVENOUS | Status: DC
  Filled 2019-10-02: qty 10

## 2019-10-02 MED ORDER — SODIUM CHLORIDE 0.9% FLUSH: 10.0000 mL | INTRAVENOUS | Status: DC | PRN

## 2019-10-02 MED ORDER — PANTOPRAZOLE SODIUM 40 MG PO TBEC
40.0000 mg | DELAYED_RELEASE_TABLET | Freq: Every day | ORAL | Status: DC
  Administered 2019-10-02 – 2019-10-06 (×4): 40 mg via ORAL
  Filled 2019-10-02 (×4): qty 1

## 2019-10-02 MED ORDER — AQUAPHOR ADVANCED THERAPY EX OINT: TOPICAL_OINTMENT | CUTANEOUS | Status: DC | PRN

## 2019-10-02 MED ORDER — PRAVASTATIN SODIUM 10 MG PO TABS
20.0000 mg | ORAL_TABLET | Freq: Every day | ORAL | Status: DC
  Administered 2019-10-02 – 2019-10-05 (×2): 20 mg via ORAL
  Filled 2019-10-02 (×3): qty 2

## 2019-10-02 MED ORDER — SODIUM CHLORIDE 0.9 % IV BOLUS
250.0000 mL | Freq: Once | INTRAVENOUS | Status: AC
  Administered 2019-10-02: 250 mL via INTRAVENOUS

## 2019-10-02 MED ORDER — SODIUM CHLORIDE 0.9 % IV SOLN
500.0000 mg | Freq: Once | INTRAVENOUS | Status: AC
  Administered 2019-10-02: 500 mg via INTRAVENOUS
  Filled 2019-10-02: qty 500

## 2019-10-02 MED ORDER — ALBUTEROL SULFATE (2.5 MG/3ML) 0.083% IN NEBU: 3.0000 mL | INHALATION_SOLUTION | Freq: Three times a day (TID) | RESPIRATORY_TRACT | Status: DC | PRN

## 2019-10-02 MED ORDER — ACETAMINOPHEN 325 MG PO TABS: 650.0000 mg | ORAL_TABLET | Freq: Four times a day (QID) | ORAL | Status: DC | PRN

## 2019-10-02 MED ORDER — ACETAMINOPHEN 650 MG RE SUPP: 650.0000 mg | Freq: Four times a day (QID) | RECTAL | Status: DC | PRN

## 2019-10-02 MED ORDER — SODIUM CHLORIDE 0.9% FLUSH
10.0000 mL | Freq: Two times a day (BID) | INTRAVENOUS | Status: DC
  Administered 2019-10-02 – 2019-10-03 (×3): 10 mL
  Administered 2019-10-04: 22:00:00 20 mL
  Administered 2019-10-06 – 2019-10-09 (×3): 10 mL

## 2019-10-02 MED ORDER — ZINC OXIDE 11.3 % EX CREA
1.0000 "application " | TOPICAL_CREAM | Freq: Every day | CUTANEOUS | Status: DC | PRN
  Filled 2019-10-02 (×2): qty 56

## 2019-10-02 NOTE — ED Provider Notes (Signed)
[facility] EMERGENCY DEPARTMENT Provider Note   CSN: [id number] Arrival date & time: 10/02/19  [id number]     History No chief complaint on file.   Sharon Velasquez is a 86 y.o. female.  86 year old female with prior medical history as detailed below presents via EMS transport for evaluation of altered mental status.  Patient resides at home with family.  Family has noticed that over the last 4 to 5 days gradually worsening confusion.  Patient symptoms have been waxing and waning.  Patient's daughter notices increased confusion intermittently throughout the day.  Today her symptoms were worse.  Upon arrival to the ED she is at her normal mental status.  She is alert and oriented x4.  She has a prior history of stroke with left-sided deficit.  Speech today per EMS is normal.  Patient is currently without significant complaint.  EMS reports that the patient's family was advised to have the patient evaluated in the ED by her primary care provider if symptoms worsen.  The history is provided by the patient, medical records and the EMS personnel.  Illness Location:  AMS Severity:  Mild Onset quality:  Gradual Duration:  4 days Timing:  Intermittent Progression:  Waxing and waning Chronicity:  New Associated symptoms: cough   Associated symptoms: no fever and no shortness of breath        Past Medical History:  Diagnosis Date  . Acute renal insufficiency   . Adenocarcinoma of breast ([place])   . Aromatase inhibitor use   . Atopic dermatitis   . Cancer (HCC)    breast  . Cellulitis   . Debilitated patient   . Dysphagia   . Elevated blood pressure reading without diagnosis of hypertension   . Encounter for preventive health examination   . Esophageal reflux   . GERD (gastroesophageal reflux disease)   . Glossitis   . Hypertension   . Increased urinary frequency   . Intertrigo   . Joint pain   . Localized osteoarthritis of shoulder   . Lump or mass in breast     . Lymphedema   . Malignant neoplasm without specification of site ([place])   . Mammogram abnormal   . Morbid obesity ([place])   . Neuroendocrine tumor   . Neuroendocrine tumor   . Non-compliance   . Osteoarthritis   . Osteoarthritis   . Primary localized osteoarthritis of right hip   . Rheumatoid arthritis ([place])   . Seasonal allergies   . Subacromial bursitis   . Truncal muscle weakness     Patient Active Problem List   Diagnosis Date Noted  . 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 Chronic systolic CHF (congestive heart failure) ([place]) 03/25/2016  . Hyperlipidemia 03/25/2016  . Acute right MCA stroke ([place]) 03/07/2016  . 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 Hypotension 02/13/2016  . Iron deficiency anemia 02/13/2016  . Thrombocytopenia ([place]) 02/13/2016  . Hypothermia   . Malnutrition of moderate degree 02/02/2016  . MSSA (methicillin susceptible Staphylococcus aureus) infection 01/31/2016  . Acute encephalopathy 01/31/2016  . UTI (lower urinary tract infection) 01/30/2016  . Hypernatremia 01/30/2016  . Vitamin D deficiency 01/07/2016  . Hypertensive heart disease with CHF (congestive heart failure) ([place])   . GERD (gastroesophageal reflux disease)   . Cancer ([place])   . Seasonal allergies   . Stevens-Johnson syndrome ([place]) 12/29/2015  . Candida infection of mouth 12/28/2015  . Colonic constipation 12/28/2015  . Acidosis, hyperchloremic 12/26/2015  . Elevated WBC count 12/26/2015  .  Malignant neoplasm of female breast ([place]) 12/25/2015  . Erythematous condition 12/25/2015  . Morbid (severe) obesity due to excess calories ([place]) 12/25/2015  . Malignant neoplasm of kidney ([place]) 05/05/2014    No past surgical history on file.   OB History   No obstetric history on file.     Family History  Problem Relation Age of Onset  . Cancer Other     Social History   Tobacco Use  . Smoking status: Never Smoker  Substance Use Topics  . Alcohol use: No  . Drug  use: Not on file    Home Medications Prior to Admission medications   Medication Sig Start Date End Date Taking? Authorizing Provider  apixaban (ELIQUIS) 5 MG TABS tablet Take 1 tablet (5 mg total) by mouth 2 (two) times daily. 03/19/16   Donzetta Starch, NP  Calcium Carbonate-Vitamin D 600-200 MG-UNIT CAPS Take 1 tablet by mouth once daily    [provider]  chlorhexidine (HIBICLENS) 4 % external liquid Apply 1 application topically daily as needed.    [provider]  exemestane (AROMASIN) 25 MG tablet Take 25 mg by mouth daily.     [provider]  ferrous sulfate 325 (65 FE) MG tablet Take 325 mg by mouth daily with breakfast.     [provider]  folic acid (FOLVITE) 1 MG tablet Take 1 mg by mouth daily.     [provider]  loratadine (CLARITIN) 10 MG tablet Take 10 mg by mouth daily.     [provider]  Maltodextrin-Xanthan Gum (RESOURCE THICKENUP CLEAR) POWD Take 120 g by mouth as needed (nectar thick liquid consistnecy). 03/13/16   Donzetta Starch, NP  Omega-3 Fatty Acids (FISH OIL) 1000 MG CAPS Take 1,200 mg by mouth daily.     [provider]  pantoprazole (PROTONIX) 40 MG tablet Take 40 mg by mouth daily.    [provider]  pravastatin (PRAVACHOL) 20 MG tablet Take 1 tablet (20 mg total) by mouth daily at 6 PM. 03/13/16   Donzetta Starch, NP  Vitamin D, Cholecalciferol, 400 units CAPS Give 1 capsule by mouth daily    [provider]    Allergies    Latex and Other  Review of Systems   Review of Systems  Constitutional: Negative for fever.  Respiratory: Positive for cough. Negative for shortness of breath.   All other systems reviewed and are negative.   Physical Exam Updated Vital Signs There were no vitals taken for this visit.  Physical Exam Vitals and nursing note reviewed.  Constitutional:      General: She is not in acute distress.    Appearance: Normal appearance. She is  well-developed.  HENT:     Head: Normocephalic and atraumatic.  Eyes:     Conjunctiva/sclera: Conjunctivae normal.     Pupils: Pupils are equal, round, and reactive to light.  Cardiovascular:     Rate and Rhythm: Normal rate and regular rhythm.     Heart sounds: Normal heart sounds.  Pulmonary:     Effort: Pulmonary effort is normal. No respiratory distress.     Breath sounds: Normal breath sounds.  Abdominal:     General: There is no distension.     Palpations: Abdomen is soft.     Tenderness: There is no abdominal tenderness.  Musculoskeletal:        General: No deformity. Normal range of motion.     Cervical back: Normal range of motion and  neck supple.  Skin:    General: Skin is warm and dry.  Neurological:     Mental Status: She is alert and oriented to person, place, and time. Mental status is at baseline.     Comments: Left sided deficit from prior CVA   Normal speech  AOx4     ED Results / Procedures / Treatments   Labs (all labs ordered are listed, but only abnormal results are displayed) Labs Reviewed  COMPREHENSIVE METABOLIC PANEL - Abnormal; Notable for the following components:      Result Value   Sodium 148 (*)    Chloride 112 (*)    Albumin 2.2 (*)    Alkaline Phosphatase 217 (*)    All other components within normal limits  CBC WITH DIFFERENTIAL/PLATELET - Abnormal; Notable for the following components:   WBC 17.3 (*)    HCT 47.9 (*)    RDW 18.2 (*)    Neutro Abs 14.8 (*)    All other components within normal limits  URINALYSIS, ROUTINE W REFLEX MICROSCOPIC - Abnormal; Notable for the following components:   Color, Urine BROWN (*)    APPearance TURBID (*)    Glucose, UA 100 (*)    Hgb urine dipstick LARGE (*)    Bilirubin Urine MODERATE (*)    Ketones, ur 15 (*)    Protein, ur >300 (*)    Nitrite POSITIVE (*)    Leukocytes,Ua MODERATE (*)    All other components within normal limits  BRAIN NATRIURETIC PEPTIDE - Abnormal; Notable for the  following components:   B Natriuretic Peptide 164.9 (*)    All other components within normal limits  LACTIC ACID, PLASMA - Abnormal; Notable for the following components:   Lactic Acid, Venous 2.0 (*)    All other components within normal limits  URINALYSIS, MICROSCOPIC (REFLEX) - Abnormal; Notable for the following components:   Bacteria, UA MANY (*)    All other components within normal limits  RESPIRATORY PANEL BY RT PCR (FLU A&B, COVID)  CULTURE, BLOOD (ROUTINE X 2)  CULTURE, BLOOD (ROUTINE X 2)  EXPECTORATED SPUTUM ASSESSMENT W REFEX TO RESP CULTURE  LACTIC ACID, PLASMA  CBG MONITORING, ED  TROPONIN I (HIGH SENSITIVITY)  TROPONIN I (HIGH SENSITIVITY)    EKG EKG Interpretation  Date/Time:  Friday October 02 2019 08:39:31 EST Ventricular Rate:  97 PR Interval:    QRS Duration: 111 QT Interval:  386 QTC Calculation: 491 R Axis:   -40 Text Interpretation: Sinus rhythm Ventricular bigeminy Probable left atrial enlargement Left anterior fascicular block Anteroseptal infarct, old Repol abnrm suggests ischemia, anterolateral Confirmed by Dene Gentry [id number]) on 10/02/2019 8:41:15 AM   Radiology CT Head Wo Contrast  Result Date: 10/02/2019 CLINICAL DATA:  Pt brought to ED by EMS from home. Daughter reported pt has had AMS for about a week. Per family, pts PCP told family to bring to ED if condition worsened. Daughter reports pt's AMS was worse this morning. EXAM: CT HEAD WITHOUT CONTRAST TECHNIQUE: Contiguous axial images were obtained from the base of the skull through the vertex without intravenous contrast. COMPARISON:  03/07/2016 FINDINGS: Brain: No evidence of acute infarction, hemorrhage, hydrocephalus, extra-axial collection or mass lesion/mass effect. There are old infarcts, right MCA distribution, middle to posterior frontal lobe and left cerebellum. Vascular: No hyperdense vessel or unexpected calcification. Skull: Normal. Negative for fracture or focal lesion.  Sinuses/Orbits: Globes and orbits are unremarkable. Visualized sinuses and mastoid air cells are clear. Other: None. IMPRESSION: 1. No acute  intracranial abnormalities. 2. Old right MCA distribution and left cerebellar infarcts. Electronically Signed   By: Lajean Manes M.D.   On: 10/02/2019 10:11   DG Chest Port 1 View  Result Date: 10/02/2019 CLINICAL DATA:  Cough and shortness of breath EXAM: PORTABLE CHEST 1 VIEW COMPARISON:  Chest x-ray 12/27/2015 FINDINGS: The heart is borderline enlarged but stable. Stable tortuosity and calcification of the thoracic aorta. Left basilar density could reflect atelectasis or infiltrate. No pleural effusions. IMPRESSION: Left lower lobe atelectasis versus infiltrate. Electronically Signed   By: Marijo Sanes M.D.   On: 10/02/2019 08:54    Procedures Procedures (including critical care time)  Medications Ordered in ED Medications - No data to display  ED Course  I have reviewed the triage vital signs and the nursing notes.  Pertinent labs & imaging results that were available during my care of the patient were reviewed by me and considered in my medical decision making (see chart for details).    MDM Rules/Calculators/A&P                      MDM  Screen complete  Sharon Velasquez was evaluated in Emergency Department on 10/02/2019 for the symptoms described in the history of present illness. She was evaluated in the context of the global COVID-19 pandemic, which necessitated consideration that the patient might be at risk for infection with the SARS-CoV-2 virus that causes COVID-19. Institutional protocols and algorithms that pertain to the evaluation of patients at risk for COVID-19 are in a state of rapid change based on information released by regulatory bodies including the CDC and federal and state organizations. These policies and algorithms were followed during the patient's care in the ED.  Patient is presenting for evaluation of intermittent  confusion and decreased p.o. intake.  Work-up is suggestive of likely UTI with possible early sepsis.  Patient is given IV fluids in the ED and initiated on empiric antibiotic coverage.  Patient is DNR.  This is confirmed with the patient's daughter at bedside.  Patient will require admission.  Medicine service is aware of case and will evaluate for admission.  Final Clinical Impression(s) / ED Diagnoses Final diagnoses:  Dehydration  Confusion  Urinary tract infection with hematuria, site unspecified    Rx / DC Orders ED Discharge Orders    None       Valarie Merino, MD 10/02/19 1100

## 2019-10-02 NOTE — Progress Notes (Signed)
IM paged ref to temp being low. 94.1 rectal after multiple attempts. Will await orders.  Jerald Kief, RN

## 2019-10-02 NOTE — H&P (Signed)
Date: 10/02/2019               Patient Name:  Sharon Velasquez MRN: [id number]  DOB: 1931/05/18 Age / Sex: 86 y.o., female   PCP: Patient, No Pcp Per         Medical Service: Internal Medicine Teaching Service         Attending Physician: Dr. Lucious Groves, DO    First Contact: Dr. Madilyn Fireman Pager: [phone]  Second Contact: Dr. Eileen Stanford  Pager: [phone]       After Hours (After 5p/  First Contact Pager: [phone]  weekends / holidays): Second Contact Pager: [phone]   Chief Complaint: Altered mental status  History of Present Illness: Sharon Velasquez is a 86 year-old female with a medical history of CVA in 2017, recurrent UTIs, breast cancer, Steven-Johnson syndrome, CHF with EF 40% in 2017, PE on Eliquis, and MSSA bacteremia who presents to the ED for evaluation of AMS. Patient is unable to provide history but daughter who is main care taker provided history. Patient was in her usual state of health until 4-5 days ago when she noticed patient was hallucinating (seeing her dead daughter), somnolent, and not as talkative as usual. At baseline, she is bedbound and depends on daughter for ADLs, but very talkative, alert, and oriented. Daughter has not noticed any fever, chills, chest pain, shortness of breath, cough, dysphagia, abdominal or suprapubic pain. She does report patient's UOP has decreased and she has not had a BM in 2 days. Her PO intake has also decreased for the past week. Of note, she was treated for a UTI on 12/31 and completed 7 days of Augmentin. Daughter noticed improvement after antibiotic course but only for a few days.   ED course: VS - T 96.2, P 96, BP 86/57, RR 22 100% on RA. Blood work remarkable for WBC 17.6, Na 148, Cl 112, albumin 2.2, alk phos 217, BNP 164.9, troponin 10, LA 2.0 (prior to IVF), UA showed brown urine, large Hgb , moderate bili, ketones 15, nitrites and leukocytes. EKG without signs of ischemia. CXR showed LLL atelectasis vs infiltrate. She received 1L NS  bolus in the ED and was started on Rocephin and azithromycin.   Meds:  Eliquis 5 mg BID  Ca-Vit D 1 tablet QD Ferrous sulfate 287 mg QD  Folic acid 1 mg QD  Loratadine 10 mg QD  Fish Oil 1200 mg QD  Protonix 40 mg QD  Pravastatin 20 mg QD   Allergies: Allergies as of 10/02/2019 - Review Complete 10/02/2019  Allergen Reaction Noted  . Latex  01/06/2016  . Other Other (See Comments) 01/30/2016   Past Medical History:  Diagnosis Date  . Acute renal insufficiency   . Adenocarcinoma of breast ([place])   . Aromatase inhibitor use   . Atopic dermatitis   . Cancer (HCC)    breast  . Cellulitis   . Debilitated patient   . Dysphagia   . Elevated blood pressure reading without diagnosis of hypertension   . Encounter for preventive health examination   . Esophageal reflux   . GERD (gastroesophageal reflux disease)   . Glossitis   . Hypertension   . Increased urinary frequency   . Intertrigo   . Joint pain   . Localized osteoarthritis of shoulder   . Lump or mass in breast   . Lymphedema   . Malignant neoplasm without specification of site ([place])   . Mammogram abnormal   . Morbid obesity ([place])   .  Neuroendocrine tumor   . Neuroendocrine tumor   . Non-compliance   . Osteoarthritis   . Osteoarthritis   . Primary localized osteoarthritis of right hip   . Rheumatoid arthritis ([place])   . Seasonal allergies   . Subacromial bursitis   . Truncal muscle weakness     Family History:  Family History  Problem Relation Age of Onset  . Cancer Other     Social History: Patient lives at home with daughter and daughter's husband. They are her care takers. She does not drink alcohol, smoke, or use illicit substances.   Review of Systems: A complete ROS was negative except as per HPI.   Physical Exam: Blood pressure (!) 82/59, pulse 91, temperature (!) 96.2 F (35.7 C), temperature source Axillary, resp. rate 15, height _0  (1.6 m), weight 95.3 kg, SpO2 96 %.  General:  chronically-ill appearing female, somnolent, in NAD  HENT: NCAT,OP clear without exudates or erythema, dry MM Eyes: anicteric sclera, PERRL Cardiac: regular rate and rhythm, nl S1/S2, no murmurs, rubs or gallops, no JVD  Pulm: clear anteriorly, unable to auscultate posteriorly  Abd: soft, NTND, bowel sounds are hypoactive, no suprapubic tenderness  Neuro: somnolent but arousable to verbal stimuli, oriented to self and place, no new focal deficits on exam   Ext: warm and well perfused, no peripheral edema Derm: macerated skin L lateral thigh   EKG: personally reviewed my interpretation is NSR, few PVCs, nl intervals, T wave inversions in lateral leads,   CXR: personally reviewed my interpretation is LLL atelectasis vs infiltrate, no signs of edema, no opacities or effusions   Assessment & Plan by Problem: Active Problems:   Sepsis ([place])   # Sepsis secondary to UTI: Patient presents with acute metabolic encephalopathy x 4-5 days and found to be hypotensive on arrival to the ED. Currently hemodynamically stable after IVF in the ED but BP remains soft. Workup remarkable for WBC 17.6 and UA with nitrites and leukocytes suggestive of  UTI as source. She has had one prior episode of sepsis 2 /2 UTI in 2017 but Ucx showed multiple species. Bcx also ordered given prior history of MSSA bacteremia and new sacral and L posterior thigh ulcer. There is also a questionable infiltrate on LLL but low suspicion for CAP given no respiratory symptoms. - Admit to progressive  - S/p 1L NS bolus  - Continue Rocephin  - Discontinue azithromycin    - Follow up Ucx and Bcx  - Tylenol q6h PRN for fever  - WOC consult   # Chronic systolic heart failure - Last echocardiogram from 2017 showed EF 40%, diffuse hypokinesis, no valvular disease, and nl RV function  - Appears euvolemic on exam  - Not on EBT  - Strict I/Os + daily weights  - Follow up echocardiogram - Will monitor volume status closely as she  required IVF for soft BP  # Hx of CVA: Continue home pravastatin 40 mg QD.  # Hs of PE: Continue home Eliquis 5 mg BID.   Diet: soft  VTE ppx: home Eliquis   CODE STATUS: DNR/DNI with full scope of care, confirmed with daughter (decision maker) on admission. She would want pressors if needed. The only thing she would not like is chest compressions, ACLS medications, shocks, and intubation.    Dispo: Admit patient to Inpatient with expected length of stay greater than 2 midnights.  SignedWelford Roche, MD 10/02/2019, 1:39 PM  Pager: [phone]

## 2019-10-02 NOTE — ED Notes (Signed)
Small stage 1 sacral ulcer present. Dressed stage 1 ulcer present on left posterior thigh. EDP examined. Temporary absorbant pads placed over wounds. WOC to consult.

## 2019-10-02 NOTE — Progress Notes (Signed)
  Echocardiogram 2D Echocardiogram has been performed.  Satonya Lux G Zayyan Mullen 10/02/2019, 2:00 PM

## 2019-10-02 NOTE — Progress Notes (Signed)
PT arrived from ER. Pt ca/ox4. Pt given chg bath. Telebox 04 applied/ccmd notified. Vitals stable. Pt oriented to room and call bell within reach.  Will continue to monitor. Jerald Kief, RN

## 2019-10-02 NOTE — ED Notes (Signed)
Attempted to call report to 4E. 

## 2019-10-02 NOTE — ED Notes (Signed)
External purewick catheter applied to pt by ED tech. Hematuria visible in suction tubing upon placement.

## 2019-10-02 NOTE — Progress Notes (Addendum)
Rectal temp 93.4 with Bare hugger on low. B.P. 79/59 ,P-94 , R.-20 skin warm and dry. Alert and little confused. R.N. aware. Rapid Resp. R.N. Mendy aware and coming to see patient. M.D. call and made aware and coming to see patient.  See orders to start L.R. at 500/hr x 1 liter. Patient has not vded. since arrival to floor. Bladder scan greater than 54 ml. M.D. on floor and aware. Patient just vded. a very small amt. Of red color urine.Has been several attempts by [facility] earlier tonight to get oral and axillary Temp. Unable to get it.

## 2019-10-02 NOTE — Significant Event (Addendum)
Rapid Response Event Note  Overview: Called d/t T-93.4(R) and BP-73/48.  RN had just turned bairhuger to highest setting d/t pt temp. I asked RN to turn down to lower setting to prevent vasodilation until MD returned page. Pt here with AMS, decreased PO intake and UOP. Pt being treated for UTI.  Time Called: 2331 Arrival Time: 2335 Event Type: Hypotension  Initial Focused Assessment: Pt laying in bed, lethargic, but alert and oriented(minimally confused), able to move all extremities to command. Pt c/o no pain or SOB. T-93.4(R), HR-91(SR with polymorphic PVCs), RR-20, SpO2-99% on RA, BP-74/48 in R upper arm and 71/58 in R lower arm. L arm is restricted. Lungs diminished t/o. Skin cool to touch.  Pt BP was low in the ED and responded to 1L NS bolus.  Interventions: 1L LR bolus over 2 hours. Bairhugger turned to lowest setting  Plan of Care (if not transferred): Give bolus and monitor response. Wean bairhugger back to highest setting as BP allows. Once temp WNL, can wean to off. Call RRT if further assistance needed. Event Summary: Name of Physician Notified: Benjamine Mola, MD at (PTA RRT)    at    Outcome: Stayed in room and stabalized     [place], Carren Rang

## 2019-10-02 NOTE — Consult Note (Addendum)
WOC Nurse Consult Note: Reason for Consult: Consult requested for left posterior thigh and  Inner buttocks near sacrum.  Daughter at the bedside states these wounds "have been there for awhile and are almost healed." Wound type: Left posterior thigh is currently .partial thickness, 5X.5X.1cm, dry pink woundbed, no odor or drainage, surrounded by pink dry scar tissue Right inner buttock, near sacrum .3X.3X.1cm, stage 2 pressure injury, dry pink woundbed, no odor or drainage, surrounded by pink dry scar tissue Dressing procedure/placement/frequency: Foam dressings to protect and promote healing. Please re-consult if further assistance is needed.  Thank-you,  Julien Girt MSN, [place], [place], [place], [place]

## 2019-10-02 NOTE — ED Triage Notes (Addendum)
Pt brought to ED by EMS from home. Daughter reported pt has had AMS for about a week. Per family, pts PCP told family to bring to ED if condition worsened. Daughter reports pt's AMS was worse this morning. Pt reports she had a urinary infection a couple of weeks ago and is having difficulty going to the bathroom. Pt is currently A&O x 4. Denies pain, n/v, shortness of breath. Hx of stroke and sx for left breast cancer.

## 2019-10-02 NOTE — Progress Notes (Signed)
Warming blanket applied. Will continue to monitor.  Jerald Kief, RN

## 2019-10-03 ENCOUNTER — Inpatient Hospital Stay: Payer: Self-pay

## 2019-10-03 DIAGNOSIS — A419 Sepsis, unspecified organism: Secondary | ICD-10-CM

## 2019-10-03 DIAGNOSIS — R6521 Severe sepsis with septic shock: Secondary | ICD-10-CM

## 2019-10-03 LAB — CBC
HCT: 36.2 % (ref 36.0–46.0)
Hemoglobin: 11.1 g/dL — ABNORMAL LOW (ref 12.0–15.0)
MCH: 28.6 pg (ref 26.0–34.0)
MCHC: 30.7 g/dL (ref 30.0–36.0)
MCV: 93.3 fL (ref 80.0–100.0)
Platelets: 171 10*3/uL (ref 150–400)
RBC: 3.88 MIL/uL (ref 3.87–5.11)
RDW: 18.2 % — ABNORMAL HIGH (ref 11.5–15.5)
WBC: 13.1 10*3/uL — ABNORMAL HIGH (ref 4.0–10.5)
nRBC: 0.2 % (ref 0.0–0.2)

## 2019-10-03 LAB — BASIC METABOLIC PANEL
Anion gap: 6 (ref 5–15)
BUN: 19 mg/dL (ref 8–23)
CO2: 25 mmol/L (ref 22–32)
Calcium: 8.4 mg/dL — ABNORMAL LOW (ref 8.9–10.3)
Chloride: 115 mmol/L — ABNORMAL HIGH (ref 98–111)
Creatinine, Ser: 0.59 mg/dL (ref 0.44–1.00)
GFR calc Af Amer: >60 mL/min (ref >60)
GFR calc non Af Amer: >60 mL/min (ref >60)
Glucose, Bld: 78 mg/dL (ref 70–99)
Potassium: 3.3 mmol/L — ABNORMAL LOW (ref 3.5–5.1)
Sodium: 146 mmol/L — ABNORMAL HIGH (ref 135–145)

## 2019-10-03 LAB — GLUCOSE, CAPILLARY
Glucose-Capillary: 57 mg/dL — ABNORMAL LOW (ref 70–99)
Glucose-Capillary: 63 mg/dL — ABNORMAL LOW (ref 70–99)
Glucose-Capillary: 122 mg/dL — ABNORMAL HIGH (ref 70–99)
Glucose-Capillary: 64 mg/dL — ABNORMAL LOW (ref 70–99)
Glucose-Capillary: 91 mg/dL (ref 70–99)
Glucose-Capillary: 67 mg/dL — ABNORMAL LOW (ref 70–99)
Glucose-Capillary: 82 mg/dL (ref 70–99)
Glucose-Capillary: 90 mg/dL (ref 70–99)

## 2019-10-03 LAB — MRSA PCR SCREENING: MRSA by PCR: NEGATIVE

## 2019-10-03 LAB — CORTISOL-AM, BLOOD: Cortisol - AM: 22.5 ug/dL (ref 6.7–22.6)

## 2019-10-03 MED ORDER — DEXTROSE 50 % IV SOLN
INTRAVENOUS | Status: AC
  Administered 2019-10-03: 16:00:00 25 mL
  Filled 2019-10-03: qty 50

## 2019-10-03 MED ORDER — SODIUM CHLORIDE 0.9% FLUSH: 10.0000 mL | INTRAVENOUS | Status: DC | PRN

## 2019-10-03 MED ORDER — LACTATED RINGERS IV BOLUS
1000.0000 mL | Freq: Once | INTRAVENOUS | Status: AC
  Administered 2019-10-03: 1000 mL via INTRAVENOUS

## 2019-10-03 MED ORDER — HYDROCORTISONE NA SUCCINATE PF 100 MG IJ SOLR
50.0000 mg | Freq: Four times a day (QID) | INTRAMUSCULAR | Status: DC
  Administered 2019-10-03 – 2019-10-04 (×4): 50 mg via INTRAVENOUS
  Filled 2019-10-03 (×4): qty 2

## 2019-10-03 MED ORDER — CHLORHEXIDINE GLUCONATE CLOTH 2 % EX PADS
6.0000 | MEDICATED_PAD | Freq: Every day | CUTANEOUS | Status: DC
  Administered 2019-10-03 – 2019-10-09 (×7): 6 via TOPICAL

## 2019-10-03 MED ORDER — POTASSIUM CHLORIDE 10 MEQ/100ML IV SOLN
10.0000 meq | INTRAVENOUS | Status: AC
  Administered 2019-10-03 – 2019-10-04 (×2): 10 meq via INTRAVENOUS
  Filled 2019-10-03 (×2): qty 100

## 2019-10-03 MED ORDER — DOPAMINE-DEXTROSE 3.2-5 MG/ML-% IV SOLN
0.0000 ug/kg/min | INTRAVENOUS | Status: DC
  Filled 2019-10-03: qty 250

## 2019-10-03 MED ORDER — VANCOMYCIN HCL 1250 MG/250ML IV SOLN
1250.0000 mg | INTRAVENOUS | Status: DC
  Administered 2019-10-04: 1250 mg via INTRAVENOUS
  Filled 2019-10-03: qty 250

## 2019-10-03 MED ORDER — LIP MEDEX EX OINT
TOPICAL_OINTMENT | CUTANEOUS | Status: DC | PRN
  Filled 2019-10-03: qty 7

## 2019-10-03 MED ORDER — LACTATED RINGERS IV BOLUS
500.0000 mL | Freq: Once | INTRAVENOUS | Status: AC
  Administered 2019-10-03: 08:00:00 500 mL via INTRAVENOUS

## 2019-10-03 MED ORDER — VANCOMYCIN HCL 1250 MG/250ML IV SOLN: 1250.0000 mg | Freq: Once | INTRAVENOUS | Status: DC

## 2019-10-03 MED ORDER — VANCOMYCIN HCL 1500 MG/300ML IV SOLN
1500.0000 mg | Freq: Once | INTRAVENOUS | Status: AC
  Administered 2019-10-03: 03:00:00 1500 mg via INTRAVENOUS
  Filled 2019-10-03: qty 300

## 2019-10-03 MED ORDER — SODIUM CHLORIDE 0.9% FLUSH
10.0000 mL | Freq: Two times a day (BID) | INTRAVENOUS | Status: DC
  Administered 2019-10-03: 22:00:00 10 mL
  Administered 2019-10-04: 20 mL
  Administered 2019-10-06 – 2019-10-08 (×6): 10 mL

## 2019-10-03 MED ORDER — POTASSIUM CHLORIDE CRYS ER 20 MEQ PO TBCR
30.0000 meq | EXTENDED_RELEASE_TABLET | Freq: Two times a day (BID) | ORAL | Status: DC
  Administered 2019-10-03: 11:00:00 30 meq via ORAL
  Filled 2019-10-03 (×2): qty 1

## 2019-10-03 MED ORDER — SODIUM CHLORIDE 0.9 % IV SOLN
2.0000 g | Freq: Two times a day (BID) | INTRAVENOUS | Status: DC
  Administered 2019-10-03 – 2019-10-04 (×5): 2 g via INTRAVENOUS
  Filled 2019-10-03 (×9): qty 2

## 2019-10-03 MED ORDER — LACTATED RINGERS IV BOLUS
500.0000 mL | Freq: Once | INTRAVENOUS | Status: AC
  Administered 2019-10-03: 12:00:00 500 mL via INTRAVENOUS

## 2019-10-03 MED ORDER — NOREPINEPHRINE 4 MG/250ML-% IV SOLN
0.0000 ug/min | INTRAVENOUS | Status: DC
  Administered 2019-10-03: 09:00:00 2 ug/min via INTRAVENOUS
  Administered 2019-10-04: 16:00:00 4 ug/min via INTRAVENOUS
  Administered 2019-10-04: 7 ug/min via INTRAVENOUS
  Administered 2019-10-05: 15.013 ug/min via INTRAVENOUS
  Administered 2019-10-05: 5 ug/min via INTRAVENOUS
  Administered 2019-10-06: 2 ug/min via INTRAVENOUS
  Filled 2019-10-03 (×8): qty 250

## 2019-10-03 MED ORDER — DEXTROSE 50 % IV SOLN
INTRAVENOUS | Status: AC
  Filled 2019-10-03: qty 50

## 2019-10-03 NOTE — Progress Notes (Signed)
Dr. Benjamine Mola aware of all V.S. and dec. Urine output. After fl. Bolus in. NO orders given at this time.

## 2019-10-03 NOTE — Progress Notes (Signed)
Subjective: Patient seen at bedside on rounds today. She said she was feeling well but was only oriented to self which is not normal for her. She was having her temperature checked rectally which was 92. She was also hypotensive with a map of 45 at which point rapid was called. She had been hypotensive and hypothermic overnight unresponsive to fluids and warming. We consulted PCCM due to septic shock and they requested transfer of her care to their services.  Consults: PCCM  Objective:  Vital signs in last 24 hours: Vitals:   10/03/19 0927 10/03/19 0930 10/03/19 0933 10/03/19 0936  BP: 122/66 (!) 96/49 (!) 104/53 111/63  Pulse: 82 86 86 89  Resp: 14 18 14 18   Temp:   (!) 92.9 F (33.8 C)   TempSrc:   Rectal   SpO2: 96% 96% 96% 99%  Weight:      Height:       Physical Exam  Constitutional:  Elderly lady lying in bed in no acute distress  Cardiovascular: Normal rate, regular rhythm and normal heart sounds.  No murmur heard. No LE edema  Pulmonary/Chest: Effort normal and breath sounds normal. No respiratory distress.  Abdominal: Soft. Bowel sounds are normal. She exhibits no distension.  Neurological: She is alert.  Oriented only to self  Skin: Skin is dry.  Nursing note and vitals reviewed.  I/Os:  Intake/Output Summary (Last 24 hours) at 10/03/2019 1003 Last data filed at 10/03/2019 0915 Gross per 24 hour  Intake 2900 ml  Output 305 ml  Net 2595 ml   Labs: Results for orders placed or performed during the hospital encounter of 10/02/19 (from the past 24 hour(s))  Culture, blood (routine x 2)     Status: None (Preliminary result)   Collection Time: 10/02/19 10:50 AM   Specimen: BLOOD  Result Value Ref Range   Specimen Description BLOOD SITE NOT SPECIFIED    Special Requests      BOTTLES DRAWN AEROBIC ONLY Blood Culture results may not be optimal due to an inadequate volume of blood received in culture bottles   Culture      NO GROWTH < 24 HOURS Performed at [facility], [place] [address] [place], [place] [address]    Report Status PENDING   Lactic acid, plasma     Status: None   Collection Time: 10/02/19  8:23 PM  Result Value Ref Range   Lactic Acid, Venous 1.5 0.5 - 1.9 mmol/L  CBC     Status: Abnormal   Collection Time: 10/03/19  3:38 AM  Result Value Ref Range   WBC 13.1 (H) 4.0 - 10.5 K/uL   RBC 3.88 3.87 - 5.11 MIL/uL   Hemoglobin 11.1 (L) 12.0 - 15.0 g/dL   HCT 36.2 36.0 - 46.0 %   MCV 93.3 80.0 - 100.0 fL   MCH 28.6 26.0 - 34.0 pg   MCHC 30.7 30.0 - 36.0 g/dL   RDW 18.2 (H) 11.5 - 15.5 %   Platelets 171 150 - 400 K/uL   nRBC 0.2 0.0 - 0.2 %  Basic metabolic panel     Status: Abnormal   Collection Time: 10/03/19  3:38 AM  Result Value Ref Range   Sodium 146 (H) 135 - 145 mmol/L   Potassium 3.3 (L) 3.5 - 5.1 mmol/L   Chloride 115 (H) 98 - 111 mmol/L   CO2 25 22 - 32 mmol/L   Glucose, Bld 78 70 - 99 mg/dL   BUN 19 8 -  23 mg/dL   Creatinine, Ser 0.59 0.44 - 1.00 mg/dL   Calcium 8.4 (L) 8.9 - 10.3 mg/dL   GFR calc non Af Amer >60 >60 mL/min   GFR calc Af Amer >60 >60 mL/min   Anion gap 6 5 - 15  Cortisol-am, blood     Status: None   Collection Time: 10/03/19  5:43 AM  Result Value Ref Range   Cortisol - AM 22.5 6.7 - 22.6 ug/dL  Glucose, capillary     Status: Abnormal   Collection Time: 10/03/19  8:15 AM  Result Value Ref Range   Glucose-Capillary 57 (L) 70 - 99 mg/dL  Glucose, capillary     Status: Abnormal   Collection Time: 10/03/19  8:18 AM  Result Value Ref Range   Glucose-Capillary 63 (L) 70 - 99 mg/dL   Imaging: CT Head Wo Contrast  Result Date: 10/02/2019 CLINICAL DATA:  Pt brought to ED by EMS from home. Daughter reported pt has had AMS for about a week. Per family, pts PCP told family to bring to ED if condition worsened. Daughter reports pt's AMS was worse this morning. EXAM: CT HEAD WITHOUT CONTRAST TECHNIQUE: Contiguous axial images were obtained from the base of the skull through the vertex without  intravenous contrast. COMPARISON:  03/07/2016 FINDINGS: Brain: No evidence of acute infarction, hemorrhage, hydrocephalus, extra-axial collection or mass lesion/mass effect. There are old infarcts, right MCA distribution, middle to posterior frontal lobe and left cerebellum. Vascular: No hyperdense vessel or unexpected calcification. Skull: Normal. Negative for fracture or focal lesion. Sinuses/Orbits: Globes and orbits are unremarkable. Visualized sinuses and mastoid air cells are clear. Other: None. IMPRESSION: 1. No acute intracranial abnormalities. 2. Old right MCA distribution and left cerebellar infarcts. Electronically Signed   By: Lajean Manes M.D.   On: 10/02/2019 10:11   DG Chest Port 1 View  Result Date: 10/02/2019 CLINICAL DATA:  Cough and shortness of breath EXAM: PORTABLE CHEST 1 VIEW COMPARISON:  Chest x-ray 12/27/2015 FINDINGS: The heart is borderline enlarged but stable. Stable tortuosity and calcification of the thoracic aorta. Left basilar density could reflect atelectasis or infiltrate. No pleural effusions. IMPRESSION: Left lower lobe atelectasis versus infiltrate. Electronically Signed   By: Marijo Sanes M.D.   On: 10/02/2019 08:54   ECHOCARDIOGRAM COMPLETE  Result Date: 10/02/2019   ECHOCARDIOGRAM REPORT   Patient Name:   BRYA HAMMERSMITH Date of Exam: 10/02/2019 Medical Rec #:  [id number]       Height:       63.0 in Accession #:    [id number]      Weight:       210.0 lb Date of Birth:  1931-04-03       BSA:          1.97 m Patient Age:    86 years        BP:           91/67 mmHg Patient Gender: F               HR:           93 bpm. Exam Location:  Inpatient Procedure: 2D Echo, Cardiac Doppler and Color Doppler Indications:    I50.23 Acute on chronic systolic (congestive) heart failure  History:        Patient has prior history of Echocardiogram examinations, most                 recent 03/09/2016. Stroke; Risk Factors:Hypertension. Cancer.  GERD.  Sonographer:    Tiffany  Dance Referring Phys: [id number] [place]  1. Technically difficult study. Left ventricular ejection fraction, by visual estimation, is grossly 30 to 35%. The left ventricle has moderate to severely decreased function. Image quality is insufficient to assess for regional wall motion abnormalities. There is mildly increased left ventricular hypertrophy.  2. Left ventricular diastolic parameters are indeterminate.  3. Global right ventricle has normal systolic function.The right ventricular size is normal.  4. Left atrial size was normal.  5. Right atrial size was normal.  6. The mitral valve is abnormal. Moderate annular calcification. Trivial mitral valve regurgitation.  7. The tricuspid valve is normal in structure.  8. The aortic valve is abnormal. Aortic valve regurgitation is trivial. Mild to moderate aortic valve sclerosis/calcification without any evidence of aortic stenosis.  9. The pulmonic valve was grossly normal. Pulmonic valve regurgitation is not visualized. 10. The tricuspid regurgitant velocity is 3.04 m/s, and with an assumed right atrial pressure of 3 mmHg, the estimated right ventricular systolic pressure is mildly elevated at 40.0 mmHg. 11. The inferior vena cava is normal in size with greater than 50% respiratory variability, suggesting right atrial pressure of 3 mmHg. FINDINGS  Left Ventricle: Left ventricular ejection fraction, by visual estimation, is 30 to 35%. The left ventricle has moderate to severely decreased function. The left ventricle demonstrates global hypokinesis. There is mildly increased left ventricular hypertrophy. Left ventricular diastolic parameters are indeterminate. Right Ventricle: The right ventricular size is normal. No increase in right ventricular wall thickness. Global RV systolic function is has normal systolic function. The tricuspid regurgitant velocity is 3.04 m/s, and with an assumed right atrial pressure  of 3 mmHg, the estimated right ventricular  systolic pressure is mildly elevated at 40.0 mmHg. Left Atrium: Left atrial size was normal in size. Right Atrium: Right atrial size was normal in size Pericardium: There is no evidence of pericardial effusion. Mitral Valve: The mitral valve is abnormal. Moderate mitral annular calcification. Trivial mitral valve regurgitation. Tricuspid Valve: The tricuspid valve is normal in structure. Tricuspid valve regurgitation is mild. Aortic Valve: The aortic valve is abnormal. Aortic valve regurgitation is trivial. Mild to moderate aortic valve sclerosis/calcification is present, without any evidence of aortic stenosis. Aortic valve mean gradient measures 4.0 mmHg. Aortic valve peak gradient measures 7.1 mmHg. Aortic valve area, by VTI measures 1.27 cm. Pulmonic Valve: The pulmonic valve was grossly normal. Pulmonic valve regurgitation is not visualized. Pulmonic regurgitation is not visualized. Aorta: The aortic root is normal in size and structure. Venous: The inferior vena cava is normal in size with greater than 50% respiratory variability, suggesting right atrial pressure of 3 mmHg. IAS/Shunts: The interatrial septum was not well visualized.  LEFT VENTRICLE PLAX 2D LVIDd:         5.50 cm  Diastology LVIDs:         3.80 cm  LV e' medial:   9.29 cm/s LV PW:         1.00 cm  LV E/e' medial: 15.2 LV IVS:        1.10 cm LVOT diam:     2.10 cm LV SV:         85 ml LV SV Index:   40.63 LVOT Area:     3.46 cm  RIGHT VENTRICLE             IVC RV Basal diam:  2.90 cm     IVC diam: 1.50 cm RV S  prime:     12.30 cm/s TAPSE (M-mode): 1.3 cm LEFT ATRIUM             Index       RIGHT ATRIUM           Index LA diam:        4.80 cm 2.43 cm/m  RA Area:     16.60 cm LA Vol (A2C):   61.5 ml 31.15 ml/m RA Volume:   37.20 ml  18.84 ml/m LA Vol (A4C):   45.6 ml 23.10 ml/m LA Biplane Vol: 54.5 ml 27.61 ml/m  AORTIC VALVE AV Area (Vmax):    1.46 cm AV Area (Vmean):   1.33 cm AV Area (VTI):     1.27 cm AV Vmax:           133.00 cm/s  AV Vmean:          90.800 cm/s AV VTI:            0.286 m AV Peak Grad:      7.1 mmHg AV Mean Grad:      4.0 mmHg LVOT Vmax:         55.93 cm/s LVOT Vmean:        34.967 cm/s LVOT VTI:          0.105 m LVOT/AV VTI ratio: 0.37  AORTA Ao Root diam: 3.80 cm Ao Asc diam:  3.10 cm MITRAL VALVE                        TRICUSPID VALVE MV Area (PHT): 5.81 cm             TR Peak grad:   37.0 mmHg MV PHT:        37.84 msec           TR Vmax:        304.00 cm/s MV Decel Time: 131 msec MV E velocity: 141.50 cm/s 103 cm/s SHUNTS                                     Systemic VTI:  0.10 m                                     Systemic Diam: 2.10 cm  Oswaldo Milian MD Electronically signed by Oswaldo Milian MD Signature Date/Time: 10/02/2019/9:33:47 PM    Final    Assessment/Plan:  Assessment: Ms. Hardnett is a pleasant 86 yo F w/ a PMHx of HFrEF (09/2019, EF 30-35%), hx of CVA and hx of PE presenting with 4-5 days of AMS, found to be hypotensive with an elevated WBC and a UA suggestive of UTI found to have hypothermia and hypotension unresponsive to fluids whose presentation is concerning for septic shock in setting of UTI whose care has been transferred to [facility] for management.   Plan: Principal Problem:   Sepsis ([place]) Active Problems:   Lower urinary tract infectious disease   Hypernatremia   Chronic systolic CHF (congestive heart failure) (HCC)   Stage 2 skin ulcer of sacral region [facility])  Urosepsis: -pt presenting with 4-5 days of AMS, found to be hypotensive with an elevated WBC and a UA suggestive of UTI being treated for urosepsis with fluids and broad spectrum antibiotics.  -CT Head negative for any acute abnormality and Chest  x-ray without obvious infection thus AMS presumed to be from urosepsis and hypernatremia -patient initially hypotensive in the ED and responded to fluids -initially started on Ceftriaxone, however, overnight patient decompensated with BP of 70/34 and hypothermia to 93.1  treated with fluids and bare hugger and broadening of antibiotics to vanc and ceftriaxone, BP improved to 90/69 -pt with limited output, only 5 ml charted -cortisol-am WNL making adrenal insufficiency unlikely -hypernatremia improving with fluids, most recently 146 -WBC downtrending from 17.3 on admission to 13.1 today, although this could partly be due to dilution from 3750 ml of fluid so far -when rounding on patient this AM temp had decreased further to 92 and BP had decreased further without response to fluids thus PCCM was called for transfer of care  Plan: -1 L LR with warmer -start levophed drip -monitor closely for volume overload given resuc in HFrEF patient  -q shift bladder scan -strict I/Os -monitor fever curve -monitor CBC  HFrEF: -pt w/ hx of HFrEF with Echo on this admission showing decreased EF to 30-35% -given hypotension from urosepsis we are resuscitating with fluids  -so far no concern for volume overload  Plan: -strict I/Os  Dispo: Anticipated discharge pending clinical course.  Al Decant, MD 10/03/2019, 10:03 AM Pager: [phone]

## 2019-10-03 NOTE — Progress Notes (Addendum)
L.R 500 ml infused B.P. 70/58 MAP 64 Jeanmarie Hubert aware. Charge R.N. Tim aware.

## 2019-10-03 NOTE — Progress Notes (Signed)
D.C. bear Hugger per M.D. order

## 2019-10-03 NOTE — Progress Notes (Signed)
Hypoglycemic Event  CBG: 63  Treatment: D50 25 mL (12.5 gm)  Symptoms: Hypotensive, hypothermic  Follow-up CBG: [id number]  CBG Result:122  Possible Reasons for Event: Unknown  Comments/MD notified: Dr. Matilde Sprang at bedside and aware of hypoglycemia and treatment.    Sharon Velasquez

## 2019-10-03 NOTE — Progress Notes (Addendum)
Peripherally Inserted Central Catheter/Midline Placement  The IV Nurse has discussed with the patient and/or persons authorized to consent for the patient, the purpose of this procedure and the potential benefits and risks involved with this procedure.  The benefits include less needle sticks, lab draws from the catheter, and the patient may be discharged home with the catheter. Risks include, but not limited to, infection, bleeding, blood clot (thrombus formation), and puncture of an artery; nerve damage and irregular heartbeat and possibility to perform a PICC exchange if needed/ordered by physician.  Alternatives to this procedure were also discussed.  Bard Power PICC patient education guide, fact sheet on infection prevention and patient information card has been provided to patient /or left at bedside.  Daughter, HPOA, signed consent at bedside.  PICC/Midline Placement Documentation  PICC Double Lumen 10/03/19 PICC Right Cephalic 35 cm 0 cm (Active)  Indication for Insertion or Continuance of Line Vasoactive infusions 10/03/19 1500  Exposed Catheter (cm) 0 cm 10/03/19 1500  Site Assessment Clean;Dry;Intact;Other (Comment) 10/03/19 1500  Lumen #1 Status Flushed;Blood return noted;Saline locked 10/03/19 1500  Lumen #2 Status Flushed;Blood return noted;Saline locked 10/03/19 1500  Dressing Type Transparent 10/03/19 1500  Dressing Status Clean;Dry;Intact 10/03/19 1500  Line Care Connections checked and tightened 10/03/19 1500  Line Adjustment (NICU/IV Team Only) No 10/03/19 1500  Dressing Change Due 10/10/19 10/03/19 1500       Lorenza Cambridge 10/03/2019, 3:07 PM

## 2019-10-03 NOTE — Consult Note (Signed)
NAME:  Sharon Velasquez, MRN:  [id number], DOB:  10-Feb-1931, LOS: 1 ADMISSION DATE:  10/02/2019, CONSULTATION DATE:  10/03/2019 REFERRING MD:  imts, CHIEF COMPLAINT:  Septic shock from UTI   Brief History   Septic shock UTI  History of present illness    86 year old bedbound female.  She has been bedbound for 3 years.  She is a DNR but full medical care including pressors.  She has a left arm /midline.  History is obtained from the medical residents and by review of the chart.  She probably has bedsores according to the history.  She has been admitted with sepsis syndrome with hypothermia, high white count and abnormal urine analysis with microscopic pyuria and hematuria with positive leukocytes and ketonuria and turbid looking urine.  She was started on antibiotics.  Over the course of the night she has been hypotensive despite fluid boluses and at this point in time critical care medicine has been consulted on October 03, 2019.Marland Kitchen  Apparently at baseline even though she is bedbound she is very conversant and alert and oriented x3.  Her renal function has been intact.  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[place]), Aromatase inhibitor use, Atopic dermatitis, Cancer ([place]), Cellulitis, Debilitated patient, Dysphagia, Elevated blood pressure reading without diagnosis of hypertension, Encounter for preventive health examination, Esophageal reflux, GERD (gastroesophageal reflux disease), Glossitis, Hypertension, Increased urinary frequency, Intertrigo, Joint pain, Localized osteoarthritis of shoulder, Lump or mass in breast, Lymphedema, Malignant neoplasm without specification of site [facility]), Mammogram abnormal, Morbid obesity ([place]), Neuroendocrine tumor, Neuroendocrine tumor, Non-compliance, Osteoarthritis, Osteoarthritis, Primary localized osteoarthritis of right hip, Rheumatoid arthritis ([place]), Seasonal allergies, Subacromial bursitis, and  Truncal muscle weakness.   reports that she has never smoked. She does not have any smokeless tobacco history on file.  History reviewed. No pertinent surgical history.  Allergies  Allergen Reactions  . Latex   . 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 MAR    Immunization History  Administered Date(s) Administered  . PPD Test 01/13/2016    Family History  Problem Relation Age of Onset  . Cancer Other      Current Facility-Administered Medications:  .  acetaminophen (TYLENOL) tablet 650 mg, 650 mg, Oral, Q6H PRN **OR** acetaminophen (TYLENOL) suppository 650 mg, 650 mg, Rectal, Q6H PRN, Santos-Sanchez, Idalys, MD .  albuterol (PROVENTIL) (2.5 MG/3ML) 0.083% nebulizer solution 3 mL, 3 mL, Inhalation, TID PRN, Welford Roche, MD .  apixaban (ELIQUIS) tablet 5 mg, 5 mg, Oral, BID, Santos-Sanchez, Idalys, MD, 5 mg at 10/02/19 2216 .  ceFEPIme (MAXIPIME) 2 g in sodium chloride 0.9 % 100 mL IVPB, 2 g, Intravenous, Q12H, Lucious Groves, DO, Stopped at 10/03/19 0236 .  lactated ringers bolus 500 mL, 500 mL, Intravenous, Once, Al Decant, MD .  norepinephrine (LEVOPHED) 4mg  in 293mL premix infusion, 0-40 mcg/min, Intravenous, Titrated, Agyei, Obed K, MD .  pantoprazole (PROTONIX) EC tablet 40 mg, 40 mg, Oral, Daily, Santos-Sanchez, Idalys, MD, 40 mg at 10/02/19 1452 .  potassium chloride (KLOR-CON) CR tablet 30 mEq, 30 mEq, Oral, BID, Al Decant, MD .  pravastatin (PRAVACHOL) tablet 20 mg, 20 mg, Oral, q1800, Santos-Sanchez, Idalys, MD, 20 mg at 10/02/19 1829 .  sodium chloride flush (NS) 0.9 % injection 10-40 mL, 10-40 mL, Intracatheter, Q12H, Lucious Groves, DO, 10 mL at 10/02/19 2218 .  sodium chloride flush (NS) 0.9 % injection 10-40 mL, 10-40 mL, Intracatheter, PRN,  Joni Reining C, DO .  sodium chloride flush (NS) 0.9 % injection 3 mL, 3 mL, Intravenous, Q12H, Santos-Sanchez, Idalys, MD, 3 mL at 10/02/19 1109 .  [START ON 10/04/2019] vancomycin  (VANCOREADY) IVPB 1250 mg/250 mL, 1,250 mg, Intravenous, Once, Hoffman, Erik C, DO .  zinc oxide (BALMEX) 99991111 % cream 1 application, 1 application, Topical, Daily PRN, Welford Roche, MD   Significant Hospital Events   10/02/2019 - admit  Consults:  1/16 - ccm  Procedures:    Significant Diagnostic Tests:  x  Micro Data:   1/15- blood  1/15 - covid  -neg 1/.15- flu neg 1/15 - sputum - mixed   Antimicrobials:  1/15 - azithromycin 1/16 - vanc 1/16 - ceftriaxone 1/16 - cefepime   Interim history/subjective:   October 03, 2019: Seen in 4E04  Objective   Blood pressure 90/69, pulse 89, temperature (!) 93.1 F (33.9 C), temperature source Rectal, resp. rate 19, height 5\' 3"  (1.6 m), weight 73.1 kg, SpO2 93 %.        Intake/Output Summary (Last 24 hours) at 10/03/2019 0821 Last data filed at 10/03/2019 0244 Gross per 24 hour  Intake 2900 ml  Output 5 ml  Net 2895 ml   Filed Weights   10/02/19 0854 10/03/19 0348  Weight: 95.3 kg 73.1 kg    Examination: General: Obese female bedbound.  Looks dry and deconditioned. HENT: Sunken cheek.  Dry lips. Lungs: Clear to auscultation bilaterally without any distress Cardiovascular: Regular rate and rhythm tachycardic Abdomen: Soft obese nontender Extremities: Obese with chronic venous stasis edema Neuro: Alert and oriented x3 GU: Groin site looks okay.  Pubic catheter present.  Resolved Hospital Problem list   x  Assessment & Plan:  ASSESSMENT / PLAN:   A:  History of pulmonary embolism on Eliquis 5 mg daily at home   10/03/2019 -> currently maintaining respiratory status  P:   Continue home Eliquis    A:   Baseline alert and oriented x3 but has history of CVA on home pravastatin P:   Continue to monitor because she is at risk for delirium     A:   Circulatory septic shock likely from UTI  P:  Fluid boluses Levophed for mean arterial pressure greater than 65  * A: Chronic systolic  heart failure in 2017 with ejection fraction 40% diffuse hypokinesis  -Currently appears euvolemic on exam  P: Monitor volume status closely given fluid resuscitation and septic shock Check trop  A: Normal sinus rhythm at admission.  At risk for arrhythmias   P: Monitor   A:   Admission parameters indicative of UTI culture pending. Hypothermic P:   Antibiotics as above Bair higger   A:  At risk for renal injury P:  Monitor closely with hydration  * A:  At risk for electrolyte imbalance  10/03/2019 =- Low K - repleted  P: Correct as needed    A:   History of acid reflux  P:   PPI   A:  Anemia of critical illness   P:  - PRBC for hgb </= 6.9gm%    - exceptions are   -  if ACS susepcted/confirmed then transfuse for hgb </= 8.0gm%,  or    -  active bleeding with hemodynamic instability, then transfuse regardless of hemoglobin value   At at all times try to transfuse 1 unit prbc as possible with exception of active hemorrhage    A At risk for thrombocytopenia from critical illness  P Monitor for  any bleeding especially because she is on Eliquis   A:   At risk for hyper and hypoglycemia and illness Hypothermic P:   SSI Bair hugger  MSK/DERM History of sacral decub and bed bound x 3 =year Hx of RA - not on DMARD/Steroids per record revuew  Plan -Wound care -   Best practice:  Diet: npo Pain/Anxiety/Delirium protocol (if indicated): none VAP protocol (if indicated): Head of bed greater than 30 degree DVT prophylaxis: Home Eliquis GI prophylaxis: Home PPI Glucose control: SSI Mobility: Bedrest Code Status: DNR but full medical care including pressors Family Communication: Resident communicated with family Disposition: Moved to ICU     ATTESTATION & SIGNATURE   The patient Sharon Velasquez is critically ill with multiple organ systems failure and requires high complexity decision making for assessment and support, frequent  evaluation and titration of therapies, application of advanced monitoring technologies and extensive interpretation of multiple databases.   Critical Care Time devoted to patient care services described in this note is  60  Minutes. This time reflects time of care of this signee Dr Brand Males. This critical care time does not reflect procedure time, or teaching time or supervisory time of PA/NP/Med student/Med Resident etc but could involve care discussion time     Dr. Brand Males, M.D., [facility] Pulmonary and Critical Care Medicine Staff Physician [facility] Pager: [phone], If no answer or between  15:00h - 7:00h: call [phone]  10/03/2019 8:21 AM     LABS    PULMONARY No results for input(s): PHART, PCO2ART, PO2ART, HCO3, TCO2, O2SAT in the last 168 hours.  Invalid input(s): PCO2, PO2  CBC Recent Labs  Lab 10/02/19 0824 10/03/19 0338  HGB 14.4 11.1*  HCT 47.9* 36.2  WBC 17.3* 13.1*  PLT 192 171    COAGULATION No results for input(s): INR in the last 168 hours.  CARDIAC  No results for input(s): TROPONINI in the last 168 hours. No results for input(s): PROBNP in the last 168 hours.   CHEMISTRY Recent Labs  Lab 10/02/19 0824 10/03/19 0338  NA 148* 146*  K 4.1 3.3*  CL 112* 115*  CO2 24 25  GLUCOSE 93 78  BUN 19 19  CREATININE 0.59 0.59  CALCIUM 9.7 8.4*   Estimated Creatinine Clearance: 48.4 mL/min (by C-G formula based on SCr of 0.59 mg/dL).   LIVER Recent Labs  Lab 10/02/19 0824  AST 35  ALT 29  ALKPHOS 217*  BILITOT 0.7  PROT 7.8  ALBUMIN 2.2*     INFECTIOUS Recent Labs  Lab 10/02/19 0835 10/02/19 2023  LATICACIDVEN 2.0* 1.5     ENDOCRINE CBG (last 3)  Recent Labs    10/02/19 0907 10/03/19 0815  GLUCAP 75 57*         IMAGING x48h  - image(s) personally visualized  -   highlighted in bold CT Head Wo Contrast  Result Date: 10/02/2019 CLINICAL DATA:  Pt  brought to ED by EMS from home. Daughter reported pt has had AMS for about a week. Per family, pts PCP told family to bring to ED if condition worsened. Daughter reports pt's AMS was worse this morning. EXAM: CT HEAD WITHOUT CONTRAST TECHNIQUE: Contiguous axial images were obtained from the base of the skull through the vertex without intravenous contrast. COMPARISON:  03/07/2016 FINDINGS: Brain: No evidence of acute infarction, hemorrhage, hydrocephalus, extra-axial collection or mass lesion/mass effect. There are old infarcts, right MCA  distribution, middle to posterior frontal lobe and left cerebellum. Vascular: No hyperdense vessel or unexpected calcification. Skull: Normal. Negative for fracture or focal lesion. Sinuses/Orbits: Globes and orbits are unremarkable. Visualized sinuses and mastoid air cells are clear. Other: None. IMPRESSION: 1. No acute intracranial abnormalities. 2. Old right MCA distribution and left cerebellar infarcts. Electronically Signed   By: Lajean Manes M.D.   On: 10/02/2019 10:11   DG Chest Port 1 View  Result Date: 10/02/2019 CLINICAL DATA:  Cough and shortness of breath EXAM: PORTABLE CHEST 1 VIEW COMPARISON:  Chest x-ray 12/27/2015 FINDINGS: The heart is borderline enlarged but stable. Stable tortuosity and calcification of the thoracic aorta. Left basilar density could reflect atelectasis or infiltrate. No pleural effusions. IMPRESSION: Left lower lobe atelectasis versus infiltrate. Electronically Signed   By: Marijo Sanes M.D.   On: 10/02/2019 08:54   ECHOCARDIOGRAM COMPLETE  Result Date: 10/02/2019   ECHOCARDIOGRAM REPORT   Patient Name:   Sharon Velasquez Date of Exam: 10/02/2019 Medical Rec #:  [id number]       Height:       63.0 in Accession #:    [id number]      Weight:       210.0 lb Date of Birth:  March 17, 1931       BSA:          1.97 m Patient Age:    38 years        BP:           91/67 mmHg Patient Gender: F               HR:           93 bpm. Exam Location:   Inpatient Procedure: 2D Echo, Cardiac Doppler and Color Doppler Indications:    I50.23 Acute on chronic systolic (congestive) heart failure  History:        Patient has prior history of Echocardiogram examinations, most                 recent 03/09/2016. Stroke; Risk Factors:Hypertension. Cancer.                 GERD.  Sonographer:    Jonelle Sidle Dance Referring Phys: [id number] [place]  1. Technically difficult study. Left ventricular ejection fraction, by visual estimation, is grossly 30 to 35%. The left ventricle has moderate to severely decreased function. Image quality is insufficient to assess for regional wall motion abnormalities. There is mildly increased left ventricular hypertrophy.  2. Left ventricular diastolic parameters are indeterminate.  3. Global right ventricle has normal systolic function.The right ventricular size is normal.  4. Left atrial size was normal.  5. Right atrial size was normal.  6. The mitral valve is abnormal. Moderate annular calcification. Trivial mitral valve regurgitation.  7. The tricuspid valve is normal in structure.  8. The aortic valve is abnormal. Aortic valve regurgitation is trivial. Mild to moderate aortic valve sclerosis/calcification without any evidence of aortic stenosis.  9. The pulmonic valve was grossly normal. Pulmonic valve regurgitation is not visualized. 10. The tricuspid regurgitant velocity is 3.04 m/s, and with an assumed right atrial pressure of 3 mmHg, the estimated right ventricular systolic pressure is mildly elevated at 40.0 mmHg. 11. The inferior vena cava is normal in size with greater than 50% respiratory variability, suggesting right atrial pressure of 3 mmHg. FINDINGS  Left Ventricle: Left ventricular ejection fraction, by visual estimation, is 30 to 35%. The left ventricle has moderate to severely  decreased function. The left ventricle demonstrates global hypokinesis. There is mildly increased left ventricular hypertrophy. Left  ventricular diastolic parameters are indeterminate. Right Ventricle: The right ventricular size is normal. No increase in right ventricular wall thickness. Global RV systolic function is has normal systolic function. The tricuspid regurgitant velocity is 3.04 m/s, and with an assumed right atrial pressure  of 3 mmHg, the estimated right ventricular systolic pressure is mildly elevated at 40.0 mmHg. Left Atrium: Left atrial size was normal in size. Right Atrium: Right atrial size was normal in size Pericardium: There is no evidence of pericardial effusion. Mitral Valve: The mitral valve is abnormal. Moderate mitral annular calcification. Trivial mitral valve regurgitation. Tricuspid Valve: The tricuspid valve is normal in structure. Tricuspid valve regurgitation is mild. Aortic Valve: The aortic valve is abnormal. Aortic valve regurgitation is trivial. Mild to moderate aortic valve sclerosis/calcification is present, without any evidence of aortic stenosis. Aortic valve mean gradient measures 4.0 mmHg. Aortic valve peak gradient measures 7.1 mmHg. Aortic valve area, by VTI measures 1.27 cm. Pulmonic Valve: The pulmonic valve was grossly normal. Pulmonic valve regurgitation is not visualized. Pulmonic regurgitation is not visualized. Aorta: The aortic root is normal in size and structure. Venous: The inferior vena cava is normal in size with greater than 50% respiratory variability, suggesting right atrial pressure of 3 mmHg. IAS/Shunts: The interatrial septum was not well visualized.  LEFT VENTRICLE PLAX 2D LVIDd:         5.50 cm  Diastology LVIDs:         3.80 cm  LV e' medial:   9.29 cm/s LV PW:         1.00 cm  LV E/e' medial: 15.2 LV IVS:        1.10 cm LVOT diam:     2.10 cm LV SV:         85 ml LV SV Index:   40.63 LVOT Area:     3.46 cm  RIGHT VENTRICLE             IVC RV Basal diam:  2.90 cm     IVC diam: 1.50 cm RV S prime:     12.30 cm/s TAPSE (M-mode): 1.3 cm LEFT ATRIUM             Index       RIGHT  ATRIUM           Index LA diam:        4.80 cm 2.43 cm/m  RA Area:     16.60 cm LA Vol (A2C):   61.5 ml 31.15 ml/m RA Volume:   37.20 ml  18.84 ml/m LA Vol (A4C):   45.6 ml 23.10 ml/m LA Biplane Vol: 54.5 ml 27.61 ml/m  AORTIC VALVE AV Area (Vmax):    1.46 cm AV Area (Vmean):   1.33 cm AV Area (VTI):     1.27 cm AV Vmax:           133.00 cm/s AV Vmean:          90.800 cm/s AV VTI:            0.286 m AV Peak Grad:      7.1 mmHg AV Mean Grad:      4.0 mmHg LVOT Vmax:         55.93 cm/s LVOT Vmean:        34.967 cm/s LVOT VTI:          0.105 m LVOT/AV VTI ratio: 0.37  AORTA Ao Root diam: 3.80 cm Ao Asc diam:  3.10 cm MITRAL VALVE                        TRICUSPID VALVE MV Area (PHT): 5.81 cm             TR Peak grad:   37.0 mmHg MV PHT:        37.84 msec           TR Vmax:        304.00 cm/s MV Decel Time: 131 msec MV E velocity: 141.50 cm/s 103 cm/s SHUNTS                                     Systemic VTI:  0.10 m                                     Systemic Diam: 2.10 cm  Oswaldo Milian MD Electronically signed by Oswaldo Milian MD Signature Date/Time: 10/02/2019/9:33:47 PM    Final

## 2019-10-03 NOTE — Progress Notes (Signed)
eLink Physician-Brief Progress Note Patient Name: Sharon Velasquez DOB: 09/03/1931 MRN: [id number]   Date of Service  10/03/2019  HPI/Events of Note  Pt not tolerating PO potassium, hypokalemic  eICU Interventions  Pt has PICC, 2 runs ordered     Intervention Category Intermediate Interventions: Electrolyte abnormality - evaluation and management  Delsa Grana 10/03/2019, 10:58 PM

## 2019-10-03 NOTE — Progress Notes (Signed)
Pharmacy Antibiotic Note  Sharon Velasquez is a 86 y.o. female with hypothermia/hypotension, possible sepsis.  Pharmacy has been consulted for Vancomycin and Cefepime  dosing.  Plan: Vancomycin 1500 mg IV now, then 1250 mg IV q24h Est AUC 512 Cefepime 2 g IV q12h   Height: 5\' 3"  (160 cm) Weight: 210 lb (95.3 kg) IBW/kg (Calculated) : 52.4  Temp (24hrs), Avg:94.6 F (34.8 C), Min:93.4 F (34.1 C), Max:96.2 F (35.7 C)  Recent Labs  Lab 10/02/19 0824 10/02/19 0835 10/02/19 2023  WBC 17.3*  --   --   CREATININE 0.59  --   --   LATICACIDVEN  --  2.0* 1.5    Estimated Creatinine Clearance: 55.5 mL/min (by C-G formula based on SCr of 0.59 mg/dL).    Allergies  Allergen Reactions  . Latex   . 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 MAR   Caryl Pina 10/03/2019 12:16 AM

## 2019-10-03 NOTE — Significant Event (Addendum)
Rapid Response Event Note  Overview: Time Called: [id number] Arrival Time: 0800 Event Type: Hypotension, Other (Comment)(Hypothermic) Pt had onset of hypotension and hypothermia overnight. Despite fluid bolus of 1.5L this morning, and receiving 2,250cc of fluid bolused in the ED. Total fluid bolus of 3750cc since admission, including ED. Rectal temperature 92.16F, bare hugger placed.    Initial Focused Assessment: Pt lying in bed. Awake, alert, oriented x4. Pedal and radial pulses palpable. Pt cool and dry to touch. Pupils are equal and round, 79mm. Pt able to follow commands. BP rechecked with appropriate cuff placement and size. SBP  75-100, MAP 66-79. Pt had minimal UOP overnight despite fluid given. Fine crackles heard in all lung fields.   Interventions: CBG: 63, treated and rechecked CBG: 122 Levophed started Bair hugger in place I&O cath- 300cc urine return, blood tinged, cloudy  Plan of Care (if not transferred): Transferred to ICU d/t vasopressor requirements and sustained hypothermia.  Event Summary: Name of Physician Notified: IMTS MD at bedside at 0800    at    Outcome: Transferred (Comment)(Transferred to ICU, 2H21)  Event End Time: [place]

## 2019-10-03 NOTE — Progress Notes (Signed)
Paged regarding decreased temperature and blood pressure. Patient evaluated at bedside. Patient answers questions appropriately, denies shortness of breath, any pain.   Vitals:   10/02/19 2332 10/02/19 2341 10/02/19 2342 10/02/19 2350  BP: (!) 73/48 (!) 74/48 (!) 71/58 (!) 80/56  Pulse: 93 88 91 92  Temp:      Resp: 20 (!) 22 (!) 25 19  Height:      Weight:      SpO2: 98% 99% 97% 97%  TempSrc:      BMI (Calculated):        Exam: Cardiac and respiratory exam within normal limits  Assessment/Plan: Patient is an 86 year old female here with septic shock 2/2 UTI who is experiencing decompensation with worsening hypothermia (93.4 F) and hypotension (low of 73/48).  * Bear hugger was temporarily placed for external rewarming but has been discontinued due to hypotension.  * Will volume resuscitate with 1 L LR bolus (500 cc/hr). Will be important to closely monitor respiratory status given heart failure with EF of 30-35% * Will broaden antibiotics to vancomycin+cefepime  Jeanmarie Hubert, MD 10/03/2019, 12:22 AM

## 2019-10-04 DIAGNOSIS — G9341 Metabolic encephalopathy: Secondary | ICD-10-CM

## 2019-10-04 DIAGNOSIS — A4152 Sepsis due to Pseudomonas: Principal | ICD-10-CM

## 2019-10-04 LAB — COMPREHENSIVE METABOLIC PANEL
ALT: 22 U/L (ref 0–44)
AST: 26 U/L (ref 15–41)
Albumin: 1.6 g/dL — ABNORMAL LOW (ref 3.5–5.0)
Alkaline Phosphatase: 162 U/L — ABNORMAL HIGH (ref 38–126)
Anion gap: 9 (ref 5–15)
BUN: 18 mg/dL (ref 8–23)
CO2: 21 mmol/L — ABNORMAL LOW (ref 22–32)
Calcium: 8.3 mg/dL — ABNORMAL LOW (ref 8.9–10.3)
Chloride: 116 mmol/L — ABNORMAL HIGH (ref 98–111)
Creatinine, Ser: 0.59 mg/dL (ref 0.44–1.00)
GFR calc Af Amer: >60 mL/min (ref >60)
GFR calc non Af Amer: >60 mL/min (ref >60)
Glucose, Bld: 106 mg/dL — ABNORMAL HIGH (ref 70–99)
Potassium: 3.8 mmol/L (ref 3.5–5.1)
Sodium: 146 mmol/L — ABNORMAL HIGH (ref 135–145)
Total Bilirubin: 0.7 mg/dL (ref 0.3–1.2)
Total Protein: 5.9 g/dL — ABNORMAL LOW (ref 6.5–8.1)

## 2019-10-04 LAB — CULTURE, RESPIRATORY W GRAM STAIN

## 2019-10-04 LAB — CBC WITH DIFFERENTIAL/PLATELET
Abs Immature Granulocytes: 0.06 10*3/uL (ref 0.00–0.07)
Basophils Absolute: 0 10*3/uL (ref 0.0–0.1)
Basophils Relative: 0 %
Eosinophils Absolute: 0 10*3/uL (ref 0.0–0.5)
Eosinophils Relative: 0 %
HCT: 38.2 % (ref 36.0–46.0)
Hemoglobin: 11.6 g/dL — ABNORMAL LOW (ref 12.0–15.0)
Immature Granulocytes: 0 %
Lymphocytes Relative: 8 %
Lymphs Abs: 1 10*3/uL (ref 0.7–4.0)
MCH: 28.2 pg (ref 26.0–34.0)
MCHC: 30.4 g/dL (ref 30.0–36.0)
MCV: 92.7 fL (ref 80.0–100.0)
Monocytes Absolute: 0.4 10*3/uL (ref 0.1–1.0)
Monocytes Relative: 3 %
Neutro Abs: 12.2 10*3/uL — ABNORMAL HIGH (ref 1.7–7.7)
Neutrophils Relative %: 89 %
Platelets: 181 10*3/uL (ref 150–400)
RBC: 4.12 MIL/uL (ref 3.87–5.11)
RDW: 18.3 % — ABNORMAL HIGH (ref 11.5–15.5)
WBC: 13.7 10*3/uL — ABNORMAL HIGH (ref 4.0–10.5)
nRBC: 0.3 % — ABNORMAL HIGH (ref 0.0–0.2)

## 2019-10-04 LAB — GLUCOSE, CAPILLARY
Glucose-Capillary: 77 mg/dL (ref 70–99)
Glucose-Capillary: 79 mg/dL (ref 70–99)
Glucose-Capillary: 79 mg/dL (ref 70–99)
Glucose-Capillary: 79 mg/dL (ref 70–99)
Glucose-Capillary: 84 mg/dL (ref 70–99)
Glucose-Capillary: 85 mg/dL (ref 70–99)

## 2019-10-04 LAB — LACTIC ACID, PLASMA: Lactic Acid, Venous: 0.7 mmol/L (ref 0.5–1.9)

## 2019-10-04 LAB — MAGNESIUM: Magnesium: 1.7 mg/dL (ref 1.7–2.4)

## 2019-10-04 LAB — PHOSPHORUS: Phosphorus: 2.9 mg/dL (ref 2.5–4.6)

## 2019-10-04 LAB — PROCALCITONIN: Procalcitonin: 0.25 ng/mL

## 2019-10-04 LAB — TROPONIN I (HIGH SENSITIVITY): Troponin I (High Sensitivity): 19 ng/L — ABNORMAL HIGH (ref <18)

## 2019-10-04 MED ORDER — HYDROCORTISONE NA SUCCINATE PF 100 MG IJ SOLR
50.0000 mg | Freq: Three times a day (TID) | INTRAMUSCULAR | Status: DC
  Administered 2019-10-04 – 2019-10-06 (×6): 50 mg via INTRAVENOUS
  Filled 2019-10-04 (×6): qty 2

## 2019-10-04 MED ORDER — LACTATED RINGERS IV BOLUS
500.0000 mL | Freq: Once | INTRAVENOUS | Status: AC
  Administered 2019-10-04: 04:00:00 500 mL via INTRAVENOUS

## 2019-10-04 MED ORDER — MAGNESIUM SULFATE 2 GM/50ML IV SOLN
2.0000 g | Freq: Once | INTRAVENOUS | Status: AC
  Administered 2019-10-04: 08:00:00 2 g via INTRAVENOUS
  Filled 2019-10-04: qty 50

## 2019-10-04 NOTE — Discharge Instructions (Signed)

## 2019-10-04 NOTE — Consult Note (Addendum)
NAME:  Sharon Velasquez, MRN:  [id number], DOB:  12-03-1930, LOS: 2 ADMISSION DATE:  10/02/2019, CONSULTATION DATE:  10/03/2019 REFERRING MD:  imts, CHIEF COMPLAINT:  Septic shock from UTI   Brief History   Patient presented with AMS of 4-5 days duration. She was found to be hypotensive with presumed septic shock from a urinary source. She was transferred to the ICU on 1/16 due to hypotension insufficiently responsive to crystalloid resuscitation.   History of present illness   Sharon Velasquez is an 86 year old bedbound female.  She has been bedbound for 3 years.  She is a DNR but full medical care including pressors.  She has a left arm /midline.  She was initially admitted with sepsis syndrome with hypothermia, high white count and abnormal urine analysis with microscopic pyuria and hematuria with positive leukocytes and ketonuria and turbid looking urine.  She was started empirically on broad spectrum antibiotics.  Over the course of her stay her hypotension progressively worsened despite fluid boluses and required low does vasopressors to maintain a perfusing blood pressure.  Apparently, at baseline, even though she is bedbound she is very conversant and alert and oriented x3, which she was not during our initial evaluation.  Her renal function has been intact.  Past Medical History    has a past medical history of Acute renal insufficiency, Adenocarcinoma of breast ([place]), Aromatase inhibitor use, Atopic dermatitis, Cancer ([place]), Cellulitis, Debilitated patient, Dysphagia, Elevated blood pressure reading without diagnosis of hypertension, Encounter for preventive health examination, Esophageal reflux, GERD (gastroesophageal reflux disease), Glossitis, Hypertension, Increased urinary frequency, Intertrigo, Joint pain, Localized osteoarthritis of shoulder, Lump or mass in breast, Lymphedema, Malignant neoplasm without specification of site [facility]), Mammogram abnormal, Morbid obesity ([place]), Neuroendocrine  tumor, Neuroendocrine tumor, Non-compliance, Osteoarthritis, Osteoarthritis, Primary localized osteoarthritis of right hip, Rheumatoid arthritis ([place]), Seasonal allergies, Subacromial bursitis, and Truncal muscle weakness.   reports that she has never smoked. She does not have any smokeless tobacco history on file.  History reviewed. No pertinent surgical history.  Allergies  Allergen Reactions  . Latex   . 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 MAR    Immunization History  Administered Date(s) Administered  . PPD Test 01/13/2016    Family History  Problem Relation Age of Onset  . Cancer Other      Current Facility-Administered Medications:  .  acetaminophen (TYLENOL) tablet 650 mg, 650 mg, Oral, Q6H PRN **OR** acetaminophen (TYLENOL) suppository 650 mg, 650 mg, Rectal, Q6H PRN, Santos-Sanchez, Idalys, MD .  albuterol (PROVENTIL) (2.5 MG/3ML) 0.083% nebulizer solution 3 mL, 3 mL, Inhalation, TID PRN, Welford Roche, MD .  apixaban (ELIQUIS) tablet 5 mg, 5 mg, Oral, BID, Santos-Sanchez, Idalys, MD, 5 mg at 10/03/19 2207 .  ceFEPIme (MAXIPIME) 2 g in sodium chloride 0.9 % 100 mL IVPB, 2 g, Intravenous, Q12H, Lucious Groves, DO, Stopped at 10/03/19 2355 .  Chlorhexidine Gluconate Cloth 2 % PADS 6 each, 6 each, Topical, Daily, Agarwala, Ravi, MD, 6 each at 10/03/19 1129 .  hydrocortisone sodium succinate (SOLU-CORTEF) 100 MG injection 50 mg, 50 mg, Intravenous, Q6H, Candee Furbish, MD, 50 mg at 10/04/19 0240 .  lip balm (CARMEX) ointment, , Topical, PRN, Candee Furbish, MD .  norepinephrine (LEVOPHED) 4mg  in 258mL premix infusion, 0-40 mcg/min, Intravenous, Titrated, Agyei, Obed K, MD, Last Rate: 18.75 mL/hr at 10/04/19 0500, 5 mcg/min at 10/04/19 0500 .  pantoprazole (PROTONIX) EC tablet 40 mg, 40 mg, Oral, Daily, Welford Roche, MD, 40  mg at 10/03/19 1120 .  pravastatin (PRAVACHOL) tablet 20 mg, 20 mg, Oral, q1800, Santos-Sanchez, Idalys, MD, 20 mg at  10/02/19 1829 .  sodium chloride flush (NS) 0.9 % injection 10-40 mL, 10-40 mL, Intracatheter, Q12H, Lucious Groves, DO, 10 mL at 10/03/19 2204 .  sodium chloride flush (NS) 0.9 % injection 10-40 mL, 10-40 mL, Intracatheter, PRN, Heber [place], Erik C, DO .  sodium chloride flush (NS) 0.9 % injection 10-40 mL, 10-40 mL, Intracatheter, Q12H, Brand Males, MD, 10 mL at 10/03/19 2204 .  sodium chloride flush (NS) 0.9 % injection 10-40 mL, 10-40 mL, Intracatheter, PRN, Brand Males, MD .  sodium chloride flush (NS) 0.9 % injection 3 mL, 3 mL, Intravenous, Q12H, Santos-Sanchez, Idalys, MD, 3 mL at 10/03/19 2204 .  vancomycin (VANCOREADY) IVPB 1250 mg/250 mL, 1,250 mg, Intravenous, Q24H, Lyndee Leo, RPH .  zinc oxide (BALMEX) 99991111 % cream 1 application, 1 application, Topical, Daily PRN, Santos-Sanchez, Idalys, MD   ROS  Difficult to obtain due to medical illness limiting mental status. Patient malaise, myalgias, chest pain, dyspnea, dysuria or headache.   Significant Hospital Events   10/02/2019 - admit 1/16 - Transferred to the ICU  Consults:  PCCM  Procedures:  1/16-Midline placement  Significant Diagnostic Tests:  N/A  Micro Data:  1/15- blood - ntd 1/15 - covid  -neg 1/.15- flu neg 1/15 - sputum - mixed 1/15 - Urine - pending  Antimicrobials:  1/15 - azithromycin >> 1/15 1/16 - vanc >> 1/16 - ceftriaxone >> 1/15 1/16 - cefepime >>  Interim history/subjective:  October 03, 2019: Seen in 4E04  Objective   Blood pressure 118/85, pulse 61, temperature (!) 96.1 F (35.6 C), temperature source Axillary, resp. rate 14, height 5\' 3"  (1.6 m), weight 71.5 kg, SpO2 100 %. CVP:  [6 mmHg] 6 mmHg      Intake/Output Summary (Last 24 hours) at 10/04/2019 0648 Last data filed at 10/04/2019 0500 Gross per 24 hour  Intake 1338.68 ml  Output 535 ml  Net 803.68 ml   Filed Weights   10/02/19 0854 10/03/19 0348 10/04/19 0500  Weight: 95.3 kg 73.1 kg 71.5 kg    Examination: General: Alert to self, in no acute distress, afebrile, nondiaphoretic HEENT: PEERL, EMO intact Cardio: Irregularly irregular rhythm, no mrg's  Pulmonary: CTA bilaterally, no wheezing or crackles  Abdomen: Bowel sounds normal, soft, nontender  MSK: BLE nontender, nonedematous Neuro: Alert and oriented to self and location Psych: Appropriate affect, not depressed in appearance, engages well  I/O: 4.7L in, 0.6L out Weight unreliable based on recorded values  Resolved Hospital Problem list   N/A  Assessment & Plan:  Distributive shock likely 2/2 to sepsis: UTI most likely source, culture pending: Hypothermia: Initial Procalcitonin 0.25. Will consider deescalating antibiotics if procal trending downward and clinical improvement occurs. Will likely need 1-2 more days in the ICU before she is stable for the floor.  Plan -Urine culture growing Pseudomonas aeruginosa with sensitivities pending -Continue cefepime -Discontinued vanc  -Continue warming per protocol -Bolus fluids cautiously due to HFrEF -Continue low dose levophed to maintain MAP >65 trying to wean -Trending Procalcitonin  -Slow taper of stress dose steroids  History of pulmonary embolism on Eliquis 5 mg daily at home Currently maintaining respiratory status. No indication of recurrent PE. Plan -Continue home Eliquis  Hx of CVA: Residual Plan -Continue statin and Eliquis  -Continue close monitoring as she is at risk for delirium  Chronic systolic heart failure in 2017 with ejection fraction  30-35% diffuse hypokinesis Currently appears euvolemic on exam despite several fluid boluses and net positive 4L since admission.   Plan -Monitor volume status closely given fluid resuscitation and septic shock -Check trop  At risk for renal injury sCr stable today.  Plan -Monitor closely with hydration  At risk for electrolyte imbalance Low K - repleted Na elevated to 146, will avoid NS  boluses Plan -Correct as needed -SLP consult placed for PO status -Repleted Mag and potassium   Anemia: Hgb stable at 11.6. Delta likely due to fluid administration.  Plan -Monitor  At risk for hyper and hypoglycemia and illness Hypothermic Plan -SSI -Bair hugger  MSK/DERM History of sacral decub and bed bound x 3 =year Hx of RA - not on DMARD/Steroids per record revuew Plan -Wound care  Best practice:  Diet: npo, advance as tolerated  Pain/Anxiety/Delirium protocol (if indicated): none VAP protocol (if indicated): Head of bed greater than 30 degree DVT prophylaxis: Home Eliquis GI prophylaxis: Home PPI Glucose control: SSI Mobility: Bedrest Code Status: DNR but full medical care including pressors Family Communication: Resident communicated with family Disposition: Moved to ICU  LABS   PULMONARY No results for input(s): PHART, PCO2ART, PO2ART, HCO3, TCO2, O2SAT in the last 168 hours.  Invalid input(s): PCO2, PO2  CBC Recent Labs  Lab 10/02/19 0824 10/03/19 0338 10/04/19 0250  HGB 14.4 11.1* 11.6*  HCT 47.9* 36.2 38.2  WBC 17.3* 13.1* 13.7*  PLT 192 171 181   COAGULATION No results for input(s): INR in the last 168 hours.  CARDIAC  No results for input(s): TROPONINI in the last 168 hours. No results for input(s): PROBNP in the last 168 hours.  CHEMISTRY Recent Labs  Lab 10/02/19 0824 10/02/19 0824 10/03/19 0338 10/04/19 0250  NA 148*  --  146* 146*  K 4.1   < > 3.3* 3.8  CL 112*  --  115* 116*  CO2 24  --  25 21*  GLUCOSE 93  --  78 106*  BUN 19  --  19 18  CREATININE 0.59  --  0.59 0.59  CALCIUM 9.7  --  8.4* 8.3*  MG  --   --   --  1.7  PHOS  --   --   --  2.9   < > = values in this interval not displayed.   Estimated Creatinine Clearance: 47.8 mL/min (by C-G formula based on SCr of 0.59 mg/dL).  LIVER Recent Labs  Lab 10/02/19 0824 10/04/19 0250  AST 35 26  ALT 29 22  ALKPHOS 217* 162*  BILITOT 0.7 0.7  PROT 7.8 5.9*   ALBUMIN 2.2* 1.6*   INFECTIOUS Recent Labs  Lab 10/02/19 0835 10/02/19 2023 10/04/19 0250  LATICACIDVEN 2.0* 1.5 0.7  PROCALCITON  --   --  0.25   ENDOCRINE CBG (last 3)  Recent Labs    10/03/19 2101 10/04/19 0023 10/04/19 [place]   Kathi Ludwig, MD [facility], PGY-3  Please see Attending A/P and/or Addendum for final recommendations.

## 2019-10-04 NOTE — Progress Notes (Signed)
eLink Physician-Brief Progress Note Patient Name: Sharon Velasquez DOB: [birth year] MRN: [id number]   Date of Service  10/04/2019  HPI/Events of Note  Oliguria  eICU Interventions  LR 500 ml iv fluid bolus at 250 ml/ hour        Wilian Kwong U Adaly Puder 10/04/2019, 3:32 AM

## 2019-10-05 ENCOUNTER — Inpatient Hospital Stay (HOSPITAL_COMMUNITY): Payer: Medicare Other

## 2019-10-05 LAB — GLUCOSE, CAPILLARY
Glucose-Capillary: 114 mg/dL — ABNORMAL HIGH (ref 70–99)
Glucose-Capillary: 117 mg/dL — ABNORMAL HIGH (ref 70–99)
Glucose-Capillary: 73 mg/dL (ref 70–99)
Glucose-Capillary: 74 mg/dL (ref 70–99)
Glucose-Capillary: 75 mg/dL (ref 70–99)
Glucose-Capillary: 91 mg/dL (ref 70–99)

## 2019-10-05 LAB — CBC WITH DIFFERENTIAL/PLATELET
Abs Immature Granulocytes: 0.11 10*3/uL — ABNORMAL HIGH (ref 0.00–0.07)
Basophils Absolute: 0 10*3/uL (ref 0.0–0.1)
Basophils Relative: 0 %
Eosinophils Absolute: 0 10*3/uL (ref 0.0–0.5)
Eosinophils Relative: 0 %
HCT: 34.6 % — ABNORMAL LOW (ref 36.0–46.0)
Hemoglobin: 10.7 g/dL — ABNORMAL LOW (ref 12.0–15.0)
Immature Granulocytes: 1 %
Lymphocytes Relative: 10 %
Lymphs Abs: 1.2 10*3/uL (ref 0.7–4.0)
MCH: 28.5 pg (ref 26.0–34.0)
MCHC: 30.9 g/dL (ref 30.0–36.0)
MCV: 92 fL (ref 80.0–100.0)
Monocytes Absolute: 0.7 10*3/uL (ref 0.1–1.0)
Monocytes Relative: 6 %
Neutro Abs: 10.1 10*3/uL — ABNORMAL HIGH (ref 1.7–7.7)
Neutrophils Relative %: 83 %
Platelets: 144 10*3/uL — ABNORMAL LOW (ref 150–400)
RBC: 3.76 MIL/uL — ABNORMAL LOW (ref 3.87–5.11)
RDW: 18.6 % — ABNORMAL HIGH (ref 11.5–15.5)
WBC: 12 10*3/uL — ABNORMAL HIGH (ref 4.0–10.5)
nRBC: 0.9 % — ABNORMAL HIGH (ref 0.0–0.2)

## 2019-10-05 LAB — BASIC METABOLIC PANEL
Anion gap: 9 (ref 5–15)
BUN: 18 mg/dL (ref 8–23)
CO2: 18 mmol/L — ABNORMAL LOW (ref 22–32)
Calcium: 7.4 mg/dL — ABNORMAL LOW (ref 8.9–10.3)
Chloride: 120 mmol/L — ABNORMAL HIGH (ref 98–111)
Creatinine, Ser: 0.7 mg/dL (ref 0.44–1.00)
GFR calc Af Amer: >60 mL/min (ref >60)
GFR calc non Af Amer: >60 mL/min (ref >60)
Glucose, Bld: 87 mg/dL (ref 70–99)
Potassium: 3.3 mmol/L — ABNORMAL LOW (ref 3.5–5.1)
Sodium: 147 mmol/L — ABNORMAL HIGH (ref 135–145)

## 2019-10-05 LAB — PHOSPHORUS: Phosphorus: 3.4 mg/dL (ref 2.5–4.6)

## 2019-10-05 LAB — URINE CULTURE: Culture: >=100000 — AB

## 2019-10-05 LAB — MAGNESIUM: Magnesium: 2.1 mg/dL (ref 1.7–2.4)

## 2019-10-05 LAB — PROCALCITONIN: Procalcitonin: 0.16 ng/mL

## 2019-10-05 LAB — TSH: TSH: 2.506 u[IU]/mL (ref 0.350–4.500)

## 2019-10-05 MED ORDER — POTASSIUM CHLORIDE 10 MEQ/50ML IV SOLN
10.0000 meq | INTRAVENOUS | Status: AC
  Administered 2019-10-05 (×4): 10 meq via INTRAVENOUS
  Filled 2019-10-05 (×4): qty 50

## 2019-10-05 MED ORDER — CHLORHEXIDINE GLUCONATE 0.12 % MT SOLN
15.0000 mL | Freq: Two times a day (BID) | OROMUCOSAL | Status: DC
  Administered 2019-10-05 – 2019-10-09 (×10): 15 mL via OROMUCOSAL
  Filled 2019-10-05 (×8): qty 15

## 2019-10-05 MED ORDER — SODIUM CHLORIDE 0.9 % IV SOLN
2.0000 g | Freq: Two times a day (BID) | INTRAVENOUS | Status: AC
  Administered 2019-10-05 – 2019-10-08 (×6): 2 g via INTRAVENOUS
  Filled 2019-10-05 (×8): qty 2

## 2019-10-05 MED ORDER — SODIUM CHLORIDE 0.9 % IV SOLN
2.0000 g | Freq: Three times a day (TID) | INTRAVENOUS | Status: DC
  Administered 2019-10-05: 10:00:00 2 g via INTRAVENOUS
  Filled 2019-10-05 (×2): qty 2

## 2019-10-05 MED ORDER — ALBUMIN HUMAN 5 % IV SOLN
12.5000 g | Freq: Once | INTRAVENOUS | Status: AC
  Administered 2019-10-05: 12.5 g via INTRAVENOUS
  Filled 2019-10-05: qty 250

## 2019-10-05 MED ORDER — ORAL CARE MOUTH RINSE
15.0000 mL | Freq: Two times a day (BID) | OROMUCOSAL | Status: DC
  Administered 2019-10-05 – 2019-10-09 (×10): 15 mL via OROMUCOSAL

## 2019-10-05 MED ORDER — LACTATED RINGERS IV SOLN: INTRAVENOUS | Status: DC

## 2019-10-05 MED ORDER — RESOURCE THICKENUP CLEAR PO POWD
Freq: Once | ORAL | Status: AC
  Filled 2019-10-05: qty 125

## 2019-10-05 MED ORDER — DEXTROSE 5 % IV SOLN: INTRAVENOUS | Status: DC

## 2019-10-05 NOTE — Progress Notes (Addendum)
NAME:  Sharon Velasquez, MRN:  [id number], DOB:  [birth year], LOS: 3 ADMISSION DATE:  10/02/2019, CONSULTATION DATE:  10/03/2019 REFERRING MD:  imts, CHIEF COMPLAINT:  Septic shock from UTI   Brief History   Patient presented with AMS of 4-5 days duration. She was found to be hypotensive with presumed septic shock from a urinary source. She was transferred to the ICU on 1/16 due to hypotension insufficiently responsive to crystalloid resuscitation.   History of present illness   Sharon Velasquez is an 86 year old bedbound female.  She has been bedbound for 3 years.  She is a DNR but full medical care including pressors.  She has a left arm /midline.  She was initially admitted with sepsis syndrome with hypothermia, high white count and abnormal urine analysis with microscopic pyuria and hematuria with positive leukocytes and ketonuria and turbid looking urine.  She was started empirically on broad spectrum antibiotics.  Over the course of her stay her hypotension progressively worsened despite fluid boluses and required low does vasopressors to maintain a perfusing blood pressure.  Apparently, at baseline, even though she is bedbound she is very conversant and alert and oriented x3, which she was not during our initial evaluation.  Her renal function has been intact.  Past Medical History    has a past medical history of Acute renal insufficiency, Adenocarcinoma of breast ([place]), Aromatase inhibitor use, Atopic dermatitis, Cancer ([place]), Cellulitis, Debilitated patient, Dysphagia, Elevated blood pressure reading without diagnosis of hypertension, Encounter for preventive health examination, Esophageal reflux, GERD (gastroesophageal reflux disease), Glossitis, Hypertension, Increased urinary frequency, Intertrigo, Joint pain, Localized osteoarthritis of shoulder, Lump or mass in breast, Lymphedema, Malignant neoplasm without specification of site [facility]), Mammogram abnormal, Morbid obesity ([place]), Neuroendocrine  tumor, Neuroendocrine tumor, Non-compliance, Osteoarthritis, Osteoarthritis, Primary localized osteoarthritis of right hip, Rheumatoid arthritis ([place]), Seasonal allergies, Subacromial bursitis, and Truncal muscle weakness.   reports that she has never smoked. She does not have any smokeless tobacco history on file.  History reviewed. No pertinent surgical history.  Allergies  Allergen Reactions  . Latex   . 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 MAR    Immunization History  Administered Date(s) Administered  . PPD Test 01/13/2016    Family History  Problem Relation Age of Onset  . Cancer Other      Current Facility-Administered Medications:  .  acetaminophen (TYLENOL) tablet 650 mg, 650 mg, Oral, Q6H PRN **OR** acetaminophen (TYLENOL) suppository 650 mg, 650 mg, Rectal, Q6H PRN, Santos-Sanchez, Idalys, MD .  albuterol (PROVENTIL) (2.5 MG/3ML) 0.083% nebulizer solution 3 mL, 3 mL, Inhalation, TID PRN, Welford Roche, MD .  apixaban (ELIQUIS) tablet 5 mg, 5 mg, Oral, BID, Santos-Sanchez, Idalys, MD, 5 mg at 10/04/19 2141 .  ceFEPIme (MAXIPIME) 2 g in sodium chloride 0.9 % 100 mL IVPB, 2 g, Intravenous, Q12H, Lucious Groves, DO, Stopped at 10/04/19 2143 .  chlorhexidine (PERIDEX) 0.12 % solution 15 mL, 15 mL, Mouth Rinse, BID, Candee Furbish, MD, 15 mL at 10/05/19 0400 .  Chlorhexidine Gluconate Cloth 2 % PADS 6 each, 6 each, Topical, Daily, Kipp Brood, MD, 6 each at 10/04/19 1000 .  hydrocortisone sodium succinate (SOLU-CORTEF) 100 MG injection 50 mg, 50 mg, Intravenous, Q8H, Bartt Gonzaga, MD, 50 mg at 10/05/19 0046 .  lip balm (CARMEX) ointment, , Topical, PRN, Candee Furbish, MD .  MEDLINE mouth rinse, 15 mL, Mouth Rinse, q12n4p, Candee Furbish, MD .  norepinephrine (LEVOPHED) 4mg  in 281mL premix infusion,  0-40 mcg/min, Intravenous, Titrated, Agyei, Obed K, MD, Last Rate: 18.75 mL/hr at 10/05/19 0600, 5 mcg/min at 10/05/19 0600 .  pantoprazole  (PROTONIX) EC tablet 40 mg, 40 mg, Oral, Daily, Santos-Sanchez, Idalys, MD, 40 mg at 10/03/19 1120 .  pravastatin (PRAVACHOL) tablet 20 mg, 20 mg, Oral, q1800, Santos-Sanchez, Idalys, MD, 20 mg at 10/02/19 1829 .  sodium chloride flush (NS) 0.9 % injection 10-40 mL, 10-40 mL, Intracatheter, Q12H, Hoffman, Erik C, DO, 20 mL at 10/04/19 2200 .  sodium chloride flush (NS) 0.9 % injection 10-40 mL, 10-40 mL, Intracatheter, PRN, Heber [place], Erik C, DO .  sodium chloride flush (NS) 0.9 % injection 10-40 mL, 10-40 mL, Intracatheter, Q12H, Ramaswamy, Murali, MD, 20 mL at 10/04/19 2200 .  sodium chloride flush (NS) 0.9 % injection 10-40 mL, 10-40 mL, Intracatheter, PRN, Brand Males, MD .  sodium chloride flush (NS) 0.9 % injection 3 mL, 3 mL, Intravenous, Q12H, Santos-Sanchez, Idalys, MD, 3 mL at 10/03/19 2204 .  zinc oxide (BALMEX) 99991111 % cream 1 application, 1 application, Topical, Daily PRN, Santos-Sanchez, Idalys, MD   ROS  Difficult to obtain due to medical illness limiting mental status. Patient malaise, myalgias, chest pain, dyspnea, dysuria or headache.   Significant Hospital Events   10/02/2019 - admit 1/16 - Transferred to the ICU  Consults:  PCCM  Procedures:  1/16-Midline placement  Significant Diagnostic Tests:  N/A  Micro Data:  1/15- blood - ntd 1/15 - covid  -neg 1/.15- flu neg 1/15 - sputum - mixed 1/15 - Urine - Pseudomonas aeruginosa  1/15 - Respiratory - abundant E. Coli   Antimicrobials:  1/15 - azithromycin >> 1/15 1/16 - vanc >> 1/17 1/16 - ceftriaxone >> 1/15 1/16 - cefepime >>  Interim history/subjective:  Patient continues to be hypotensive absent vasopressors. She is more alert today and oriented to person and location.   Objective   Blood pressure (!) 94/56, pulse 96, temperature (!) 97.5 F (36.4 C), resp. rate 16, height 5\' 3"  (1.6 m), weight 74.5 kg, SpO2 100 %. CVP:  [0 mmHg-12 mmHg] 5 mmHg      Intake/Output Summary (Last 24 hours) at  10/05/2019 0642 Last data filed at 10/05/2019 0600 Gross per 24 hour  Intake 1947.16 ml  Output 345 ml  Net 1602.16 ml   Filed Weights   10/03/19 0348 10/04/19 0500 10/05/19 0500  Weight: 73.1 kg 71.5 kg 74.5 kg   Examination: General: A/O x4, in no acute distress, afebrile, nondiaphoretic HEENT: PEERL, EMO intact Cardio: Irregular rhythm with bradycardia, no mrg's  Pulmonary: No wwheezing but marked rhonchi bilaterally   Abdomen: Bowel sounds normal, soft, nontender  MSK: BLE nontender, nonedematous Neuro: Alert, CNII-XII grossly intact, strength 4/5 in the upper extremities Psych: Appropriate affect, not depressed in appearance, engages well  I/O: 1.5L in, 0.32L out  Resolved Hospital Problem list   N/A  Assessment & Plan:  Distributive shock likely 2/2 to sepsis: UTI most likely source: Hypothermia: Will consider deescalating antibiotics if procal trending downward and clinical improvement occurs. Will likely need 1-2 more days in the ICU before she is stable for the floor. Persistently hypothermic, hypotensive, white count improving but elevated, with new rhonchi on exam and E. Coli in respiratory culture concerning for pneumonia? Plan -Urine culture growing Pseudomonas aeruginosa with sensitivities still pending -Continue cefepime -CXR ordered today -Continue warming per protocol -Bolus fluids cautiously due to HFrEF -Continue low dose levophed to maintain MAP >65 trying to wean -Trending Procalcitonin  -Slow taper of stress  dose steroids  History of pulmonary embolism on Eliquis 5 mg daily at home Currently maintaining respiratory status. No indication of recurrent PE. Plan -Continue home Eliquis  Hx of CVA: Residual Plan -Continue statin and Eliquis  -Continue close monitoring as she is at risk for delirium  Chronic systolic heart failure in 2017 with ejection fraction 30-35% diffuse hypokinesis Currently appears euvolemic on exam despite several fluid  boluses and net positive 4L since admission.   Plan -Monitor volume status closely given fluid resuscitation and septic shock -Check trop  At risk for renal injury sCr stable today.  Plan -Monitor closely with hydration  At risk for electrolyte imbalance Low K - repleted Na elevated to 147, will avoid NS boluses. Plan -Start D5 at 50cc/hr due to increasin Na concentration -Correct as needed -SLP consult placed for PO status -Repleted Mag and potassium   Anemia: Hgb stable at 11.6. Delta likely due to fluid administration.  Plan -Monitor  At risk for hyper and hypoglycemia and illness Hypothermic Plan -SSI -Bair hugger  MSK/DERM History of sacral decub and bed bound x 3 =year Hx of RA - not on DMARD/Steroids per record revuew Plan -Wound care  Best practice:  Diet: npo, advance as tolerated  Pain/Anxiety/Delirium protocol (if indicated): none VAP protocol (if indicated): Head of bed greater than 30 degree DVT prophylaxis: Home Eliquis GI prophylaxis: Home PPI Glucose control: SSI Mobility: Bedrest Code Status: DNR but full medical care including pressors Family Communication: Resident communicated with family Disposition: Moved to ICU  LABS   PULMONARY No results for input(s): PHART, PCO2ART, PO2ART, HCO3, TCO2, O2SAT in the last 168 hours.  Invalid input(s): PCO2, PO2  CBC Recent Labs  Lab 10/03/19 0338 10/04/19 0250 10/05/19 0100  HGB 11.1* 11.6* 10.7*  HCT 36.2 38.2 34.6*  WBC 13.1* 13.7* 12.0*  PLT 171 181 144*   COAGULATION No results for input(s): INR in the last 168 hours.  CARDIAC  No results for input(s): TROPONINI in the last 168 hours. No results for input(s): PROBNP in the last 168 hours.  CHEMISTRY Recent Labs  Lab 10/02/19 0824 10/02/19 0824 10/03/19 0338 10/03/19 0338 10/04/19 0250 10/05/19 0100  NA 148*  --  146*  --  146* 147*  K 4.1   < > 3.3*   < > 3.8 3.3*  CL 112*  --  115*  --  116* 120*  CO2 24  --  25  --  21*  18*  GLUCOSE 93  --  78  --  106* 87  BUN 19  --  19  --  18 18  CREATININE 0.59  --  0.59  --  0.59 0.70  CALCIUM 9.7  --  8.4*  --  8.3* 7.4*  MG  --   --   --   --  1.7 2.1  PHOS  --   --   --   --  2.9 3.4   < > = values in this interval not displayed.   Estimated Creatinine Clearance: 48.8 mL/min (by C-G formula based on SCr of 0.7 mg/dL).  LIVER Recent Labs  Lab 10/02/19 0824 10/04/19 0250  AST 35 26  ALT 29 22  ALKPHOS 217* 162*  BILITOT 0.7 0.7  PROT 7.8 5.9*  ALBUMIN 2.2* 1.6*   INFECTIOUS Recent Labs  Lab 10/02/19 0835 10/02/19 2023 10/04/19 0250 10/05/19 0100  LATICACIDVEN 2.0* 1.5 0.7  --   PROCALCITON  --   --  0.25 0.16   ENDOCRINE  CBG (last 3)  Recent Labs    10/04/19 2028 10/05/19 0005 10/05/19 [place]   Kathi Ludwig, MD [facility], PGY-3  Please see Attending A/P and/or Addendum for final recommendations.

## 2019-10-05 NOTE — Evaluation (Signed)
Clinical/Bedside Swallow Evaluation Patient Details  Name: Sharon Velasquez MRN: [id number] Date of Birth: Jan 08, 1931  Today's Date: 10/05/2019 Time: SLP Start Time (ACUTE ONLY): [id number] SLP Stop Time (ACUTE ONLY): 1400 SLP Time Calculation (min) (ACUTE ONLY): 15 min  Past Medical History:  Past Medical History:  Diagnosis Date  . Acute renal insufficiency   . Adenocarcinoma of breast ([place])   . Aromatase inhibitor use   . Atopic dermatitis   . Cancer (HCC)    breast  . Cellulitis   . Debilitated patient   . Dysphagia   . Elevated blood pressure reading without diagnosis of hypertension   . Encounter for preventive health examination   . Esophageal reflux   . GERD (gastroesophageal reflux disease)   . Glossitis   . Hypertension   . Increased urinary frequency   . Intertrigo   . Joint pain   . Localized osteoarthritis of shoulder   . Lump or mass in breast   . Lymphedema   . Malignant neoplasm without specification of site ([place])   . Mammogram abnormal   . Morbid obesity ([place])   . Neuroendocrine tumor   . Neuroendocrine tumor   . Non-compliance   . Osteoarthritis   . Osteoarthritis   . Primary localized osteoarthritis of right hip   . Rheumatoid arthritis ([place])   . Seasonal allergies   . Subacromial bursitis   . Truncal muscle weakness    Past Surgical History: History reviewed. No pertinent surgical history. HPI:  86 year-old female with a medical history of CVA in 2017, recurrent UTIs, breast cancer, Steven-Johnson syndrome, CHF, PE on Eliquis, and MSSA bacteremia who presents to the ED from home for evaluation of AMS. Dx sepsis secondary to UTI, metabolic encephalopathy. MBS in 2017 after acute stroke revealed dysphagia with recs for nectar liquids (due to aspiration of thins) and purees. Pt has been bedbound for years; alert/conversational at baseline per notes.   Assessment / Plan / Recommendation Clinical Impression  Pt presents with dysphagia with some concern for  aspiration.  She was repositioned in bed to optimize safety; tends to have neck flexion to the left that can be corrected with positioning/towels but the effects are only temporary.  Pt responded to questions about comfort with low volume verbalizations (yes/no).  She accepted trials of water, nectar liquids, and applesauce with adequate oral attention and manipulation, palpable swallow response, consistent throat-clearing post-swallow (particularly with thin liquids, to lesser degree with nectars), no overt coughing but persistent belching.  Given pt's dependence on others for feeding, deconditioning, and altered MS, recommend initiating a dysphagia 1 diet with nectar thick liquids for now.  SLP will follow for toleration/diet progression and education as needed.   SLP Visit Diagnosis: Dysphagia, oropharyngeal phase (R13.12)    Aspiration Risk  Mild aspiration risk    Diet Recommendation   dysphagia 1, nectar thick liquid  Medication Administration: Crushed with puree    Other  Recommendations Oral Care Recommendations: Oral care BID Other Recommendations: Order thickener from pharmacy   Follow up Recommendations Other (comment)(tba)      Frequency and Duration min 2x/week  2 weeks       Prognosis Prognosis for Safe Diet Advancement: Good      Swallow Study   General HPI: 86 year-old female with a medical history of CVA in 2017, recurrent UTIs, breast cancer, Steven-Johnson syndrome, CHF, PE on Eliquis, and MSSA bacteremia who presents to the ED from home for evaluation of AMS. Dx sepsis secondary to UTI,  metabolic encephalopathy. MBS in 2017 after acute stroke revealed dysphagia with recs for nectar liquids (due to aspiration of thins) and purees. Pt has been bedbound for years; alert/conversational at baseline per notes. Type of Study: Bedside Swallow Evaluation Previous Swallow Assessment: see HPI Diet Prior to this Study: NPO Temperature Spikes Noted: No(hypothermic) Respiratory  Status: Room air History of Recent Intubation: No Behavior/Cognition: Alert Oral Cavity Assessment: Within Functional Limits Oral Care Completed by SLP: Recent completion by staff Oral Cavity - Dentition: Edentulous Self-Feeding Abilities: Total assist Patient Positioning: Upright in bed Baseline Vocal Quality: Low vocal intensity Volitional Cough: Cognitively unable to elicit Volitional Swallow: Unable to elicit    Oral/Motor/Sensory Function Overall Oral Motor/Sensory Function: Other (comment)(asymmetry left central CN VII)   Ice Chips Ice chips: Not tested   Thin Liquid Thin Liquid: Impaired Presentation: Spoon;Straw Pharyngeal  Phase Impairments: Wet Vocal Quality;Throat Clearing - Immediate    Nectar Thick Nectar Thick Liquid: Impaired Presentation: Cup;Straw Pharyngeal Phase Impairments: Throat Clearing - Immediate   Honey Thick Honey Thick Liquid: Not tested   Puree Puree: Impaired Presentation: Spoon Oral Phase Functional Implications: Prolonged oral transit Pharyngeal Phase Impairments: Throat Clearing - Immediate   Solid     Solid: Not tested      Juan Quam Laurice 10/05/2019,2:14 PM   Estill Bamberg L. Tivis Ringer, [place] Office number [phone] Pager [phone]

## 2019-10-05 NOTE — Progress Notes (Signed)
eLink Physician-Brief Progress Note Patient Name: Sharon Velasquez DOB: 1931-07-20 MRN: [id number]   Date of Service  10/05/2019  HPI/Events of Note  Hypovolemia - Currently on Norepinephrine IV infusion for hemodynamic support. CVP = 3 and albumin = 1.6.   eICU Interventions  Will order: 1. 5% albumin 12.5 gm IV now.      Intervention Category Major Interventions: Hypovolemia - evaluation and treatment with fluids  Zebbie Ace Eugene 10/05/2019, 1:04 AM

## 2019-10-05 NOTE — Progress Notes (Addendum)
HR ranging in the 40s to 50s. SB with PVCs and PACs. Pt asymptomatic. UOP remains low. Notified Harbrecht, MD. Will continue to monitor.

## 2019-10-05 NOTE — Consult Note (Signed)
[facility] Nurse Consult Note: Reason for Consult:Stage 3 sacral pressure injury.  Wound type: pressure injury, present on admission.  Pressure Injury POA: Yes Measurement: 3 cm x 2.4 cm x 0.3 cm  Wound [id number] pink moist Drainage (amount, consistency, odor) minimal serosanguinous   Periwound:intact Dressing procedure/placement/frequency: Cleanse sacral wound with NS and pat dry. Apply alginate (to wound bed) ([facility] # [id number])  Cover with sacral foam.  Change M/W/F and PRN soilage.  Will not follow at this time.  Please re-consult if needed.  Domenic Moras MSN, RN, FNP-BC CWON Wound, Ostomy, Continence Nurse Pager [phone]

## 2019-10-05 NOTE — Progress Notes (Deleted)
Pharmacy Antibiotic Note  Sharon Velasquez is a 86 y.o. female with hypothermia/hypotension, possible sepsis.  Pharmacy has been consulted for cefepime dosing.  Pt is afebrile, WBC elevated but trending down at 12, in the setting of IV steroid. SCr stable at 0.70.   Plan: Increase cefepime to 2 gm IV q8h F/u c/s, clinical improvement, renal function, and LOT  Height: 5\' 3"  (160 cm) Weight: 164 lb 3.9 oz (74.5 kg) IBW/kg (Calculated) : 52.4  Temp (24hrs), Avg:97.3 F (36.3 C), Min:95 F (35 C), Max:97.9 F (36.6 C)  Recent Labs  Lab 10/02/19 0824 10/02/19 0835 10/02/19 2023 10/03/19 0338 10/04/19 0250 10/05/19 0100  WBC 17.3*  --   --  13.1* 13.7* 12.0*  CREATININE 0.59  --   --  0.59 0.59 0.70  LATICACIDVEN  --  2.0* 1.5  --  0.7  --     Estimated Creatinine Clearance: 48.8 mL/min (by C-G formula based on SCr of 0.7 mg/dL).    Allergies  Allergen Reactions  . Latex   . 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 MAR   Antimicrobials this admission: Vanc 1/16>>1/17 Cefepime 1/16 >>  Microbiology this admission: 1/15 Resp cx: GNR, GPC >> E. coli, S to CTX 1/15 BCx: NGTD 1/15 UCx: >100K Pseudomonas, sensitivity pending 1/16 MRSA PCR: negative  Berenice Bouton, PharmD PGY1 Pharmacy Resident  Please check AMION for all [facility] phone numbers After 10:00 PM, call [place] [phone]  10/05/2019 8:43 AM

## 2019-10-05 NOTE — Progress Notes (Addendum)
Radiology called this RN to report chest x-ray results, which showed possible atelectasis and recommendations to withdraw PICC 3cm. Hyman Bible, MD. Notified IV team.

## 2019-10-05 NOTE — Progress Notes (Signed)
PICC placed with "ECG technology.

## 2019-10-05 NOTE — Progress Notes (Signed)
eLink Physician-Brief Progress Note Patient Name: Laconya Fuell DOB: Sep 26, 1930 MRN: [id number]   Date of Service  10/05/2019  HPI/Events of Note  K+ = 3.3 and Creatinine = 0.7.  eICU Interventions  Will replace K+.      Intervention Category Major Interventions: Electrolyte abnormality - evaluation and management  Valbona Slabach Eugene 10/05/2019, 1:59 AM

## 2019-10-06 DIAGNOSIS — J189 Pneumonia, unspecified organism: Secondary | ICD-10-CM

## 2019-10-06 LAB — BASIC METABOLIC PANEL
Anion gap: 8 (ref 5–15)
BUN: 22 mg/dL (ref 8–23)
CO2: 20 mmol/L — ABNORMAL LOW (ref 22–32)
Calcium: 8 mg/dL — ABNORMAL LOW (ref 8.9–10.3)
Chloride: 116 mmol/L — ABNORMAL HIGH (ref 98–111)
Creatinine, Ser: 0.74 mg/dL (ref 0.44–1.00)
GFR calc Af Amer: >60 mL/min (ref >60)
GFR calc non Af Amer: >60 mL/min (ref >60)
Glucose, Bld: 131 mg/dL — ABNORMAL HIGH (ref 70–99)
Potassium: 3.7 mmol/L (ref 3.5–5.1)
Sodium: 144 mmol/L (ref 135–145)

## 2019-10-06 LAB — CBC WITH DIFFERENTIAL/PLATELET
Abs Immature Granulocytes: 0.15 10*3/uL — ABNORMAL HIGH (ref 0.00–0.07)
Basophils Absolute: 0 10*3/uL (ref 0.0–0.1)
Basophils Relative: 0 %
Eosinophils Absolute: 0 10*3/uL (ref 0.0–0.5)
Eosinophils Relative: 0 %
HCT: 32.3 % — ABNORMAL LOW (ref 36.0–46.0)
Hemoglobin: 10.3 g/dL — ABNORMAL LOW (ref 12.0–15.0)
Immature Granulocytes: 1 %
Lymphocytes Relative: 10 %
Lymphs Abs: 1.3 10*3/uL (ref 0.7–4.0)
MCH: 28.9 pg (ref 26.0–34.0)
MCHC: 31.9 g/dL (ref 30.0–36.0)
MCV: 90.7 fL (ref 80.0–100.0)
Monocytes Absolute: 0.8 10*3/uL (ref 0.1–1.0)
Monocytes Relative: 6 %
Neutro Abs: 11 10*3/uL — ABNORMAL HIGH (ref 1.7–7.7)
Neutrophils Relative %: 83 %
Platelets: 141 10*3/uL — ABNORMAL LOW (ref 150–400)
RBC: 3.56 MIL/uL — ABNORMAL LOW (ref 3.87–5.11)
RDW: 18.6 % — ABNORMAL HIGH (ref 11.5–15.5)
WBC: 13.2 10*3/uL — ABNORMAL HIGH (ref 4.0–10.5)
nRBC: 0.7 % — ABNORMAL HIGH (ref 0.0–0.2)

## 2019-10-06 LAB — GLUCOSE, CAPILLARY
Glucose-Capillary: 120 mg/dL — ABNORMAL HIGH (ref 70–99)
Glucose-Capillary: 97 mg/dL (ref 70–99)
Glucose-Capillary: 98 mg/dL (ref 70–99)

## 2019-10-06 LAB — PHOSPHORUS: Phosphorus: 2.6 mg/dL (ref 2.5–4.6)

## 2019-10-06 LAB — MAGNESIUM: Magnesium: 2.1 mg/dL (ref 1.7–2.4)

## 2019-10-06 MED ORDER — FUROSEMIDE 10 MG/ML IJ SOLN
20.0000 mg | Freq: Once | INTRAMUSCULAR | Status: AC
  Administered 2019-10-06: 10:00:00 20 mg via INTRAVENOUS
  Filled 2019-10-06: qty 2

## 2019-10-06 MED ORDER — HYDROCORTISONE NA SUCCINATE PF 100 MG IJ SOLR
50.0000 mg | Freq: Two times a day (BID) | INTRAMUSCULAR | Status: DC
  Administered 2019-10-06: 50 mg via INTRAVENOUS
  Filled 2019-10-06: qty 2

## 2019-10-06 NOTE — Progress Notes (Signed)
NAME:  Sharon Velasquez, MRN:  [id number], DOB:  [birth year], LOS: 4 ADMISSION DATE:  10/02/2019, CONSULTATION DATE:  10/03/2019 REFERRING MD:  imts, CHIEF COMPLAINT:  Septic shock from UTI   Brief History   Patient presented with AMS of 4-5 days duration. She was found to be hypotensive with presumed septic shock from a urinary source. She was transferred to the ICU on 1/16 due to hypotension insufficiently responsive to crystalloid resuscitation.   History of present illness   Sharon Velasquez is an 86 year old bedbound female.  She has been bedbound for 3 years.  She is a DNR but full medical care including pressors.  She has a left arm /midline.  She was initially admitted with sepsis syndrome with hypothermia, high white count and abnormal urine analysis with microscopic pyuria and hematuria with positive leukocytes and ketonuria and turbid looking urine.  She was started empirically on broad spectrum antibiotics.  Over the course of her stay her hypotension progressively worsened despite fluid boluses and required low does vasopressors to maintain a perfusing blood pressure.  Apparently, at baseline, even though she is bedbound she is very conversant and alert and oriented x3, which she was not during our initial evaluation.  Her renal function has been intact.  Past Medical History    has a past medical history of Acute renal insufficiency, Adenocarcinoma of breast ([place]), Aromatase inhibitor use, Atopic dermatitis, Cancer ([place]), Cellulitis, Debilitated patient, Dysphagia, Elevated blood pressure reading without diagnosis of hypertension, Encounter for preventive health examination, Esophageal reflux, GERD (gastroesophageal reflux disease), Glossitis, Hypertension, Increased urinary frequency, Intertrigo, Joint pain, Localized osteoarthritis of shoulder, Lump or mass in breast, Lymphedema, Malignant neoplasm without specification of site [facility]), Mammogram abnormal, Morbid obesity ([place]), Neuroendocrine  tumor, Neuroendocrine tumor, Non-compliance, Osteoarthritis, Osteoarthritis, Primary localized osteoarthritis of right hip, Rheumatoid arthritis ([place]), Seasonal allergies, Subacromial bursitis, and Truncal muscle weakness.   reports that she has never smoked. She does not have any smokeless tobacco history on file.  History reviewed. No pertinent surgical history.  Allergies  Allergen Reactions  . Latex   . 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 MAR    Immunization History  Administered Date(s) Administered  . PPD Test 01/13/2016    Family History  Problem Relation Age of Onset  . Cancer Other      Current Facility-Administered Medications:  .  acetaminophen (TYLENOL) tablet 650 mg, 650 mg, Oral, Q6H PRN **OR** acetaminophen (TYLENOL) suppository 650 mg, 650 mg, Rectal, Q6H PRN, Santos-Sanchez, Idalys, MD .  albuterol (PROVENTIL) (2.5 MG/3ML) 0.083% nebulizer solution 3 mL, 3 mL, Inhalation, TID PRN, Welford Roche, MD .  apixaban (ELIQUIS) tablet 5 mg, 5 mg, Oral, BID, Santos-Sanchez, Idalys, MD, 5 mg at 10/05/19 2100 .  ceFEPIme (MAXIPIME) 2 g in sodium chloride 0.9 % 100 mL IVPB, 2 g, Intravenous, Q12H, [place]en[place]k, Yujing Z, RPH, Stopped at 10/05/19 2133 .  chlorhexidine (PERIDEX) 0.12 % solution 15 mL, 15 mL, Mouth Rinse, BID, Candee Furbish, MD, 15 mL at 10/05/19 2057 .  Chlorhexidine Gluconate Cloth 2 % PADS 6 each, 6 each, Topical, Daily, Kipp Brood, MD, 6 each at 10/05/19 1455 .  dextrose 5 % solution, , Intravenous, Continuous, Kathi Ludwig, MD, Stopped at 10/06/19 [phone] .  hydrocortisone sodium succinate (SOLU-CORTEF) 100 MG injection 50 mg, 50 mg, Intravenous, Q8H, Kamen Hanken, MD, 50 mg at 10/06/19 0008 .  lactated ringers infusion, , Intravenous, Continuous, Sood, Vineet, MD, Last Rate: 50 mL/hr at 10/06/19 0601, New Bag  at 10/06/19 0601 .  lip balm (CARMEX) ointment, , Topical, PRN, Candee Furbish, MD .  MEDLINE mouth rinse, 15  mL, Mouth Rinse, q12n4p, Candee Furbish, MD, 15 mL at 10/05/19 1600 .  norepinephrine (LEVOPHED) 4mg  in 246mL premix infusion, 0-40 mcg/min, Intravenous, Titrated, Agyei, Obed K, MD, Last Rate: 15 mL/hr at 10/06/19 0600, 4 mcg/min at 10/06/19 0600 .  pantoprazole (PROTONIX) EC tablet 40 mg, 40 mg, Oral, Daily, Santos-Sanchez, Idalys, MD, 40 mg at 10/05/19 0936 .  pravastatin (PRAVACHOL) tablet 20 mg, 20 mg, Oral, q1800, Santos-Sanchez, Idalys, MD, 20 mg at 10/05/19 1746 .  sodium chloride flush (NS) 0.9 % injection 10-40 mL, 10-40 mL, Intracatheter, Q12H, Hoffman, Erik C, DO, 20 mL at 10/04/19 2200 .  sodium chloride flush (NS) 0.9 % injection 10-40 mL, 10-40 mL, Intracatheter, PRN, [place], Erik C, DO .  sodium chloride flush (NS) 0.9 % injection 10-40 mL, 10-40 mL, Intracatheter, Q12H, Ramaswamy, Murali, MD, 20 mL at 10/04/19 2200 .  sodium chloride flush (NS) 0.9 % injection 10-40 mL, 10-40 mL, Intracatheter, PRN, Brand Males, MD .  zinc oxide (BALMEX) 99991111 % cream 1 application, 1 application, Topical, Daily PRN, Santos-Sanchez, Idalys, MD   ROS  Difficult to obtain due to medical illness limiting mental status. Patient malaise, myalgias, chest pain, dyspnea, dysuria or headache.   Significant Hospital Events   10/02/2019 - admit 1/16 - Transferred to the ICU  Consults:  PCCM  Procedures:  1/16-Midline placement  Significant Diagnostic Tests:  N/A  Micro Data:  1/15- blood - ntd 1/15 - covid  -neg 1/.15- flu neg 1/15 - sputum - mixed 1/15 - Urine - Pseudomonas aeruginosa  1/15 - Respiratory - abundant E. Coli   Antimicrobials:  1/15 - azithromycin >> 1/15 1/16 - vanc >> 1/17 1/16 - ceftriaxone >> 1/15 1/16 - cefepime >> (Last day 10/09/19)  Interim history/subjective:  Patient less oriented today than the prior day but this may be to some degree related to word finding difficulties. She is able to state that she is not in pain.   Objective   Blood pressure (!)  105/53, pulse (!) 46, temperature 97.9 F (36.6 C), resp. rate 15, height 5\' 3"  (1.6 m), weight 76.7 kg, SpO2 99 %. CVP:  [2 mmHg-8 mmHg] 8 mmHg      Intake/Output Summary (Last 24 hours) at 10/06/2019 0651 Last data filed at 10/06/2019 0600 Gross per 24 hour  Intake 2611.66 ml  Output 340 ml  Net 2271.66 ml   Filed Weights   10/04/19 0500 10/05/19 0500 10/06/19 0500  Weight: 71.5 kg 74.5 kg 76.7 kg   Examination: General: A/O x1, in no acute distress, afebrile, nondiaphoretic HEENT: PEERL, EMO intact Cardio: Irregular rhythm with bradycardia, no mrg's  Pulmonary: No wheezing but marked rhonchi are persistent bilaterally and a productive cough is noted Abdomen: Bowel sounds normal, soft, nontender  MSK: BLE nontender, nonedematous Neuro: Alert, CNII-XII grossly intact, strength 4/5 in the upper extremities Psych: Appropriate affect, not depressed in appearance, engages well  I/O: 2.6L in, 0.340L out-total for admit +7.6L and weight up 5+ kg  Resolved Hospital Problem list   N/A  Assessment & Plan:  Distributive shock likely 2/2 to sepsis: UTI most likely source although aspiration pneumonia is possible: Hypothermia: Patient with slight improvement. Persistently hypothermic although this has improved greatly. She remains somewhat hypotensive and has a persistently elevated white count. E. Coli in respiratory culture cooncerning for aspiration pneumonia in the setting of CVA. No  focal consolidation on imaging but we are unable to visualize the LLL due to patients positioning on CXR. Plan -Urine culture growing Pseudomonas aeruginosa with resp cul demonstrating E. coli -Continue cefepime with last day as of 10/09/19 -Continue warming per protocol -Titrate levophed to maintain MAP >65 or Sys BP >110 -Slow taper of stress dose steroids to Q12 hours today and off tomorrow  Chronic systolic heart failure with ejection fraction 30-35% and diffuse hypokinesis: Currently appears  slightly hypervolemic on exam despite several fluid boluses and net positive 7.5L since admission. Saturating 99% on RA. Plan -Low dose of 20mg  lasix today -Discontinue maintenance fluids -Monitor volume status closely given fluid resuscitation and septic shock  History of pulmonary embolism on Eliquis 5 mg daily at home Currently maintaining respiratory status. No indication of recurrent PE. Plan -Continue home Eliquis  Sacral Ulcer: Small 3x2.4 cm ulceration. Seen by wound care.  Plan -Continue wound care  Hx of CVA: Residual deficits are noted with weakness in all four extremties and being bed bound since event.  Plan -Continue statin and Eliquis  -Continue close monitoring as she is at risk for delirium  At risk for renal injury sCr stable despite poor urine output.  Plan -Monitor closely with hydration  At risk for electrolyte imbalance Low K - repleted Na elevated to 147, will avoid NS boluses. Plan -Correct as needed -Repleted Mag and potassium   Anemia: Hgb stable. Plan -Monitor  At risk for hyper and hypoglycemia and illness Hypothermic Plan -SSI -Bair hugger  Best practice:  Diet: Dysphagia 1 Pain/Anxiety/Delirium protocol (if indicated): none VAP protocol (if indicated): Head of bed greater than 30 degree DVT prophylaxis: Home Eliquis GI prophylaxis: Home PPI Glucose control: SSI Mobility: Bedrest Code Status: DNR but full medical care including pressors Family Communication: Resident communicated with family Disposition: Moved to ICU  LABS   PULMONARY No results for input(s): PHART, PCO2ART, PO2ART, HCO3, TCO2, O2SAT in the last 168 hours.  Invalid input(s): PCO2, PO2  CBC Recent Labs  Lab 10/04/19 0250 10/05/19 0100 10/06/19 0245  HGB 11.6* 10.7* 10.3*  HCT 38.2 34.6* 32.3*  WBC 13.7* 12.0* 13.2*  PLT 181 144* 141*   COAGULATION No results for input(s): INR in the last 168 hours.  CARDIAC  No results for input(s): TROPONINI  in the last 168 hours. No results for input(s): PROBNP in the last 168 hours.  CHEMISTRY Recent Labs  Lab 10/02/19 0824 10/02/19 0824 10/03/19 0338 10/03/19 0338 10/04/19 0250 10/04/19 0250 10/05/19 0100 10/06/19 0245  NA 148*  --  146*  --  146*  --  147* 144  K 4.1   < > 3.3*   < > 3.8   < > 3.3* 3.7  CL 112*  --  115*  --  116*  --  120* 116*  CO2 24  --  25  --  21*  --  18* 20*  GLUCOSE 93  --  78  --  106*  --  87 131*  BUN 19  --  19  --  18  --  18 22  CREATININE 0.59  --  0.59  --  0.59  --  0.70 0.74  CALCIUM 9.7  --  8.4*  --  8.3*  --  7.4* 8.0*  MG  --   --   --   --  1.7  --  2.1 2.1  PHOS  --   --   --   --  2.9  --  3.4 2.6   < > = values in this interval not displayed.   Estimated Creatinine Clearance: 49.5 mL/min (by C-G formula based on SCr of 0.74 mg/dL).  LIVER Recent Labs  Lab 10/02/19 0824 10/04/19 0250  AST 35 26  ALT 29 22  ALKPHOS 217* 162*  BILITOT 0.7 0.7  PROT 7.8 5.9*  ALBUMIN 2.2* 1.6*   INFECTIOUS Recent Labs  Lab 10/02/19 0835 10/02/19 2023 10/04/19 0250 10/05/19 0100  LATICACIDVEN 2.0* 1.5 0.7  --   PROCALCITON  --   --  0.25 0.16   ENDOCRINE CBG (last 3)  Recent Labs    10/05/19 1640 10/05/19 2108 10/06/19 [place], MD [facility], PGY-3  Please see Attending A/P and/or Addendum for final recommendations.

## 2019-10-06 NOTE — Plan of Care (Signed)

## 2019-10-06 NOTE — Evaluation (Addendum)
Occupational Therapy Evaluation Patient Details Name: Sharon Velasquez MRN: [id number] DOB: [birth year] Today's Date: 10/06/2019    History of Present Illness Patient is an 86 y/o female who presented with AMS of 4-5 days duration. She was found to be hypotensive with presumed septic shock from a urinary source. She was transferred to the ICU on 1/16 due to hypotension insufficiently responsive to crystalloid resuscitation. PMHx includes acute renal insufficiency, breast CA, dysphagia, HTN, RA, CVA in 2017, recurrent UTIs   Clinical Impression   This 86 y/o female presents with the above. PLOF obtained via chart review, partly via pt report though pt with congitive impairments, delayed processing/response time and at times perseverating on certain responses/phrases. Per chart pt has been bed bound for approx past 3 years, pt reports she does get up to a wheelchair (question accuracy), receiving assist for ADL completion. Pt presents with notable weakness, decreased UE ROM and fine motor function. Pt requiring totalA+2 for bed mobility attempts and repositioning at bed level. Pt noted to have digits of bil hands mostly fixed in extension and edema in bil hands. Pt following simple commands intermittently during session given cues. Suspect pt likely close to or at her baseline, recommend use of hoyer for mobility OOB as pt able to tolerate. Recommend pt return home with continued assist/support from family pending they are able to continue providing necessary care. No further acute OT needs identified, will sign off at this time. Please re-consul should pt's needs change. Thank you for this referral.    Follow Up Recommendations  Supervision/Assistance - 24 hour;No OT follow up    Equipment Recommendations  None recommended by OT           Precautions / Restrictions Precautions Precautions: Fall Restrictions Weight Bearing Restrictions: No      Mobility Bed Mobility Overal bed mobility:  Needs Assistance Bed Mobility: Rolling Rolling: Total assist;+2 for physical assistance;+2 for safety/equipment         General bed mobility comments: totalA for attempts to roll towards L and R, pt grimacing, moaning with attempts so deferred further mobility   Transfers                 General transfer comment: deferred, will need use of maximove     Balance                                           ADL either performed or assessed with clinical judgement   ADL Overall ADL's : Needs assistance/impaired                                       General ADL Comments: pt overall totalA for ADL     Vision         Perception     Praxis      Pertinent Vitals/Pain Pain Assessment: Faces Faces Pain Scale: Hurts even more Pain Location: generalized with bed mobility attempts (pt noted with stage 3 sacral wound)  Pain Descriptors / Indicators: Discomfort;Grimacing;Moaning Pain Intervention(s): Limited activity within patient's tolerance;Monitored during session;Repositioned     Hand Dominance     Extremity/Trunk Assessment Upper Extremity Assessment Upper Extremity Assessment: Generalized weakness;RUE deficits/detail;LUE deficits/detail RUE Deficits / Details: digits noted fixed in extension, edema in bil hands, only minimally able to passively  flex, limited shoulder ROM; grossly 2-/5 throughout RUE Coordination: decreased fine motor;decreased gross motor LUE Deficits / Details: digits noted fixed in extension, edema in bil hands, only minimally able to passively flex, limited shoulder ROM; grossly 2-/5 throughout LUE Coordination: decreased fine motor;decreased gross motor   Lower Extremity Assessment Lower Extremity Assessment: Defer to PT evaluation       Communication Communication Communication: Expressive difficulties   Cognition Arousal/Alertness: Awake/alert Behavior During Therapy: Flat affect Overall Cognitive  Status: No family/caregiver present to determine baseline cognitive functioning                                 General Comments: pt intermittently following simple/basic commands, delayed processing with response to questions, able to state most of her DOB, at times perseverating on certain responses   General Comments  VSS     Exercises     Shoulder Instructions      Home Living Family/patient expects to be discharged to:: Private residence Living Arrangements: Children                           Home Equipment: Wheelchair - manual   Additional Comments: per chart pt in from home with daughter       Prior Functioning/Environment Level of Independence: Needs assistance  Gait / Transfers Assistance Needed: per chart pt has been bedbound approx 50yrs, pt reports she gets up to wheelchair but not able to specify how (suspect hoyer)  ADL's / Homemaking Assistance Needed: assist for ADL            OT Problem List: Decreased strength;Decreased range of motion;Decreased activity tolerance;Impaired balance (sitting and/or standing);Decreased cognition;Decreased safety awareness;Pain;Impaired UE functional use;Increased edema      OT Treatment/Interventions:      OT Goals(Current goals can be found in the care plan section) Acute Rehab OT Goals Patient Stated Goal: none stated OT Goal Formulation: All assessment and education complete, DC therapy  OT Frequency:     Barriers to D/C:            Co-evaluation PT/OT/SLP Co-Evaluation/Treatment: Yes Reason for Co-Treatment: Complexity of the patient's impairments (multi-system involvement);For patient/therapist safety;Necessary to address cognition/behavior during functional activity   OT goals addressed during session: Strengthening/ROM;ADL's and self-care      AM-PAC OT "6 Clicks" Daily Activity     Outcome Measure Help from another person eating meals?: Total Help from another person taking care  of personal grooming?: Total Help from another person toileting, which includes using toliet, bedpan, or urinal?: Total Help from another person bathing (including washing, rinsing, drying)?: Total Help from another person to put on and taking off regular upper body clothing?: Total Help from another person to put on and taking off regular lower body clothing?: Total 6 Click Score: 6   End of Session Nurse Communication: Mobility status  Activity Tolerance: Patient tolerated treatment well Patient left: in bed;with call bell/phone within reach  OT Visit Diagnosis: Muscle weakness (generalized) (M62.81);Other abnormalities of gait and mobility (R26.89);Other symptoms and signs involving cognitive function;Cognitive communication deficit (R41.841)                Time: [id number] OT Time Calculation (min): 15 min Charges:  OT General Charges $OT Visit: 1 Visit OT Evaluation $OT Eval Moderate Complexity: [place], OT [employer] Pager [phone] Office [place]  10/06/2019, 12:23 PM

## 2019-10-06 NOTE — Progress Notes (Signed)
  Speech Language Pathology Treatment: Dysphagia  Patient Details Name: Sharon Velasquez MRN: [id number] DOB: [birth year] Today's Date: 10/06/2019 Time: 1050-1100 SLP Time Calculation (min) (ACUTE ONLY): 10 min  Assessment / Plan / Recommendation Clinical Impression  Pt has had minimal consumption of PO since initiating diet yesterday, found with oral residue from breakfast pocketed on left. When repositioned and liquids attempted, pt required total assist feeding, could not sip from straw or seal lips to cup. There was severe anterior spillage and pt had prolonged oral pumping. There was not definitive swallow response elicited and pt had prolonged throat clearing progressing to coughing. Recommend NPO at this time. Pts response to PO will need to improve before further attempting oral intake, though overall prognosis for safe swallowing and adequate nutrition is poor.   HPI HPI: 86 year-old female with a medical history of CVA in 2017, recurrent UTIs, breast cancer, Steven-Johnson syndrome, CHF, PE on Eliquis, and MSSA bacteremia who presents to the ED from home for evaluation of AMS. Dx sepsis secondary to UTI, metabolic encephalopathy. MBS in 2017 after acute stroke revealed dysphagia with recs for nectar liquids (due to aspiration of thins) and purees. Pt has been bedbound for years; alert/conversational at baseline per notes.      SLP Plan  Continue with current plan of care       Recommendations  Diet recommendations: NPO                Plan: Continue with current plan of care       GO               Sharon [place], MA [place] Pager [phone] Office [phone]  Lynann Beaver 10/06/2019, 12:58 PM

## 2019-10-06 NOTE — Evaluation (Signed)
Physical Therapy Evaluation Patient Details Name: Sharon Velasquez MRN: [id number] DOB: 07-15-1931 Today's Date: 10/06/2019   History of Present Illness  Patient is an 86 y/o female who presented with AMS of 4-5 days duration. She was found to be hypotensive with presumed septic shock from a urinary source. She was transferred to the ICU on 1/16 due to hypotension insufficiently responsive to crystalloid resuscitation. PMHx includes acute renal insufficiency, breast CA, dysphagia, HTN, RA, CVA in 2017, recurrent UTIs  Clinical Impression  Patient presents with generalized weakness, decreased AROM BUEs/LEs, cognitive impairments and overall impaired mobility. Per chart, pt lives with family and has been bed bound for ~3 years since 2017. Pt reports she gets in her w/c (not able to state how), likely hoyer lift. Also pt likely total A for ADLs at baseline. Requires total A of 2 for attempted rolling but unable to tolerate due to pain and no initiation of movement. Pt noted to have delayed processing/response time and at times perseverating on certain responses/phrases during session. Recommend nursing use hoyer lift for transfer to chair. Recommend return home with continued support from family if able to provide necessary level of care. Pt appears to be at functional baseline and does not require acute PT services. Discharge from therapy. Please re consult should pt's need change. Thanks.    Follow Up Recommendations No PT follow up;Supervision for mobility/OOB    Equipment Recommendations  None recommended by PT    Recommendations for Other Services       Precautions / Restrictions Precautions Precautions: Fall Precaution Comments: Prevalon boots; pocketing food Restrictions Weight Bearing Restrictions: No      Mobility  Bed Mobility Overal bed mobility: Needs Assistance Bed Mobility: Rolling Rolling: Total assist;+2 for physical assistance;+2 for safety/equipment         General  bed mobility comments: totalA for attempts to roll towards L and R, pt grimacing, moaning with attempts so deferred further mobility. no initiation to assist with movement.  Transfers                 General transfer comment: deferred, will need use of maximove   Ambulation/Gait                Stairs            Wheelchair Mobility    Modified Rankin (Stroke Patients Only)       Balance                                             Pertinent Vitals/Pain Pain Assessment: Faces Faces Pain Scale: Hurts even more Pain Location: generalized with bed mobility attempts (pt noted with stage 3 sacral wound)  Pain Descriptors / Indicators: Discomfort;Grimacing;Moaning Pain Intervention(s): Limited activity within patient's tolerance;Repositioned;Monitored during session    Home Living Family/patient expects to be discharged to:: Private residence Living Arrangements: Children             Home Equipment: Wheelchair - manual Additional Comments: per chart pt in from home with daughter     Prior Function Level of Independence: Needs assistance   Gait / Transfers Assistance Needed: per chart pt has been bedbound approx 6yrs, pt reports she gets up to wheelchair but not able to specify how (suspect hoyer)   ADL's / Homemaking Assistance Needed: assist for ADL        Hand Dominance  Extremity/Trunk Assessment   Upper Extremity Assessment Upper Extremity Assessment: Defer to OT evaluation RUE Deficits / Details: digits noted fixed in extension, edema in bil hands, only minimally able to passively flex, limited shoulder ROM; grossly 2-/5 throughout RUE Coordination: decreased fine motor;decreased gross motor LUE Deficits / Details: digits noted fixed in extension, edema in bil hands, only minimally able to passively flex, limited shoulder ROM; grossly 2-/5 throughout LUE Coordination: decreased fine motor;decreased gross motor     Lower Extremity Assessment Lower Extremity Assessment: RLE deficits/detail;LLE deficits/detail;Difficult to assess due to impaired cognition RLE Deficits / Details: Able to wiggle toes, slight ankle AROM- LEs positioned into tibial external rotation; limited knee flexion due to pain/likely contractures RLE Coordination: decreased fine motor;decreased gross motor LLE Deficits / Details: Able to wiggle toes, slight ankle AROM- LEs positioned into tibial external rotation; limited knee flexion due to pain/likely contractures LLE Coordination: decreased fine motor;decreased gross motor    Cervical / Trunk Assessment Cervical / Trunk Assessment: Other exceptions Cervical / Trunk Exceptions: favors cervical left rotation  Communication   Communication: Expressive difficulties  Cognition Arousal/Alertness: Awake/alert Behavior During Therapy: Flat affect Overall Cognitive Status: No family/caregiver present to determine baseline cognitive functioning                                 General Comments: pt intermittently following simple/basic commands, delayed processing with response to questions, able to state most of her DOB, at times perseverating on certain responses      General Comments General comments (skin integrity, edema, etc.): VSS    Exercises     Assessment/Plan    PT Assessment Patent does not need any further PT services  PT Problem List         PT Treatment Interventions      PT Goals (Current goals can be found in the Care Plan section)  Acute Rehab PT Goals Patient Stated Goal: none stated PT Goal Formulation: All assessment and education complete, DC therapy    Frequency     Barriers to discharge        Co-evaluation PT/OT/SLP Co-Evaluation/Treatment: Yes Reason for Co-Treatment: Complexity of the patient's impairments (multi-system involvement);For patient/therapist safety;Necessary to address cognition/behavior during functional  activity PT goals addressed during session: Strengthening/ROM;Mobility/safety with mobility OT goals addressed during session: Strengthening/ROM;ADL's and self-care       AM-PAC PT "6 Clicks" Mobility  Outcome Measure Help needed turning from your back to your side while in a flat bed without using bedrails?: Total Help needed moving from lying on your back to sitting on the side of a flat bed without using bedrails?: Total Help needed moving to and from a bed to a chair (including a wheelchair)?: Total Help needed standing up from a chair using your arms (e.g., wheelchair or bedside chair)?: Total Help needed to walk in hospital room?: Total Help needed climbing 3-5 steps with a railing? : Total 6 Click Score: 6    End of Session   Activity Tolerance: Patient limited by pain Patient left: in bed;with call bell/phone within reach;with bed alarm set Nurse Communication: Mobility status;Need for lift equipment PT Visit Diagnosis: Muscle weakness (generalized) (M62.81)    Time: [id number] PT Time Calculation (min) (ACUTE ONLY): 16 min   Charges:   PT Evaluation $PT Eval Moderate Complexity: 1 Mod          Marisa Severin, PT, DPT Acute Rehabilitation Services Pager [phone] Office  Calcium Horrace [place] 10/06/2019, 2:04 PM

## 2019-10-07 ENCOUNTER — Encounter (HOSPITAL_COMMUNITY): Payer: Self-pay | Admitting: Internal Medicine

## 2019-10-07 LAB — CULTURE, BLOOD (ROUTINE X 2)
Culture: NO GROWTH
Culture: NO GROWTH

## 2019-10-07 LAB — CBC WITH DIFFERENTIAL/PLATELET
Abs Immature Granulocytes: 0.06 10*3/uL (ref 0.00–0.07)
Basophils Absolute: 0 10*3/uL (ref 0.0–0.1)
Basophils Relative: 0 %
Eosinophils Absolute: 0 10*3/uL (ref 0.0–0.5)
Eosinophils Relative: 0 %
HCT: 33.5 % — ABNORMAL LOW (ref 36.0–46.0)
Hemoglobin: 10.6 g/dL — ABNORMAL LOW (ref 12.0–15.0)
Immature Granulocytes: 1 %
Lymphocytes Relative: 12 %
Lymphs Abs: 1.3 10*3/uL (ref 0.7–4.0)
MCH: 28.5 pg (ref 26.0–34.0)
MCHC: 31.6 g/dL (ref 30.0–36.0)
MCV: 90.1 fL (ref 80.0–100.0)
Monocytes Absolute: 0.7 10*3/uL (ref 0.1–1.0)
Monocytes Relative: 7 %
Neutro Abs: 8.9 10*3/uL — ABNORMAL HIGH (ref 1.7–7.7)
Neutrophils Relative %: 80 %
Platelets: 125 10*3/uL — ABNORMAL LOW (ref 150–400)
RBC: 3.72 MIL/uL — ABNORMAL LOW (ref 3.87–5.11)
RDW: 18.3 % — ABNORMAL HIGH (ref 11.5–15.5)
WBC: 10.9 10*3/uL — ABNORMAL HIGH (ref 4.0–10.5)
nRBC: 0.5 % — ABNORMAL HIGH (ref 0.0–0.2)

## 2019-10-07 LAB — BASIC METABOLIC PANEL
Anion gap: 5 (ref 5–15)
BUN: 26 mg/dL — ABNORMAL HIGH (ref 8–23)
CO2: 21 mmol/L — ABNORMAL LOW (ref 22–32)
Calcium: 8.2 mg/dL — ABNORMAL LOW (ref 8.9–10.3)
Chloride: 117 mmol/L — ABNORMAL HIGH (ref 98–111)
Creatinine, Ser: 0.81 mg/dL (ref 0.44–1.00)
GFR calc Af Amer: >60 mL/min (ref >60)
GFR calc non Af Amer: >60 mL/min (ref >60)
Glucose, Bld: 97 mg/dL (ref 70–99)
Potassium: 3.1 mmol/L — ABNORMAL LOW (ref 3.5–5.1)
Sodium: 143 mmol/L (ref 135–145)

## 2019-10-07 LAB — GLUCOSE, CAPILLARY: Glucose-Capillary: 85 mg/dL (ref 70–99)

## 2019-10-07 LAB — PHOSPHORUS: Phosphorus: 2.5 mg/dL (ref 2.5–4.6)

## 2019-10-07 LAB — MAGNESIUM: Magnesium: 2.1 mg/dL (ref 1.7–2.4)

## 2019-10-07 MED ORDER — POTASSIUM CHLORIDE 10 MEQ/100ML IV SOLN
10.0000 meq | INTRAVENOUS | Status: AC
  Administered 2019-10-07 (×3): 10 meq via INTRAVENOUS
  Filled 2019-10-07 (×3): qty 100

## 2019-10-07 MED ORDER — HYDROCORTISONE NA SUCCINATE PF 100 MG IJ SOLR
50.0000 mg | Freq: Two times a day (BID) | INTRAMUSCULAR | Status: AC
  Administered 2019-10-07: 08:00:00 50 mg via INTRAVENOUS
  Filled 2019-10-07: qty 2

## 2019-10-07 MED ORDER — DEXTROSE 5 % IV SOLN: INTRAVENOUS | Status: DC

## 2019-10-07 NOTE — Progress Notes (Signed)
  Speech Language Pathology Treatment: Dysphagia  Patient Details Name: Sharon Velasquez MRN: [id number] DOB: 1931/01/04 Today's Date: 10/07/2019 Time: 1000-1015 SLP Time Calculation (min) (ACUTE ONLY): 15 min  Assessment / Plan / Recommendation Clinical Impression  Pts mentation much improved today. After repositioning pt able to attend to PO intake, follow one step commands, attempts to verbalize but is unintelligible. Pt accepted 3 bites of pudding; first bite went well, second bite elicited grimacing and throat clear, but the third pt was gagging and visibly struggling. No improvement with teaspoons of thickened liquids. Attempted to contact pts daughter to discuss any baseline dysphagia, given that this appears esophageal in nature. No response. Though mentation is improving dysphagia appears quite severe. Will f/u with MBS tomorrow for instrumental assessment. Unsure if pt will tolerate procedure, but will attempt for best recommendations unless plan of care transitions to comfort.    HPI HPI: 86 year-old female with a medical history of CVA in 2017, recurrent UTIs, breast cancer, Steven-Johnson syndrome, CHF, PE on Eliquis, and MSSA bacteremia who presents to the ED from home for evaluation of AMS. Dx sepsis secondary to UTI, metabolic encephalopathy. MBS in 2017 after acute stroke revealed dysphagia with recs for nectar liquids (due to aspiration of thins) and purees. Pt has been bedbound for years; alert/conversational at baseline per notes.      SLP Plan  Continue with current plan of care       Recommendations  Diet recommendations: NPO                Follow up Recommendations: Skilled Nursing facility SLP Visit Diagnosis: Dysphagia, unspecified (R13.10) Plan: Continue with current plan of care       GO               Herbie [place], MA [place] Pager [phone] Office [phone]  Lynann Beaver 10/07/2019, 12:10  PM

## 2019-10-07 NOTE — Progress Notes (Addendum)
NAME:  Sharon Velasquez, MRN:  [id number], DOB:  Feb 07, 1931, LOS: 5 ADMISSION DATE:  10/02/2019, CONSULTATION DATE:  10/03/2019 REFERRING MD:  imts, CHIEF COMPLAINT:  Septic shock from UTI   Brief History   Patient presented with AMS of 4-5 days duration. She was found to be hypotensive with presumed septic shock from a urinary source. She was transferred to the ICU on 1/16 due to hypotension insufficiently responsive to crystalloid resuscitation.   History of present illness   Sharon Velasquez is an 86 year old bedbound female.  She has been bedbound for 3 years.  She is a DNR but full medical care including pressors.  She has a left arm /midline.  She was initially admitted with sepsis syndrome with hypothermia, high white count and abnormal urine analysis with microscopic pyuria and hematuria with positive leukocytes and ketonuria and turbid looking urine.  She was started empirically on broad spectrum antibiotics.  Over the course of her stay her hypotension progressively worsened despite fluid boluses and required low does vasopressors to maintain a perfusing blood pressure.  Apparently, at baseline, even though she is bedbound she is very conversant and alert and oriented x3, which she was not during our initial evaluation.  Her renal function has been intact.  Past Medical History    has a past medical history of Acute renal insufficiency, Adenocarcinoma of breast ([place]), Aromatase inhibitor use, Atopic dermatitis, Cancer ([place]), Cellulitis, Debilitated patient, Dysphagia, Elevated blood pressure reading without diagnosis of hypertension, Encounter for preventive health examination, Esophageal reflux, GERD (gastroesophageal reflux disease), Glossitis, Hypertension, Increased urinary frequency, Intertrigo, Joint pain, Localized osteoarthritis of shoulder, Lump or mass in breast, Lymphedema, Malignant neoplasm without specification of site [facility]), Mammogram abnormal, Morbid obesity ([place]), Neuroendocrine  tumor, Neuroendocrine tumor, Non-compliance, Osteoarthritis, Osteoarthritis, Primary localized osteoarthritis of right hip, Rheumatoid arthritis ([place]), Seasonal allergies, Subacromial bursitis, and Truncal muscle weakness.   reports that she has never smoked. She does not have any smokeless tobacco history on file.  History reviewed. No pertinent surgical history.  Allergies  Allergen Reactions  . Latex   . 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 MAR    Immunization History  Administered Date(s) Administered  . PPD Test 01/13/2016    Family History  Problem Relation Age of Onset  . Cancer Other      Current Facility-Administered Medications:  .  acetaminophen (TYLENOL) tablet 650 mg, 650 mg, Oral, Q6H PRN **OR** acetaminophen (TYLENOL) suppository 650 mg, 650 mg, Rectal, Q6H PRN, Santos-Sanchez, Idalys, MD .  albuterol (PROVENTIL) (2.5 MG/3ML) 0.083% nebulizer solution 3 mL, 3 mL, Inhalation, TID PRN, Welford Roche, MD .  apixaban (ELIQUIS) tablet 5 mg, 5 mg, Oral, BID, Santos-Sanchez, Idalys, MD, 5 mg at 10/06/19 2107 .  ceFEPIme (MAXIPIME) 2 g in sodium chloride 0.9 % 100 mL IVPB, 2 g, Intravenous, Q12H, [place], Yujing Z, RPH, Last Rate: 200 mL/hr at 10/06/19 2105, 2 g at 10/06/19 2105 .  chlorhexidine (PERIDEX) 0.12 % solution 15 mL, 15 mL, Mouth Rinse, BID, Candee Furbish, MD, 15 mL at 10/06/19 2107 .  Chlorhexidine Gluconate Cloth 2 % PADS 6 each, 6 each, Topical, Daily, Agarwala, Ravi, MD, 6 each at 10/06/19 1613 .  hydrocortisone sodium succinate (SOLU-CORTEF) 100 MG injection 50 mg, 50 mg, Intravenous, Q12H, Rigoberto Noel, MD, 50 mg at 10/06/19 2033 .  lip balm (CARMEX) ointment, , Topical, PRN, Candee Furbish, MD .  MEDLINE mouth rinse, 15 mL, Mouth Rinse, q12n4p, Candee Furbish, MD,  15 mL at 10/06/19 1600 .  norepinephrine (LEVOPHED) 4mg  in 240mL premix infusion, 0-40 mcg/min, Intravenous, Titrated, Jean Rosenthal, MD, Stopped at 10/06/19 1448 .   pantoprazole (PROTONIX) EC tablet 40 mg, 40 mg, Oral, Daily, Santos-Sanchez, Idalys, MD, 40 mg at 10/06/19 0935 .  potassium chloride 10 mEq in 100 mL IVPB, 10 mEq, Intravenous, Q1 Hr x 3, Ogan, Okoronkwo U, MD .  pravastatin (PRAVACHOL) tablet 20 mg, 20 mg, Oral, q1800, Santos-Sanchez, Idalys, MD, 20 mg at 10/05/19 1746 .  sodium chloride flush (NS) 0.9 % injection 10-40 mL, 10-40 mL, Intracatheter, Q12H, Hoffman, Erik C, DO, 10 mL at 10/06/19 0936 .  sodium chloride flush (NS) 0.9 % injection 10-40 mL, 10-40 mL, Intracatheter, PRN, [place], Erik C, DO .  sodium chloride flush (NS) 0.9 % injection 10-40 mL, 10-40 mL, Intracatheter, Q12H, Brand Males, MD, 10 mL at 10/06/19 2108 .  sodium chloride flush (NS) 0.9 % injection 10-40 mL, 10-40 mL, Intracatheter, PRN, Brand Males, MD .  zinc oxide (BALMEX) 99991111 % cream 1 application, 1 application, Topical, Daily PRN, Santos-Sanchez, Idalys, MD   ROS  Difficult to obtain due to medical illness limiting mental status. Patient malaise, myalgias, chest pain, dyspnea, dysuria or headache.   Significant Hospital Events   10/02/2019 - admit 1/16 - Transferred to the ICU  Consults:  PCCM  Procedures:  1/16-Midline placement  Significant Diagnostic Tests:  N/A  Micro Data:  1/15- blood - ntd 1/15 - covid  -neg 1/.15- flu neg 1/15 - sputum - mixed 1/15 - Urine - Pseudomonas aeruginosa  1/15 - Respiratory - abundant E. Coli   Antimicrobials:  1/15 - azithromycin >> 1/15 1/16 - vanc >> 1/17 1/16 - ceftriaxone >> 1/15 1/16 - cefepime >> (Last day 10/09/19)  Interim history/subjective:  Patient less oriented today than the prior day but this may be to some degree related to word finding difficulties. She is able to state that she is not in pain.   Objective   Blood pressure (!) 127/57, pulse (!) 55, temperature (!) 97.3 F (36.3 C), temperature source Axillary, resp. rate 14, height 5\' 3"  (1.6 m), weight 76 kg, SpO2 99 %.          Intake/Output Summary (Last 24 hours) at 10/07/2019 0657 Last data filed at 10/07/2019 0500 Gross per 24 hour  Intake 380.7 ml  Output 1270 ml  Net -889.3 ml   Filed Weights   10/05/19 0500 10/06/19 0500 10/07/19 0500  Weight: 74.5 kg 76.7 kg 76 kg   Examination: General: Alert but oriented only to self, in no acute distress, afebrile, nondiaphoretic HEENT: PEERL, EMO intact Cardio: RRR, no mrg's  Pulmonary: CTA bilaterally, no wheezing or crackles but due to patients positioning the exam is limited Abdomen: Bowel sounds normal, soft, nontender  MSK: BLE nontender, mildly edematous Neuro: Alert, moves all four extremities on command Psych: Appropriate affect, not depressed in appearance, engages well, cooperative  I/O: 301cc in, 1.3L out-Total for admit +6.7L and weight up 5+ kg  Resolved Hospital Problem list   N/A  Assessment & Plan:  Distributive shock likely 2/2 to sepsis: UTI most likely source although aspiration pneumonia is possible: Hypothermia: Patient with slight improvement again today as she is no longer hypothermic or requiring pressors. She has a persistently elevated white count but this has almost resolved and could partially be due to steroids E. Coli in respiratory culture cooncerning for aspiration pneumonia in the setting of CVA. No focal consolidation on imaging  but we are unable to visualize the LLL due to patients positioning on CXR so this can not be ruled out. Plan -Likely stable for transition to the floor today -Urine culture growing Pseudomonas aeruginosa with resp cul demonstrating E. coli -Continue cefepime with last day as of 10/09/19 -Continue warming per protocol -Discontinue levophed to maintain MAP >65 or Sys BP >110 -Slow taper of stress dose steroids, last dose 0000000  Chronic systolic heart failure with ejection fraction 30-35% and diffuse hypokinesis: Currently appears slightly hypervolemic on exam despite several fluid boluses and  net positive 6.5L since admission. Saturating 99-100% on RA. Plan -Low dose of 20mg  lasix the prior day with improved renal function -Start 50cc/hr D5 -Monitor volume status closely given fluid resuscitation and septic shock  History of pulmonary embolism on Eliquis 5 mg daily at home Currently maintaining respiratory status. No indication of recurrent PE. Plan -Continue home Eliquis  Sacral Ulcer: Small 3x2.4 cm ulceration. Seen by wound care.  Plan -Continue wound care  Nutrition: Will discuss [place] with family. If agreeable, the patient will need NG/OG today given as she is not swallowing per SLP. Plan -Discuss GOC and consider placing an NG/OG  Hx of CVA: Residual deficits are noted with weakness in all four extremties and being bed bound since event.  Plan -Continue statin and Eliquis  -Continue close monitoring as she is at risk for delirium  At risk for renal injury sCr stable despite poor urine output.  Plan -Monitor closely with hydration  At risk for electrolyte imbalance Low K - repleted Na elevated to 147, will avoid NS boluses. Plan -Correct as needed -Repleted Mag and potassium   Anemia: Hgb stable. Plan -Monitor  At risk for hyper and hypoglycemia and illness Hypothermic Plan -SSI -Bair hugger PRN  Best practice:  Diet: NPO Pain/Anxiety/Delirium protocol (if indicated): none VAP protocol (if indicated): Head of bed greater than 30 degree DVT prophylaxis: Home Eliquis GI prophylaxis: Home PPI Glucose control: SSI Mobility: Bedrest Code Status: DNR but full medical care including pressors Family Communication: Resident communicated with family Disposition: Moved to ICU  LABS   PULMONARY No results for input(s): PHART, PCO2ART, PO2ART, HCO3, TCO2, O2SAT in the last 168 hours.  Invalid input(s): PCO2, PO2  CBC Recent Labs  Lab 10/05/19 0100 10/06/19 0245 10/07/19 0413  HGB 10.7* 10.3* 10.6*  HCT 34.6* 32.3* 33.5*  WBC 12.0* 13.2*  10.9*  PLT 144* 141* 125*   COAGULATION No results for input(s): INR in the last 168 hours.  CARDIAC  No results for input(s): TROPONINI in the last 168 hours. No results for input(s): PROBNP in the last 168 hours.  CHEMISTRY Recent Labs  Lab 10/03/19 0338 10/03/19 0338 10/04/19 0250 10/04/19 0250 10/05/19 0100 10/05/19 0100 10/06/19 0245 10/07/19 0413  NA 146*  --  146*  --  147*  --  144 143  K 3.3*   < > 3.8   < > 3.3*   < > 3.7 3.1*  CL 115*  --  116*  --  120*  --  116* 117*  CO2 25  --  21*  --  18*  --  20* 21*  GLUCOSE 78  --  106*  --  87  --  131* 97  BUN 19  --  18  --  18  --  22 26*  CREATININE 0.59  --  0.59  --  0.70  --  0.74 0.81  CALCIUM 8.4*  --  8.3*  --  7.4*  --  8.0* 8.2*  MG  --   --  1.7  --  2.1  --  2.1 2.1  PHOS  --   --  2.9  --  3.4  --  2.6 2.5   < > = values in this interval not displayed.   Estimated Creatinine Clearance: 48.6 mL/min (by C-G formula based on SCr of 0.81 mg/dL).  LIVER Recent Labs  Lab 10/02/19 0824 10/04/19 0250  AST 35 26  ALT 29 22  ALKPHOS 217* 162*  BILITOT 0.7 0.7  PROT 7.8 5.9*  ALBUMIN 2.2* 1.6*   INFECTIOUS Recent Labs  Lab 10/02/19 0835 10/02/19 2023 10/04/19 0250 10/05/19 0100  LATICACIDVEN 2.0* 1.5 0.7  --   PROCALCITON  --   --  0.25 0.16   ENDOCRINE CBG (last 3)  Recent Labs    10/06/19 1216 10/06/19 1626 10/07/19 [place]   Kathi Ludwig, MD [facility], PGY-3  Please see Attending A/P and/or Addendum for final recommendations.

## 2019-10-07 NOTE — Progress Notes (Signed)
Initial Nutrition Assessment  DOCUMENTATION CODES:   Not applicable  INTERVENTION:   Monitor for MBS results tomorrow. If unable to pass and within [place] recommend placement of Cortrak tube.   NUTRITION DIAGNOSIS:   Inadequate oral intake related to inability to eat as evidenced by NPO status.  GOAL:   Patient will meet greater than or equal to 90% of their needs  MONITOR:   PO intake, Supplement acceptance, Weight trends, I & O's, Labs, Diet advancement  REASON FOR ASSESSMENT:   Rounds    ASSESSMENT:   Patient with PMH significant for bedbound status (x3 years), CHF, breast cancer, HTN, GERD, and CVA with resulting dysphagia. Presents this admission with UTI and possible aspiration PNA.   Pt discussed during ICU rounds and with RN.   Mental status improved but pt unable to provide history. Pt had BSE by SLP this am. Had issue with thickened liquids. Plan MBS for tomorrow. If able to pass RD to provide supplements and if not will monitor for GOC.   Of note: pt has multiple wounds  Unsure of pt's UBW. No weight history shown over the last year (last weight documented in 2017 at 101.7 kg). Suspect recent weight loss.   I/O: +6,957 ml since admit  UOP: 1,270 ml x 24 hrs   Drips: D5 @ 50 ml/hr  Labs: K 3.1 (L) CBG 85-120   NUTRITION - FOCUSED PHYSICAL EXAM:    Most Recent Value  Orbital Region  Mild depletion  Upper Arm Region  No depletion  Thoracic and Lumbar Region  Unable to assess  Buccal Region  Mild depletion  Temple Region  Severe depletion  Clavicle Bone Region  Moderate depletion  Clavicle and Acromion Bone Region  Moderate depletion  Scapular Bone Region  Unable to assess  Dorsal Hand  No depletion  Patellar Region  No depletion  Anterior Thigh Region  No depletion  Posterior Calf Region  No depletion  Edema (RD Assessment)  Severe  Hair  Reviewed  Eyes  Unable to assess  Mouth  Unable to assess  Skin  Reviewed  Nails  Reviewed     Diet Order:    Diet Order            Diet NPO time specified  Diet effective now              EDUCATION NEEDS:   Not appropriate for education at this time  Skin:  Skin Assessment: Skin Integrity Issues: Skin Integrity Issues:: Other (Comment), Stage II Stage II: buttocks Other: R ankle/L thigh  Last BM:  1/19  Height:   Ht Readings from Last 1 Encounters:  10/02/19 5\' 3"  (1.6 m)    Weight:   Wt Readings from Last 1 Encounters:  10/07/19 76 kg    Ideal Body Weight:  52.3 kg  BMI:  Body mass index is 29.68 kg/m.  Estimated Nutritional Needs:   Kcal:  1600-1800 kcal  Protein:  80-95 grams  Fluid:  >/= 1.6 L/day  Mariana Single RD, LDN Clinical Nutrition Pager # - [phone]

## 2019-10-07 NOTE — Progress Notes (Signed)
eLink Physician-Brief Progress Note Patient Name: Sharon Velasquez DOB: [birth year] MRN: [id number]   Date of Service  10/07/2019  HPI/Events of Note  K+ 3.1  eICU Interventions  KCL 10 meq iv Q 1 hour x 3 doses        Frederik Pear 10/07/2019, 6:33 AM

## 2019-10-08 ENCOUNTER — Inpatient Hospital Stay (HOSPITAL_COMMUNITY): Payer: Medicare Other

## 2019-10-08 DIAGNOSIS — I5020 Unspecified systolic (congestive) heart failure: Secondary | ICD-10-CM

## 2019-10-08 DIAGNOSIS — B965 Pseudomonas (aeruginosa) (mallei) (pseudomallei) as the cause of diseases classified elsewhere: Secondary | ICD-10-CM

## 2019-10-08 DIAGNOSIS — R6520 Severe sepsis without septic shock: Secondary | ICD-10-CM

## 2019-10-08 LAB — CBC
HCT: 31.4 % — ABNORMAL LOW (ref 36.0–46.0)
Hemoglobin: 10.3 g/dL — ABNORMAL LOW (ref 12.0–15.0)
MCH: 29.4 pg (ref 26.0–34.0)
MCHC: 32.8 g/dL (ref 30.0–36.0)
MCV: 89.7 fL (ref 80.0–100.0)
Platelets: DECREASED 10*3/uL (ref 150–400)
RBC: 3.5 MIL/uL — ABNORMAL LOW (ref 3.87–5.11)
RDW: 18 % — ABNORMAL HIGH (ref 11.5–15.5)
WBC: 9.4 10*3/uL (ref 4.0–10.5)
nRBC: 0.6 % — ABNORMAL HIGH (ref 0.0–0.2)

## 2019-10-08 LAB — BASIC METABOLIC PANEL
Anion gap: 6 (ref 5–15)
BUN: 26 mg/dL — ABNORMAL HIGH (ref 8–23)
CO2: 20 mmol/L — ABNORMAL LOW (ref 22–32)
Calcium: 8 mg/dL — ABNORMAL LOW (ref 8.9–10.3)
Chloride: 111 mmol/L (ref 98–111)
Creatinine, Ser: 0.82 mg/dL (ref 0.44–1.00)
GFR calc Af Amer: >60 mL/min (ref >60)
GFR calc non Af Amer: >60 mL/min (ref >60)
Glucose, Bld: 305 mg/dL — ABNORMAL HIGH (ref 70–99)
Potassium: 3.1 mmol/L — ABNORMAL LOW (ref 3.5–5.1)
Sodium: 137 mmol/L (ref 135–145)

## 2019-10-08 LAB — GLUCOSE, CAPILLARY
Glucose-Capillary: 77 mg/dL (ref 70–99)
Glucose-Capillary: 70 mg/dL (ref 70–99)
Glucose-Capillary: 93 mg/dL (ref 70–99)
Glucose-Capillary: 77 mg/dL (ref 70–99)
Glucose-Capillary: 85 mg/dL (ref 70–99)

## 2019-10-08 LAB — APTT
aPTT: >200 seconds (ref 24–36)
aPTT: >200 seconds (ref 24–36)

## 2019-10-08 LAB — HEPARIN LEVEL (UNFRACTIONATED)
Heparin Unfractionated: 2.08 IU/mL — ABNORMAL HIGH (ref 0.30–0.70)
Heparin Unfractionated: 2 IU/mL — ABNORMAL HIGH (ref 0.30–0.70)

## 2019-10-08 MED ORDER — HEPARIN (PORCINE) 25000 UT/250ML-% IV SOLN
1000.0000 [IU]/h | INTRAVENOUS | Status: DC
  Administered 2019-10-08: 1000 [IU]/h via INTRAVENOUS
  Filled 2019-10-08: qty 250

## 2019-10-08 MED ORDER — GADOBUTROL 1 MMOL/ML IV SOLN
10.0000 mL | Freq: Once | INTRAVENOUS | Status: AC | PRN
  Administered 2019-10-08: 10 mL via INTRAVENOUS

## 2019-10-08 MED ORDER — FUROSEMIDE 10 MG/ML IJ SOLN
20.0000 mg | Freq: Once | INTRAMUSCULAR | Status: AC
  Administered 2019-10-08: 20 mg via INTRAVENOUS
  Filled 2019-10-08: qty 2

## 2019-10-08 NOTE — Progress Notes (Addendum)
Paged by nursing staff and messaged by pharmacy regarding aPTT >200 and supra-therapeutic heparin level. Currently on heparin gtt. Examined and evaluated patient at bedside. No obvious evidence of bleed. Alert but not oriented consistent with earlier exam by day time providers. Per chart review, received last dose of Eliquis on 10/06/19. Started on heparin gtt today due to inability to tolerate oral intake. No indication for urgent anticoagulation at this time. She has been on Eliquis for anticoagulation after diagnosis of subacute pulmonary embolism in 2017. Will d/c heparin for now and monitor labs.

## 2019-10-08 NOTE — Progress Notes (Signed)
RN applied bear hugger for an axillary  Temp of 93.4. will reassess  In a hour

## 2019-10-08 NOTE — Progress Notes (Signed)
SLP Cancellation Note  Patient Details Name: Sharon Velasquez MRN: [id number] DOB: [birth year]   Cancelled treatment:       Reason Eval/Treat Not Completed: Other (comment). Pt could not tolerate positioning for MBS. Efforts discontinued    Elizibeth Breau, Katherene Ponto 10/08/2019, 9:50 AM

## 2019-10-08 NOTE — Progress Notes (Signed)
Transfer Summary: Sharon Velasquez is 87 year-old female with a medical history of CVA 2017 with residual L sided deficits, recurrent UTIs, breast CA, SJS, HFrEF EF 30%, PE on Eliquis, and MSSA bacteremia who presented to the ED on 1/15 with encephalopathy and was found to be in septic shock from a Pseudomonas UTI. She was transferred to the ICU for pressor support. Her ICU course was complicated by persistent encephalopathy despite treatment of infection and improvement in hemodynamic status. She is now having trouble swallowing and has failed 2 swallow studies. PCCM consulted palliative care for [place] discussion.   Subjective: Hypothermic overnight requiring bair hugger. This AM patient is resting in bed and appears comfortable. Does not answer any questions or follows commands.   Objective:  Vital signs in last 24 hours: Vitals:   10/08/19 0108 10/08/19 0231 10/08/19 0310 10/08/19 0429  BP: 128/66   (!) 112/55  Pulse: (!) 45   71  Resp: 14   11  Temp: (!) 93.4 F (34.1 C) (!) 93.4 F (34.1 C) (!) 93.7 F (34.3 C) (!) 97.5 F (36.4 C)  TempSrc: Axillary Axillary Axillary Oral  SpO2: 99%   100%  Weight:      Height:       General: chronically ill appearing female, in bed, nonverbal, in NAD  CV: RRR, no mrg  Pulm: coarse breath sounds anteriorly, unable to auscultate posteriorly  ABd: soft, no distention, no grimace to palpation in all quadrants Ext: warm on bair hugger, 1+ pittiing edema bilaterally, PICC line on RUE  Neuro: opens eyes to verbal stimulus, does not follow commands   Assessment/Plan:  Principal Problem:   Septic shock (HCC) Active Problems:   Lower urinary tract infectious disease   Hypernatremia   Chronic systolic CHF (congestive heart failure) (HCC)   Stage 2 skin ulcer of sacral region [facility])   # Septic shock 2/2 Pseudomonas UTI: Resolved. Hypothermic overnight, but remains hemodynamically stable. She is on day 7 of cefepime.  - Bair hugger PRN - Last dose  of cefepime today   # Acute metabolic encephalopathy: Patient is alert and talkative at baseline. She is now somnolent and only oriented to self. Infection seems to be resolving and no electrolyte abnormalities to explain AMS. Not on any centrally acting medications. She does have a history of CVA and we are not able to perform a complete neuro exam as she is not following commands. MRI brain to r/o acute CVA. Can consider EEG to r/o NCSE. Delirium and cephalosporin neurotoxicity is also in the differential. PCCM consulted palliative care 1/20 for [place] discussion given persistent AMS and poor PO intake. We will work up as below.  - Follow up MRI brain w/o contrast  - Delirium precautions  - Follow up palliative care note   # Aspiration: Patient has failed 2 swallow evaluations and remains NPO at this time. MBS planned for today.  - Aspiration precautions  - Follow up MBS   # HFrEF: Not on EBD at home. She is net positive 7L and is 15 lbs up since admission. Appears hypervolemic on exam. UOP minimal overnight. Renal function normal, but Cr has been trending up. - IV Lasix 20 mg x1 - BMP in AM   - Strict I/Os + daily weights   # Hx of PE: Unable to swallow Eliquis yesterday, will switch to heparin gtt.    Prior to Admission Living Arrangement: Patient lives with daughter Sharon Velasquez who is her primary care taker.  Anticipated Discharge  Location: Same as prior  Barriers to Discharge: PO intake and GOC discussion  Dispo: Pending clinical improvement and GOC conversion to determine next steps in care.   Welford Roche, MD 10/08/2019, 6:06 AM Pager: [phone]

## 2019-10-08 NOTE — Progress Notes (Addendum)
ANTICOAGULATION CONSULT NOTE - Initial Consult  Pharmacy Consult for Heparin Indication: hx DVT   Patient Measurements: Height: 5\' 3"  (160 cm) Weight: 176 lb 12.9 oz (80.2 kg) IBW/kg (Calculated) : 52.4   Vital Signs: Temp: 97.8 F (36.6 C) (01/21 1942) Temp Source: Oral (01/21 1942) BP: 112/57 (01/21 1942) Pulse Rate: 70 (01/21 1942)  Labs: Recent Labs    10/06/19 0245 10/06/19 0245 10/07/19 0413 10/08/19 0630 10/08/19 1849 10/08/19 2024  HGB 10.3*   < > 10.6* 10.3*  --   --   HCT 32.3*  --  33.5* 31.4*  --   --   PLT 141*  --  125* PLATELET CLUMPS NOTED ON SMEAR, COUNT APPEARS DECREASED  --   --   APTT  --   --   --   --  >200* >200*  HEPARINUNFRC  --   --   --   --  2.08* 2.00*  CREATININE 0.74  --  0.81 0.82  --   --    < > = values in this interval not displayed.    Estimated Creatinine Clearance: 49.4 mL/min (by C-G formula based on SCr of 0.82 mg/dL).   Assessment: 86 yo W on apixaban PTA for hx DVT/PE and transitioned to heparin due to inability to swallow. Last dose of apixaban 1/19 but HL still at 2 today. APTT > 200, even after holding heparin for 30 min. Unclear reason for high aPTT. No bleeding.  Discussed with IM Resident, was unable to find date of DVT/PE on chart review and appears that patient has been on apixaban since at least July 2017 (per [facility] Chart). Possible reasons for continuing apixaban are hx cancer, CVA in 2017 leaving pt bedbound and obesity. Dr. Truman Hayward agreed to hold heparin for now given unclear indication and high aPTT levels. IM team to discuss in AM.   Plan:  Stop heparin, consult discontinued    Benetta Spar, PharmD, BCPS, [facility] Clinical Pharmacist  Please check AMION for all [place] phone numbers After 10:00 PM, call [place] [phone]

## 2019-10-08 NOTE — Progress Notes (Signed)
CRITICAL VALUE ALERT  Critical Value:  PTT >200  Date & Time Notied:  10/08/19 2012  Provider Notified: Truman Hayward  Orders Received/Actions taken: Heparin stopped

## 2019-10-08 NOTE — Progress Notes (Signed)
Internal Medicine Attending:   I saw and examined the patient. I reviewed the resident's note and I agree with the resident's findings and plan as documented in the resident's note.  As noted patient seems to be recovering from severe sepsis due to urinary tract infection/pneumonia.  However she remains encephalopathic and lethargic.  Her mental status has changed from admission I would pursue neuroimaging with an MRI.  Also on the differential is cephalosporin neurotoxicity and it appears she has received her last dose of cefepime today we will have to monitor her mental status going forward.   For her HFrEF, she is volume up, agree with continued intermittent lasix dosing, blood pressure is soft and not requiring supplemental oxygen so would not agressively diurese at this time.

## 2019-10-08 NOTE — Progress Notes (Signed)
ANTICOAGULATION CONSULT NOTE - Initial Consult  Pharmacy Consult for heparin Indication: pulmonary embolus  Allergies  Allergen Reactions  . Latex   . 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 [facility]    Patient Measurements: Height: 5\' 3"  (160 cm) Weight: 176 lb 12.9 oz (80.2 kg) IBW/kg (Calculated) : 52.4  Vital Signs: Temp: 97.5 F (36.4 C) (01/21 0734) Temp Source: Oral (01/21 0734) BP: 99/61 (01/21 0734) Pulse Rate: 50 (01/21 0734)  Labs: Recent Labs    10/06/19 0245 10/07/19 0413 10/08/19 0630  HGB 10.3* 10.6*  --   HCT 32.3* 33.5*  --   PLT 141* 125*  --   CREATININE 0.74 0.81 0.82    Estimated Creatinine Clearance: 49.4 mL/min (by C-G formula based on SCr of 0.82 mg/dL).  Assessment: 86 yo f with a hx of PE on apixaban PTA  Now unable to take PO and converting to heparin IV  Last apixaban 1/19  Goal of Therapy:  Heparin level 0.3-0.7 units/ml Monitor platelets by anticoagulation protocol: Yes   Plan:  Change to heparin gtt 1000 units/hr Initial HL aPTT 1800 Daily hl aptt cbc (may not need aptt)  Barth Kirks, PharmD, BCPS, BCCCP Clinical Pharmacist [phone]  Please check AMION for all [place] numbers  10/08/2019 10:07 AM

## 2019-10-08 NOTE — Progress Notes (Signed)
  Speech Language Pathology Treatment: Dysphagia  Patient Details Name: Sharon Velasquez MRN: [id number] DOB: 1931/04/11 Today's Date: 10/08/2019 Time: 1300-1310 SLP Time Calculation (min) (ACUTE ONLY): 10 min  Assessment / Plan / Recommendation Clinical Impression  Attempted MBS this am, pt could not be positioned correctly for imaging. Revisited pt this pm after her MRI to offer PO and see if there were any promising signs for PO intake. Pt lethargic, but awakened to stimuli. She continues to have pooled secretions in left cheek and is persistently throat clearing. When offered sips of nectar, pt turned her head away and said "no." when juice touched to her mouth, she tasted it, but did not elicit a swallow and would not participate in further acceptance. Prognosis for adequate oral intake remains poor. Would recommend allowing purees and sips if/when pt is willing to accept with known risk of aspiration if daughter is in agreement.    HPI HPI: 86 year-old female with a medical history of CVA in 2017, recurrent UTIs, breast cancer, Steven-Johnson syndrome, CHF, PE on Eliquis, and MSSA bacteremia who presents to the ED from home for evaluation of AMS. Dx sepsis secondary to UTI, metabolic encephalopathy. MBS in 2017 after acute stroke revealed dysphagia with recs for nectar liquids (due to aspiration of thins) and purees. Pt has been bedbound for years; alert/conversational at baseline per notes.      SLP Plan  Continue with current plan of care       Recommendations  Diet recommendations: NPO                Oral Care Recommendations: Oral care BID Follow up Recommendations: Skilled Nursing facility Plan: Continue with current plan of care       GO                Emaad Nanna, Katherene Ponto 10/08/2019, 1:36 PM

## 2019-10-09 ENCOUNTER — Other Ambulatory Visit (HOSPITAL_COMMUNITY): Payer: TRICARE For Life (TFL)

## 2019-10-09 DIAGNOSIS — Z515 Encounter for palliative care: Secondary | ICD-10-CM

## 2019-10-09 DIAGNOSIS — Z91048 Other nonmedicinal substance allergy status: Secondary | ICD-10-CM

## 2019-10-09 DIAGNOSIS — Z9189 Other specified personal risk factors, not elsewhere classified: Secondary | ICD-10-CM

## 2019-10-09 DIAGNOSIS — Z7189 Other specified counseling: Secondary | ICD-10-CM

## 2019-10-09 DIAGNOSIS — R52 Pain, unspecified: Secondary | ICD-10-CM

## 2019-10-09 DIAGNOSIS — G9340 Encephalopathy, unspecified: Secondary | ICD-10-CM

## 2019-10-09 DIAGNOSIS — R4589 Other symptoms and signs involving emotional state: Secondary | ICD-10-CM

## 2019-10-09 LAB — CBC
HCT: 33.6 % — ABNORMAL LOW (ref 36.0–46.0)
Hemoglobin: 10.8 g/dL — ABNORMAL LOW (ref 12.0–15.0)
MCH: 28.7 pg (ref 26.0–34.0)
MCHC: 32.1 g/dL (ref 30.0–36.0)
MCV: 89.4 fL (ref 80.0–100.0)
Platelets: DECREASED 10*3/uL (ref 150–400)
RBC: 3.76 MIL/uL — ABNORMAL LOW (ref 3.87–5.11)
RDW: 17.9 % — ABNORMAL HIGH (ref 11.5–15.5)
WBC: 7.8 10*3/uL (ref 4.0–10.5)
nRBC: 0.9 % — ABNORMAL HIGH (ref 0.0–0.2)

## 2019-10-09 LAB — COMPREHENSIVE METABOLIC PANEL
ALT: 27 U/L (ref 0–44)
AST: 37 U/L (ref 15–41)
Albumin: 1.5 g/dL — ABNORMAL LOW (ref 3.5–5.0)
Alkaline Phosphatase: 99 U/L (ref 38–126)
Anion gap: 5 (ref 5–15)
BUN: 29 mg/dL — ABNORMAL HIGH (ref 8–23)
CO2: 21 mmol/L — ABNORMAL LOW (ref 22–32)
Calcium: 8.4 mg/dL — ABNORMAL LOW (ref 8.9–10.3)
Chloride: 116 mmol/L — ABNORMAL HIGH (ref 98–111)
Creatinine, Ser: 0.96 mg/dL (ref 0.44–1.00)
GFR calc Af Amer: >60 mL/min (ref >60)
GFR calc non Af Amer: 54 mL/min — ABNORMAL LOW (ref >60)
Glucose, Bld: 89 mg/dL (ref 70–99)
Potassium: 3.2 mmol/L — ABNORMAL LOW (ref 3.5–5.1)
Sodium: 142 mmol/L (ref 135–145)
Total Bilirubin: 0.8 mg/dL (ref 0.3–1.2)
Total Protein: 5.1 g/dL — ABNORMAL LOW (ref 6.5–8.1)

## 2019-10-09 LAB — GLUCOSE, CAPILLARY
Glucose-Capillary: 77 mg/dL (ref 70–99)
Glucose-Capillary: 78 mg/dL (ref 70–99)

## 2019-10-09 LAB — APTT: aPTT: 31 seconds (ref 24–36)

## 2019-10-09 MED ORDER — FUROSEMIDE 10 MG/ML IJ SOLN
20.0000 mg | Freq: Every day | INTRAMUSCULAR | Status: DC
  Administered 2019-10-09: 20 mg via INTRAVENOUS
  Filled 2019-10-09: qty 2

## 2019-10-09 MED ORDER — CHLORHEXIDINE GLUCONATE CLOTH 2 % EX PADS
6.0000 | MEDICATED_PAD | Freq: Every day | CUTANEOUS | Status: DC
  Administered 2019-10-09: 6 via TOPICAL

## 2019-10-09 MED ORDER — LORAZEPAM 2 MG/ML IJ SOLN: 0.5000 mg | INTRAMUSCULAR | Status: DC | PRN

## 2019-10-09 MED ORDER — MORPHINE SULFATE (PF) 2 MG/ML IV SOLN
1.0000 mg | INTRAVENOUS | Status: DC | PRN
  Administered 2019-10-09: 2 mg via INTRAVENOUS
  Filled 2019-10-09: qty 1

## 2019-10-09 MED ORDER — GLYCOPYRROLATE 0.2 MG/ML IJ SOLN
0.4000 mg | INTRAMUSCULAR | Status: DC | PRN
  Administered 2019-10-09: 0.4 mg via INTRAVENOUS
  Filled 2019-10-09: qty 2

## 2019-10-09 MED ORDER — BISACODYL 10 MG RE SUPP: 10.0000 mg | Freq: Every day | RECTAL | Status: DC | PRN

## 2019-10-09 MED ORDER — MORPHINE SULFATE (PF) 2 MG/ML IV SOLN
1.0000 mg | INTRAVENOUS | Status: DC | PRN
  Administered 2019-10-09: 1 mg via INTRAVENOUS
  Filled 2019-10-09: qty 1

## 2019-10-09 MED ORDER — GLYCOPYRROLATE 0.2 MG/ML IJ SOLN
0.4000 mg | INTRAMUSCULAR | Status: DC
  Administered 2019-10-09 (×2): 0.4 mg via INTRAVENOUS
  Filled 2019-10-09 (×3): qty 2

## 2019-10-09 MED ORDER — POTASSIUM CHLORIDE 10 MEQ/100ML IV SOLN
10.0000 meq | INTRAVENOUS | Status: DC
  Administered 2019-10-09 (×3): 10 meq via INTRAVENOUS
  Filled 2019-10-09 (×3): qty 100

## 2019-10-09 MED ORDER — ONDANSETRON HCL 4 MG/2ML IJ SOLN: 4.0000 mg | Freq: Four times a day (QID) | INTRAMUSCULAR | Status: DC | PRN

## 2019-10-09 MED ORDER — HYPROMELLOSE (GONIOSCOPIC) 2.5 % OP SOLN
1.0000 [drp] | Freq: Three times a day (TID) | OPHTHALMIC | Status: DC | PRN
  Filled 2019-10-09: qty 15

## 2019-10-09 NOTE — Progress Notes (Signed)
Responded to spiritual care consult for family support. I called Daughter (Faith), home and cell. There was no answer. I will attempt at a later time.   Palliative care  Chaplain Resident  Fidel Levy [phone]

## 2019-10-09 NOTE — Consult Note (Addendum)
Consultation Note Date: 10/09/2019   Patient Name: Sharon Velasquez  DOB: May 13, 1931  MRN: [id number]  Age / Sex: 86 y.o., female  PCP: Patient, No Pcp Per Referring Physician: Lucious Groves, DO  Reason for Consultation: Establishing goals of care  HPI/Patient Profile:  Kelsey Soldan is 86 year-old female with a medical history of CVA 2017 with residual L sided deficits, recurrent UTIs, breast CA, SJS, HFrEF EF 30%, PE on Eliquis, and MSSA bacteremia who presented to the ED on 1/15 with encephalopathy and was found to be in septic shock from a Pseudomonas UTI. She was transferred to the ICU for pressor support. Her ICU course was complicated by persistent encephalopathy despite treatment of infection and improvement in hemodynamic status. She is now having trouble swallowing and has failed 2 swallow studies. PCCM consulted palliative care for [place] discussion.   Clinical Assessment and Goals of Care: I have reviewed medical records including EPIC notes, labs and imaging. Touched base with patients bedside RN, Hoyle Sauer. Patient is somnolent upon exam. Unable to answer questions. Opens eyes, able to track for short periods. Appears greatly anasarcic. Has a bear hugger on, has been unable to maintain regular body temperatures.   Called patients daughter, Kyra Searles. I introduced Palliative Medicine as specialized medical care for people living with serious illness. It focuses on providing relief from the symptoms and stress of a serious illness. The goal is to improve quality of life for both the patient and the family.   Faith states that she has been worried that her mother is non-conversant. She feels that this is the end for her mother. She shared that she has been her caregiver since 2015. She has seen her mother go through multiple episodes over the years. More recently she noticed that her mother was having more  difficulty with swallowing, though Faith was quite keen to this and modified how she was feeding her.   We talked about her mothers UTI and possible pneumonia upon admission. Discussed how sepctic shock can affect our organs and how much more difficult it can be for geriatric patients to recover from that. She asked me "give it to her straight". I shared that if mom is unable to safely eat or drink that she likely has limited time left. We discussed her wishes. Faith shared that her mother had been explicit about not wanting CPR, Intubation, or feeding tubes. She said that even the thought of that would be inconsistent with her mothers wishes for herself. Rather, she would have wanted to be comfortable and allow the lord to take her when it it her time. Faith was overwhelmed with emotion, provided therapeutic listening as emotional support.   We discussed what keeping miss Kincheloe comfortable in the hospital would look like. She agreed to stopping all unnecessary treatments such as blood draws, antibiotics, and frequent vitals. Spoke to bedside RN about these changes. Updated primary medical team who were in agreement of the order modifications. Entered TOC order for Hospice Home placement.   Decision Maker:  Faith Malachi-Moskal [phone]  SUMMARY OF RECOMMENDATIONS   Transition to comfort measures Chaplain Consult TOC Consult  Code Status/Advance Care Planning:  DNR, no feeding tubes  Symptom Management: Dyspnea:  - Morphine 1-2mg  IV Q1H PRN Generalized Pain:  - Tylenol 650mg  PO/PR Q6H PRN  - Morphine as above  - Heat Pack PRN Fever:  - Tylenol as above Anxiety:  - Lorazepam 1mg  PO/SL/IV  Q1H PRN Nausea:  - Zofran 4mg  PO/IV Q6H PRN  Secretions:  - Glycopyrrolate 0.4mg  IV Q4H ATC Dry Eyes:  - Artificial Tears PRN Xerostomia:  - BID oral care Urinary Retention:  -Bladder scan if > 380ml, place foley  Constipation:  - Bisacodyl 10mg  PR PRN QDay Wound Care:  - As per  nursing  - Turn Q2H Need for Emotional Support Spiritual:  - Chaplain consult  - Therapeutic Listening  Palliative Prophylaxis:   Aspiration, Bowel Regimen, Delirium Protocol, Eye Care, Frequent Pain Assessment, Oral Care and Turn Reposition  Additional Recommendations (Limitations, Scope, Preferences):  Full Comfort Care  Psycho-social/Spiritual:   Desire for further Chaplaincy support:yes  Additional Recommendations: Caregiving  Support/Resources, Education on Hospice and Grief/Bereavement Support  Prognosis:   < 2 weeks given lack of PO intake  Discharge Planning: Hospice facility     Primary Diagnoses: Present on Admission: . Chronic systolic CHF (congestive heart failure) ([place]) . Lower urinary tract infectious disease . Stage 2 skin ulcer of sacral region ([place]) . Hypernatremia . Acute encephalopathy  I have reviewed the medical record, interviewed the patient and family, and examined the patient. The following aspects are pertinent.  Past Medical History:  Diagnosis Date  . Acute renal insufficiency   . Adenocarcinoma of breast ([place])   . Aromatase inhibitor use   . Atopic dermatitis   . Cancer (HCC)    breast  . Cellulitis   . Debilitated patient   . Dysphagia   . Elevated blood pressure reading without diagnosis of hypertension   . Encounter for preventive health examination   . Esophageal reflux   . GERD (gastroesophageal reflux disease)   . Glossitis   . Hypertension   . Increased urinary frequency   . Intertrigo   . Joint pain   . Localized osteoarthritis of shoulder   . Lump or mass in breast   . Lymphedema   . Malignant neoplasm without specification of site ([place])   . Mammogram abnormal   . Morbid obesity ([place])   . Neuroendocrine tumor   . Neuroendocrine tumor   . Non-compliance   . Osteoarthritis   . Osteoarthritis   . Primary localized osteoarthritis of right hip   . Rheumatoid arthritis ([place])   . Seasonal allergies   . Subacromial  bursitis   . Truncal muscle weakness    Social History   Socioeconomic History  . Marital status: Widowed    Spouse name: Not on file  . Number of children: Not on file  . Years of education: Not on file  . Highest education level: Not on file  Occupational History  . Not on file  Tobacco Use  . Smoking status: Never Smoker  Substance and Sexual Activity  . Alcohol use: No  . Drug use: Not on file  . Sexual activity: Not on file  Other Topics Concern  . Not on file  Social History Narrative  . Not on file   Social Determinants of Health   Financial Resource Strain:   . Difficulty of Paying Living Expenses: Not on file  Food  Insecurity:   . Worried About Charity fundraiser in the Last Year: Not on file  . Ran Out of Food in the Last Year: Not on file  Transportation Needs:   . Lack of Transportation (Medical): Not on file  . Lack of Transportation (Non-Medical): Not on file  Physical Activity:   . Days of Exercise per Week: Not on file  . Minutes of Exercise per Session: Not on file  Stress:   . Feeling of Stress : Not on file  Social Connections:   . Frequency of Communication with Friends and Family: Not on file  . Frequency of Social Gatherings with Friends and Family: Not on file  . Attends Religious Services: Not on file  . Active Member of Clubs or Organizations: Not on file  . Attends Archivist Meetings: Not on file  . Marital Status: Not on file   Family History  Problem Relation Age of Onset  . Cancer Other    Scheduled Meds: . chlorhexidine  15 mL Mouth Rinse BID  . Chlorhexidine Gluconate Cloth  6 each Topical Daily  . furosemide  20 mg Intravenous Daily  . mouth rinse  15 mL Mouth Rinse q12n4p  . pantoprazole  40 mg Oral Daily  . pravastatin  20 mg Oral q1800  . sodium chloride flush  10-40 mL Intracatheter Q12H  . sodium chloride flush  10-40 mL Intracatheter Q12H   Continuous Infusions: . dextrose 50 mL/hr at 10/08/19 2344  .  potassium chloride 10 mEq (10/09/19 0914)   PRN Meds:.acetaminophen **OR** acetaminophen, albuterol, lip balm, morphine injection, sodium chloride flush, sodium chloride flush, zinc oxide Medications Prior to Admission:  Prior to Admission medications   Medication Sig Start Date End Date Taking? Authorizing Provider  acetaminophen (TYLENOL) 500 MG tablet Take 1,000 mg by mouth at bedtime.    Yes [provider]  albuterol (PROVENTIL) (2.5 MG/3ML) 0.083% nebulizer solution Inhale 3 mLs into the lungs 3 (three) times daily as needed for wheezing or shortness of breath.   Yes [provider]  apixaban (ELIQUIS) 5 MG TABS tablet Take 1 tablet (5 mg total) by mouth 2 (two) times daily. 03/19/16  Yes Donzetta Starch, NP  Calcium Carbonate-Vitamin D 600-200 MG-UNIT CAPS Take 1 tablet by mouth once daily   Yes [provider]  chlorhexidine (HIBICLENS) 4 % external liquid Apply 1 application topically daily as needed.   Yes [provider]  Emollient (AQUAPHOR ADVANCED THERAPY EX) Apply 1 application topically as needed (red areas of skin).   Yes [provider]  ferrous sulfate 325 (65 FE) MG tablet Take 325 mg by mouth daily with breakfast.    Yes [provider]  folic acid (FOLVITE) A999333 MCG tablet Take 400 mcg by mouth daily.   Yes [provider]  glycopyrrolate (ROBINUL) 1 MG tablet Take 1 mg by mouth 2 (two) times daily.   Yes [provider]  loratadine (CLARITIN) 10 MG tablet Take 10 mg by mouth daily.    Yes [provider]  omeprazole (PRILOSEC) 20 MG capsule Take 20 mg by mouth daily.   Yes [provider]  pravastatin (PRAVACHOL) 20 MG tablet Take 1 tablet (20 mg total) by mouth daily at 6 PM. 03/13/16  Yes Biby, Massie Kluver, NP  Vitamin D, Cholecalciferol, 400 units CAPS Give 1 capsule by mouth daily   Yes [provider]  zinc oxide (BALMEX) 11.3 % CREA cream Apply 1 application topically daily  as  needed (wound care).   Yes [provider]  Maltodextrin-Xanthan Gum (RESOURCE THICKENUP CLEAR) POWD Take 120 g by mouth as needed (nectar thick liquid consistnecy). Patient not taking: Reported on 10/02/2019 03/13/16   Donzetta Starch, NP   Allergies  Allergen Reactions  . Latex   . 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 MAR   Review of Systems  Unable to perform ROS   Physical Exam Vitals and nursing note reviewed.  Constitutional:      General: She is in acute distress.     Appearance: She is obese. She is ill-appearing.  HENT:     Head:     Comments: Head is favoring Left side    Nose: Nose normal.     Mouth/Throat:     Mouth: Mucous membranes are dry.  Eyes:     Comments: Tracking  Cardiovascular:     Rate and Rhythm: Regular rhythm.     Pulses: Normal pulses.  Pulmonary:     Effort: Respiratory distress present.  Abdominal:     General: Bowel sounds are normal.  Musculoskeletal:        General: Normal range of motion.     Cervical back: Normal range of motion.  Skin:    General: Skin is warm.     Capillary Refill: Capillary refill takes less than 2 seconds.  Neurological:     Comments: somnolent    Vital Signs: BP 101/68 (BP Location: Left Wrist)   Pulse 80   Temp 98.8 F (37.1 C) (Oral)   Resp 16   Ht 5\' 3"  (1.6 m)   Wt 80.2 kg   SpO2 100%   BMI 31.32 kg/m  Pain Scale: PAINAD   Pain Score: 0-No pain  SpO2: SpO2: 100 % O2 Device:SpO2: 100 % O2 Flow Rate: .O2 Flow Rate (L/min): 2 L/min  IO: Intake/output summary:   Intake/Output Summary (Last 24 hours) at 10/09/2019 0956 Last data filed at 10/08/2019 1500 Gross per 24 hour  Intake 1292.18 ml  Output --  Net 1292.18 ml   LBM: Last BM Date: 10/06/17 Baseline Weight: Weight: 95.3 kg Most recent weight: Weight: 80.2 kg     Palliative Assessment/Data: 10%  Time In: 0900 Time Out: 1015 Time Total: 75 Greater than 50%  of this time was spent counseling and coordinating care  related to the above assessment and plan.  Signed by: Rosezella Rumpf, NP   Please contact Palliative Medicine Team phone at [phone] for questions and concerns.  For individual provider: See Shea Evans

## 2019-10-09 NOTE — Progress Notes (Signed)
RN gave report to Cabin crew at [place] about pt.

## 2019-10-09 NOTE — TOC Initial Note (Addendum)
Transition of Care [facility]) - Initial/Assessment Note    Patient Details  Name: Sharon Velasquez MRN: [id number] Date of Birth: 03-04-1931  [facility] of [facility]) CM/SW Contact:    Marilu Favre, RN Phone Number: 10/09/2019, 10:16 AM  Clinical Narrative:                 Received referral for home with hospice.  Called and spoke to daughter Kyra Searles, who has changed her mind and now wants residential hospice. Discussed offered choice. Faith chose [facility]. Called referral line and left message awaiting call back.  Spoke to Baker with [facility] . Referral given . [facility] will review clinicals.   Expected Discharge Plan: [place]     Patient Goals and CMS Choice   CMS Medicare.gov Compare Post Acute Care list provided to:: Other (Comment Required)(daughter Faith) Choice offered to / list presented to : Adult Children  Expected Discharge Plan and Services Expected Discharge Plan: [place] In-house Referral: Hospice / Palliative Care Discharge Planning Services: NA Post Acute Care Choice: Hospice                             HH Arranged: NA          Prior Living Arrangements/Services     Patient language and need for interpreter reviewed:: Yes        Need for Family Participation in Patient Care: Yes (Comment)     Criminal Activity/Legal Involvement Pertinent to Current Situation/Hospitalization: No - Comment as needed  Activities of Daily Living   ADL Screening (condition at time of admission) Patient's cognitive ability adequate to safely complete daily activities?: No Is the patient deaf or have difficulty hearing?: No Does the patient have difficulty seeing, even when wearing glasses/contacts?: No Does the patient have difficulty concentrating, remembering, or making decisions?: Yes Patient able to express need for assistance with ADLs?: Yes Does the patient have difficulty dressing or bathing?: Yes Independently  performs ADLs?: No Communication: Independent Dressing (OT): Dependent Is this a change from baseline?: Pre-admission baseline Grooming: Dependent Is this a change from baseline?: Pre-admission baseline Feeding: Dependent Is this a change from baseline?: Pre-admission baseline Bathing: Dependent Is this a change from baseline?: Pre-admission baseline Toileting: Dependent Is this a change from baseline?: Pre-admission baseline In/Out Bed: Dependent Is this a change from baseline?: Pre-admission baseline Walks in Home: Dependent Is this a change from baseline?: Pre-admission baseline Does the patient have difficulty walking or climbing stairs?: Yes Weakness of Legs: Both Weakness of Arms/Hands: Both  Permission Sought/Granted                  Emotional Assessment         Alcohol / Substance Use: Not Applicable    Admission diagnosis:  Dehydration [E86.0] Confusion [R41.0] Sepsis ([place]) [A41.9] Urinary tract infection with hematuria, site unspecified [N39.0, R31.9] Patient Active Problem List   Diagnosis Date Noted  . Septic shock ([place]) 10/02/2019  . Stage 2 skin ulcer of sacral region ([place]) 10/02/2019  . 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 Chronic systolic CHF (congestive heart failure) ([place]) 03/25/2016  . Hyperlipidemia 03/25/2016  . Acute right MCA stroke ([place]) 03/07/2016  . 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 Hypotension 02/13/2016  . Iron deficiency anemia 02/13/2016  . Thrombocytopenia ([place]) 02/13/2016  . Hypothermia   . Malnutrition of moderate degree 02/02/2016  . MSSA (methicillin susceptible Staphylococcus aureus) infection 01/31/2016  .  Acute encephalopathy 01/31/2016  . Lower urinary tract infectious disease 01/30/2016  . Hypernatremia 01/30/2016  . Vitamin D deficiency 01/07/2016  . Hypertensive heart disease with CHF (congestive heart failure) ([place])   . GERD (gastroesophageal reflux disease)   . Cancer ([place])   . Seasonal  allergies   . Stevens-Johnson syndrome ([place]) 12/29/2015  . Candida infection of mouth 12/28/2015  . Colonic constipation 12/28/2015  . Malignant neoplasm of female breast ([place]) 12/25/2015  . Erythematous condition 12/25/2015  . Morbid (severe) obesity due to excess calories ([place]) 12/25/2015  . Malignant neoplasm of kidney ([place]) 05/05/2014   PCP:  Patient, No Pcp Per Pharmacy:   [facility] [id number] - [place], [place] - [address] AT [place] [address] [place] [address] Phone: [phone] Fax: [phone]     Social Determinants of Health (SDOH) Interventions    Readmission Risk Interventions No flowsheet data found.

## 2019-10-09 NOTE — Discharge Summary (Signed)
Name: Sharon Velasquez MRN: [id number] DOB: 10/19/1930 86 y.o. PCP: Patient, No Pcp Per  Date of Admission: 10/02/2019  8:16 AM Date of Discharge:  10/09/19 Attending Physician: Lucious Groves, DO  Discharge Diagnosis:  1. Septic Shock due to UTI  Discharge Medications: Allergies as of 10/09/2019      Reactions   Latex    Other Other (See Comments)   Natural Rubber - Per MAR      Medication List    STOP taking these medications   albuterol (2.5 MG/3ML) 0.083% nebulizer solution Commonly known as: PROVENTIL   apixaban 5 MG Tabs tablet Commonly known as: ELIQUIS   Calcium Carbonate-Vitamin D 600-200 MG-UNIT Caps   chlorhexidine 4 % external liquid Commonly known as: HIBICLENS   ferrous sulfate 325 (65 FE) MG tablet   folic acid A999333 MCG tablet Commonly known as: FOLVITE   loratadine 10 MG tablet Commonly known as: CLARITIN   omeprazole 20 MG capsule Commonly known as: PRILOSEC   pravastatin 20 MG tablet Commonly known as: PRAVACHOL   Resource ThickenUp Clear Powd   Vitamin D (Cholecalciferol) 10 MCG (400 UNIT) Caps   zinc oxide 11.3 % Crea cream Commonly known as: BALMEX     TAKE these medications   acetaminophen 500 MG tablet Commonly known as: TYLENOL Take 1,000 mg by mouth at bedtime.   AQUAPHOR ADVANCED THERAPY EX Apply 1 application topically as needed (red areas of skin).   glycopyrrolate 1 MG tablet Commonly known as: ROBINUL Take 1 mg by mouth 2 (two) times daily.       Disposition and follow-up:   Ms.Anwitha Culliver was discharged from [facility] in [place] condition.  At the hospital follow up visit please address:   1.  Please ensure transition to hospice and comfort care.  2.  Labs / imaging needed at time of follow-up: None  3.  Pending labs/ test needing follow-up: None  Follow-up Appointments:   Hospital Course by problem list:  1. Septic Shock due to UTI -pt presenting with 4-5 days of AMS, found to be in  septic shock from pseudomonas UTI with hypothermia -transferred to ICU for pressors  -completed 7 days of cefepime -palliative consulted in ICU for persistent AMS and poor PO intake -CT head and MRI brain unremarkable -unable to tolerate modified barium swallow due to AMS -nursing concerns that patient is in the stages of dying -family chose to pursue comfort care and hospice placement  Discharge Vitals:   BP 101/68 (BP Location: Left Wrist)   Pulse 80   Temp 98.8 F (37.1 C) (Oral)   Resp 16   Ht 5\' 3"  (1.6 m)   Wt 80.2 kg   SpO2 100%   BMI 31.32 kg/m   Pertinent Labs, Studies, and Procedures:  Results for orders placed or performed during the hospital encounter of 10/02/19 (from the past 24 hour(s))  Glucose, capillary     Status: None   Collection Time: 10/08/19  4:00 PM  Result Value Ref Range   Glucose-Capillary 93 70 - 99 mg/dL  APTT     Status: Abnormal   Collection Time: 10/08/19  6:49 PM  Result Value Ref Range   aPTT >200 (HH) 24 - 36 seconds  Heparin level (unfractionated)     Status: Abnormal   Collection Time: 10/08/19  6:49 PM  Result Value Ref Range   Heparin Unfractionated 2.08 (H) 0.30 - 0.70 IU/mL  Glucose, capillary     Status: None  Collection Time: 10/08/19  7:34 PM  Result Value Ref Range   Glucose-Capillary 77 70 - 99 mg/dL  APTT     Status: Abnormal   Collection Time: 10/08/19  8:24 PM  Result Value Ref Range   aPTT >200 (HH) 24 - 36 seconds  Heparin level (unfractionated)     Status: Abnormal   Collection Time: 10/08/19  8:24 PM  Result Value Ref Range   Heparin Unfractionated 2.00 (H) 0.30 - 0.70 IU/mL  Glucose, capillary     Status: None   Collection Time: 10/08/19 11:10 PM  Result Value Ref Range   Glucose-Capillary 85 70 - 99 mg/dL  Glucose, capillary     Status: None   Collection Time: 10/09/19  4:23 AM  Result Value Ref Range   Glucose-Capillary 77 70 - 99 mg/dL  APTT     Status: None   Collection Time: 10/09/19  5:00 AM  Result  Value Ref Range   aPTT 31 24 - 36 seconds  Comprehensive metabolic panel     Status: Abnormal   Collection Time: 10/09/19  5:00 AM  Result Value Ref Range   Sodium 142 135 - 145 mmol/L   Potassium 3.2 (L) 3.5 - 5.1 mmol/L   Chloride 116 (H) 98 - 111 mmol/L   CO2 21 (L) 22 - 32 mmol/L   Glucose, Bld 89 70 - 99 mg/dL   BUN 29 (H) 8 - 23 mg/dL   Creatinine, Ser 0.96 0.44 - 1.00 mg/dL   Calcium 8.4 (L) 8.9 - 10.3 mg/dL   Total Protein 5.1 (L) 6.5 - 8.1 g/dL   Albumin 1.5 (L) 3.5 - 5.0 g/dL   AST 37 15 - 41 U/L   ALT 27 0 - 44 U/L   Alkaline Phosphatase 99 38 - 126 U/L   Total Bilirubin 0.8 0.3 - 1.2 mg/dL   GFR calc non Af Amer 54 (L) >60 mL/min   GFR calc Af Amer >60 >60 mL/min   Anion gap 5 5 - 15  Glucose, capillary     Status: None   Collection Time: 10/09/19  7:28 AM  Result Value Ref Range   Glucose-Capillary 78 70 - 99 mg/dL  CBC     Status: Abnormal   Collection Time: 10/09/19  7:29 AM  Result Value Ref Range   WBC 7.8 4.0 - 10.5 K/uL   RBC 3.76 (L) 3.87 - 5.11 MIL/uL   Hemoglobin 10.8 (L) 12.0 - 15.0 g/dL   HCT 33.6 (L) 36.0 - 46.0 %   MCV 89.4 80.0 - 100.0 fL   MCH 28.7 26.0 - 34.0 pg   MCHC 32.1 30.0 - 36.0 g/dL   RDW 17.9 (H) 11.5 - 15.5 %   Platelets  150 - 400 K/uL    PLATELET CLUMPS NOTED ON SMEAR, COUNT APPEARS DECREASED   nRBC 0.9 (H) 0.0 - 0.2 %   Component 7 d ago  Specimen Description URINE, RANDOM   Special Requests NONE   Culture >=100,000 COLONIES/mL PSEUDOMONAS AERUGINOSAAbnormal    Report Status 10/05/2019 FINAL   Organism ID, Bacteria PSEUDOMONAS AERUGINOSAAbnormal     Component 7 d ago  Specimen Description BLOOD RIGHT ANTECUBITAL   Special Requests BOTTLES DRAWN AEROBIC AND ANAEROBIC Blood Culture results may not be optimal due to an inadequate volume of blood received in culture bottles  Culture NO GROWTH 5 DAYS  Performed at [facility], [address] [place], [place] [address]   Report Status 10/07/2019 FINAL  CT Head Wo  Contrast  Result Date: 10/02/2019 CLINICAL DATA:  Pt brought to ED by EMS from home. Daughter reported pt has had AMS for about a week. Per family, pts PCP told family to bring to ED if condition worsened. Daughter reports pt's AMS was worse this morning. EXAM: CT HEAD WITHOUT CONTRAST TECHNIQUE: Contiguous axial images were obtained from the base of the skull through the vertex without intravenous contrast. COMPARISON:  03/07/2016 FINDINGS: Brain: No evidence of acute infarction, hemorrhage, hydrocephalus, extra-axial collection or mass lesion/mass effect. There are old infarcts, right MCA distribution, middle to posterior frontal lobe and left cerebellum. Vascular: No hyperdense vessel or unexpected calcification. Skull: Normal. Negative for fracture or focal lesion. Sinuses/Orbits: Globes and orbits are unremarkable. Visualized sinuses and mastoid air cells are clear. Other: None. IMPRESSION: 1. No acute intracranial abnormalities. 2. Old right MCA distribution and left cerebellar infarcts. Electronically Signed   By: Lajean Manes M.D.   On: 10/02/2019 10:11   MR BRAIN W WO CONTRAST  Result Date: 10/08/2019 CLINICAL DATA:  Encephalopathy EXAM: MRI HEAD WITHOUT AND WITH CONTRAST TECHNIQUE: Multiplanar, multiecho pulse sequences of the brain and surrounding structures were obtained without and with intravenous contrast. CONTRAST:  30mL GADAVIST GADOBUTROL 1 MMOL/ML IV SOLN COMPARISON:  MRI head 03/08/2016.  CT head 10/05/2019 FINDINGS: Brain: Motion degraded study. Negative for acute infarct. Chronic right MCA infarct. Atrophy with mild ventricular enlargement, stable. Large extra-axial cyst left posterior fossa measuring 28 x 54 mm. This is unchanged. Negative for hemorrhage or mass Postcontrast images degraded by significant motion. Allowing for this, no abnormal enhancement. Vascular: Normal arterial flow voids. Skull and upper cervical spine: Negative Sinuses/Orbits: Paranasal sinuses clear. Bilateral  cataract surgery. Other: None IMPRESSION: Negative for acute infarct or mass. Atrophy with chronic right MCA infarct. Posterior fossa arachnoid cyst on the left. Electronically Signed   By: Franchot Gallo M.D.   On: 10/08/2019 13:31   DG CHEST PORT 1 VIEW  Result Date: 10/05/2019 CLINICAL DATA:  Pneumonia. EXAM: PORTABLE CHEST 1 VIEW COMPARISON:  October 02, 2019 FINDINGS: A right PICC line is been placed in the interval. The distal tip is probably in the right side of the atrium, 3 cm below the caval atrial junction. No pneumothorax. The right lung is clear. The cardiomediastinal silhouette is stable. Mild opacity may persists in the retrocardiac region, not well assessed. No other abnormalities. IMPRESSION: 1. A new right PICC line terminates with the tip in the right atrium. Recommend withdrawing 3 cm. 2. Mild opacity in the left retrocardiac region may persists, possibly atelectasis. Recommend attention on follow-up. These results will be called to the ordering clinician or representative by the Radiologist Assistant, and communication documented in the PACS or zVision Dashboard. Electronically Signed   By: Dorise Bullion III M.D   On: 10/05/2019 09:32   DG Chest Port 1 View  Result Date: 10/02/2019 CLINICAL DATA:  Cough and shortness of breath EXAM: PORTABLE CHEST 1 VIEW COMPARISON:  Chest x-ray 12/27/2015 FINDINGS: The heart is borderline enlarged but stable. Stable tortuosity and calcification of the thoracic aorta. Left basilar density could reflect atelectasis or infiltrate. No pleural effusions. IMPRESSION: Left lower lobe atelectasis versus infiltrate. Electronically Signed   By: Marijo Sanes M.D.   On: 10/02/2019 08:54   ECHOCARDIOGRAM COMPLETE  Result Date: 10/02/2019   ECHOCARDIOGRAM REPORT   Patient Name:   MADILYNN SERENO Date of Exam: 10/02/2019 Medical Rec #:  [id number]       Height:  63.0 in Accession #:    [id number]      Weight:       210.0 lb Date of Birth:  28-Oct-1930        BSA:          1.97 m Patient Age:    54 years        BP:           91/67 mmHg Patient Gender: F               HR:           93 bpm. Exam Location:  Inpatient Procedure: 2D Echo, Cardiac Doppler and Color Doppler Indications:    I50.23 Acute on chronic systolic (congestive) heart failure  History:        Patient has prior history of Echocardiogram examinations, most                 recent 03/09/2016. Stroke; Risk Factors:Hypertension. Cancer.                 GERD.  Sonographer:    Jonelle Sidle Dance Referring Phys: [id number] [place]  1. Technically difficult study. Left ventricular ejection fraction, by visual estimation, is grossly 30 to 35%. The left ventricle has moderate to severely decreased function. Image quality is insufficient to assess for regional wall motion abnormalities. There is mildly increased left ventricular hypertrophy.  2. Left ventricular diastolic parameters are indeterminate.  3. Global right ventricle has normal systolic function.The right ventricular size is normal.  4. Left atrial size was normal.  5. Right atrial size was normal.  6. The mitral valve is abnormal. Moderate annular calcification. Trivial mitral valve regurgitation.  7. The tricuspid valve is normal in structure.  8. The aortic valve is abnormal. Aortic valve regurgitation is trivial. Mild to moderate aortic valve sclerosis/calcification without any evidence of aortic stenosis.  9. The pulmonic valve was grossly normal. Pulmonic valve regurgitation is not visualized. 10. The tricuspid regurgitant velocity is 3.04 m/s, and with an assumed right atrial pressure of 3 mmHg, the estimated right ventricular systolic pressure is mildly elevated at 40.0 mmHg. 11. The inferior vena cava is normal in size with greater than 50% respiratory variability, suggesting right atrial pressure of 3 mmHg. FINDINGS  Left Ventricle: Left ventricular ejection fraction, by visual estimation, is 30 to 35%. The left ventricle has moderate to  severely decreased function. The left ventricle demonstrates global hypokinesis. There is mildly increased left ventricular hypertrophy. Left ventricular diastolic parameters are indeterminate. Right Ventricle: The right ventricular size is normal. No increase in right ventricular wall thickness. Global RV systolic function is has normal systolic function. The tricuspid regurgitant velocity is 3.04 m/s, and with an assumed right atrial pressure  of 3 mmHg, the estimated right ventricular systolic pressure is mildly elevated at 40.0 mmHg. Left Atrium: Left atrial size was normal in size. Right Atrium: Right atrial size was normal in size Pericardium: There is no evidence of pericardial effusion. Mitral Valve: The mitral valve is abnormal. Moderate mitral annular calcification. Trivial mitral valve regurgitation. Tricuspid Valve: The tricuspid valve is normal in structure. Tricuspid valve regurgitation is mild. Aortic Valve: The aortic valve is abnormal. Aortic valve regurgitation is trivial. Mild to moderate aortic valve sclerosis/calcification is present, without any evidence of aortic stenosis. Aortic valve mean gradient measures 4.0 mmHg. Aortic valve peak gradient measures 7.1 mmHg. Aortic valve area, by VTI measures 1.27 cm. Pulmonic Valve: The pulmonic valve was grossly normal. Pulmonic  valve regurgitation is not visualized. Pulmonic regurgitation is not visualized. Aorta: The aortic root is normal in size and structure. Venous: The inferior vena cava is normal in size with greater than 50% respiratory variability, suggesting right atrial pressure of 3 mmHg. IAS/Shunts: The interatrial septum was not well visualized.  LEFT VENTRICLE PLAX 2D LVIDd:         5.50 cm  Diastology LVIDs:         3.80 cm  LV e' medial:   9.29 cm/s LV PW:         1.00 cm  LV E/e' medial: 15.2 LV IVS:        1.10 cm LVOT diam:     2.10 cm LV SV:         85 ml LV SV Index:   40.63 LVOT Area:     3.46 cm  RIGHT VENTRICLE             IVC  RV Basal diam:  2.90 cm     IVC diam: 1.50 cm RV S prime:     12.30 cm/s TAPSE (M-mode): 1.3 cm LEFT ATRIUM             Index       RIGHT ATRIUM           Index LA diam:        4.80 cm 2.43 cm/m  RA Area:     16.60 cm LA Vol (A2C):   61.5 ml 31.15 ml/m RA Volume:   37.20 ml  18.84 ml/m LA Vol (A4C):   45.6 ml 23.10 ml/m LA Biplane Vol: 54.5 ml 27.61 ml/m  AORTIC VALVE AV Area (Vmax):    1.46 cm AV Area (Vmean):   1.33 cm AV Area (VTI):     1.27 cm AV Vmax:           133.00 cm/s AV Vmean:          90.800 cm/s AV VTI:            0.286 m AV Peak Grad:      7.1 mmHg AV Mean Grad:      4.0 mmHg LVOT Vmax:         55.93 cm/s LVOT Vmean:        34.967 cm/s LVOT VTI:          0.105 m LVOT/AV VTI ratio: 0.37  AORTA Ao Root diam: 3.80 cm Ao Asc diam:  3.10 cm MITRAL VALVE                        TRICUSPID VALVE MV Area (PHT): 5.81 cm             TR Peak grad:   37.0 mmHg MV PHT:        37.84 msec           TR Vmax:        304.00 cm/s MV Decel Time: 131 msec MV E velocity: 141.50 cm/s 103 cm/s SHUNTS                                     Systemic VTI:  0.10 m                                     Systemic Diam:  2.10 cm  Oswaldo Milian MD Electronically signed by Oswaldo Milian MD Signature Date/Time: 10/02/2019/9:33:47 PM    Final    [place] EKG SITE RITE  Result Date: 10/03/2019 If Site Rite image not attached, placement could not be confirmed due to current cardiac rhythm.  Discharge Instructions: Discharge Instructions    Diet - low sodium heart healthy   Complete by: As directed    Increase activity slowly   Complete by: As directed       Signed: Jean Rosenthal, MD 10/09/2019, 2:10 PM   Pager: [phone]

## 2019-10-09 NOTE — Progress Notes (Addendum)
Subjective:   Pt seen at the bedside this AM. Pt is awake but does not respond to questions or follow commands. Intermittently grunts throughout the visit.   Consults: palliative  Objective:  Vital signs in last 24 hours: Vitals:   10/08/19 1942 10/08/19 2315 10/09/19 0412 10/09/19 0720  BP: (!) 112/57 (!) 95/55 (!) 92/55 101/68  Pulse: 70 73 82 80  Resp: 17 17 20 16   Temp: 97.8 F (36.6 C) 98 F (36.7 C) 97.8 F (36.6 C) 98.8 F (37.1 C)  TempSrc: Oral Oral Oral Oral  SpO2: 100% 100% 100%   Weight:      Height:       Physical Exam  Constitutional:  Elderly lady lying in bed in no acute distress  Cardiovascular: Normal rate, regular rhythm and normal heart sounds.  No murmur heard. LE pitting edema  Pulmonary/Chest: Effort normal and breath sounds normal. No respiratory distress.  Abdominal: Soft. Bowel sounds are normal. She exhibits no distension.  Neurological: She is alert.  Not oriented; doesn't respond to questions or follow commands, only groans  Skin: Skin is dry.  Nursing note and vitals reviewed.  I/Os:  Intake/Output Summary (Last 24 hours) at 10/09/2019 1021 Last data filed at 10/09/2019 1020 Gross per 24 hour  Intake 1292.18 ml  Output 1300 ml  Net -7.82 ml   Labs: Results for orders placed or performed during the hospital encounter of 10/02/19 (from the past 24 hour(s))  Glucose, capillary     Status: None   Collection Time: 10/08/19 11:22 AM  Result Value Ref Range   Glucose-Capillary 70 70 - 99 mg/dL  Glucose, capillary     Status: None   Collection Time: 10/08/19  4:00 PM  Result Value Ref Range   Glucose-Capillary 93 70 - 99 mg/dL  APTT     Status: Abnormal   Collection Time: 10/08/19  6:49 PM  Result Value Ref Range   aPTT >200 (HH) 24 - 36 seconds  Heparin level (unfractionated)     Status: Abnormal   Collection Time: 10/08/19  6:49 PM  Result Value Ref Range   Heparin Unfractionated 2.08 (H) 0.30 - 0.70 IU/mL  Glucose, capillary      Status: None   Collection Time: 10/08/19  7:34 PM  Result Value Ref Range   Glucose-Capillary 77 70 - 99 mg/dL  APTT     Status: Abnormal   Collection Time: 10/08/19  8:24 PM  Result Value Ref Range   aPTT >200 (HH) 24 - 36 seconds  Heparin level (unfractionated)     Status: Abnormal   Collection Time: 10/08/19  8:24 PM  Result Value Ref Range   Heparin Unfractionated 2.00 (H) 0.30 - 0.70 IU/mL  Glucose, capillary     Status: None   Collection Time: 10/08/19 11:10 PM  Result Value Ref Range   Glucose-Capillary 85 70 - 99 mg/dL  Glucose, capillary     Status: None   Collection Time: 10/09/19  4:23 AM  Result Value Ref Range   Glucose-Capillary 77 70 - 99 mg/dL  APTT     Status: None   Collection Time: 10/09/19  5:00 AM  Result Value Ref Range   aPTT 31 24 - 36 seconds  Comprehensive metabolic panel     Status: Abnormal   Collection Time: 10/09/19  5:00 AM  Result Value Ref Range   Sodium 142 135 - 145 mmol/L   Potassium 3.2 (L) 3.5 - 5.1 mmol/L   Chloride 116 (H)  98 - 111 mmol/L   CO2 21 (L) 22 - 32 mmol/L   Glucose, Bld 89 70 - 99 mg/dL   BUN 29 (H) 8 - 23 mg/dL   Creatinine, Ser 0.96 0.44 - 1.00 mg/dL   Calcium 8.4 (L) 8.9 - 10.3 mg/dL   Total Protein 5.1 (L) 6.5 - 8.1 g/dL   Albumin 1.5 (L) 3.5 - 5.0 g/dL   AST 37 15 - 41 U/L   ALT 27 0 - 44 U/L   Alkaline Phosphatase 99 38 - 126 U/L   Total Bilirubin 0.8 0.3 - 1.2 mg/dL   GFR calc non Af Amer 54 (L) >60 mL/min   GFR calc Af Amer >60 >60 mL/min   Anion gap 5 5 - 15  Glucose, capillary     Status: None   Collection Time: 10/09/19  7:28 AM  Result Value Ref Range   Glucose-Capillary 78 70 - 99 mg/dL  CBC     Status: Abnormal   Collection Time: 10/09/19  7:29 AM  Result Value Ref Range   WBC 7.8 4.0 - 10.5 K/uL   RBC 3.76 (L) 3.87 - 5.11 MIL/uL   Hemoglobin 10.8 (L) 12.0 - 15.0 g/dL   HCT 33.6 (L) 36.0 - 46.0 %   MCV 89.4 80.0 - 100.0 fL   MCH 28.7 26.0 - 34.0 pg   MCHC 32.1 30.0 - 36.0 g/dL   RDW 17.9 (H)  11.5 - 15.5 %   Platelets  150 - 400 K/uL    PLATELET CLUMPS NOTED ON SMEAR, COUNT APPEARS DECREASED   nRBC 0.9 (H) 0.0 - 0.2 %   Imaging: MR BRAIN W WO CONTRAST  Result Date: 10/08/2019 CLINICAL DATA:  Encephalopathy EXAM: MRI HEAD WITHOUT AND WITH CONTRAST TECHNIQUE: Multiplanar, multiecho pulse sequences of the brain and surrounding structures were obtained without and with intravenous contrast. CONTRAST:  44mL GADAVIST GADOBUTROL 1 MMOL/ML IV SOLN COMPARISON:  MRI head 03/08/2016.  CT head 10/05/2019 FINDINGS: Brain: Motion degraded study. Negative for acute infarct. Chronic right MCA infarct. Atrophy with mild ventricular enlargement, stable. Large extra-axial cyst left posterior fossa measuring 28 x 54 mm. This is unchanged. Negative for hemorrhage or mass Postcontrast images degraded by significant motion. Allowing for this, no abnormal enhancement. Vascular: Normal arterial flow voids. Skull and upper cervical spine: Negative Sinuses/Orbits: Paranasal sinuses clear. Bilateral cataract surgery. Other: None IMPRESSION: Negative for acute infarct or mass. Atrophy with chronic right MCA infarct. Posterior fossa arachnoid cyst on the left. Electronically Signed   By: Franchot Gallo M.D.   On: 10/08/2019 13:31    Assessment/Plan:  Assessment: Sharon Velasquez is a pleasant 86 yo F w/ a PMHx of HFrEF (09/2019, EF 30-35%), hx of CVA and hx of PE who presented with AMS found to be in septic shock from UTI treated with antibiotics in the ICU no back to floor for workup of her worsening AMS.   Plan: Principal Problem:   Septic shock ([place]) Active Problems:   Lower urinary tract infectious disease   Hypernatremia   Acute encephalopathy   Chronic systolic CHF (congestive heart failure) ([place])   Stage 2 skin ulcer of sacral region [facility])  # Acute encephalopathy: Patient is alert and talkative at baseline. She is now somnolent and does not respond to questions or follow commands. Infection seems to be  resolving and no electrolyte abnormalities to explain AMS. Not on any centrally acting medications. She does have a history of CVA and we are not able to perform  a complete neuro exam as she is not following commands. MRI brain w/ out acute CVA.  Delirium and cephalosporin neurotoxicity is also in the differential. Completed cephalosporin course yesterday. PCCM consulted palliative care 1/20 for [place] discussion given persistent AMS and poor PO intake. Palliative talked with family and daughter who wants comfort care, starting morphine, looking for inpatient hospice. - start morphine - fu hospice placement  Dispo: Anticipated discharge to hospice pending bed availability.  Al Decant, MD 10/09/2019, 10:21 AM Pager: [phone]

## 2019-10-09 NOTE — Progress Notes (Signed)
[facility]:  [facility]  Reviewed chart and spoke to our MD Dr. Elinor Parkinson at [facility] in [facility] she has been accepted and can go today. I am having some trouble connecting with the daughter Kyra Searles. Unable to leave voicemail to return call due to VM being full. Will continue to try.   Spoke to [place] CM to update her as well. As soon as we are able to get paperwork signed by daughter the pt can go to our inpt. Facility.   Webb Silversmith RN [phone]

## 2019-10-09 NOTE — Plan of Care (Signed)
Pt's care plan complete. Pt going to hospice SNF.

## 2019-10-09 NOTE — Care Management (Addendum)
Sharon Velasquez with [facility] has contacted daughter and arranged for paperwork to be done. Paged Dr Madilyn Fireman, will leave PTAR paperwork in shadow chart for bedside nurse to call when discharge complete. Need DNR form signed. Awaiting call back .Spoke to Dr Madilyn Fireman she will work on discharge and sign DNR form.  PTAR called for 3:30 pm    Call daughter Sharon Velasquez no answer and unable to leave voice mail.   [name] RN

## 2019-10-09 NOTE — Progress Notes (Signed)
  Speech Language Pathology Treatment: Dysphagia  Patient Details Name: Sharon Velasquez MRN: [id number] DOB: 1931-07-25 Today's Date: 10/09/2019 Time: 0900-0910 SLP Time Calculation (min) (ACUTE ONLY): 10 min  Assessment / Plan / Recommendation Clinical Impression  Pt seen this am, again attempted gently offering tastes of water, juice, ice and puree. Pt turned head away and seemed to refuse all efforts. When an ice chip was placed in her mouth, she did not orally manipulate it and continued throat clearing. Since session completed, pts family has decided to transition to comfort measures and SLP ordered d/c'd. Will sign off at this time.   HPI HPI: 86 year-old female with a medical history of CVA in 2017, recurrent UTIs, breast cancer, Steven-Johnson syndrome, CHF, PE on Eliquis, and MSSA bacteremia who presents to the ED from home for evaluation of AMS. Dx sepsis secondary to UTI, metabolic encephalopathy. MBS in 2017 after acute stroke revealed dysphagia with recs for nectar liquids (due to aspiration of thins) and purees. Pt has been bedbound for years; alert/conversational at baseline per notes.      SLP Plan  Discharge SLP treatment due to (comment)       Recommendations  Diet recommendations: Other(comment)(Comfort feeding)                Plan: Discharge SLP treatment due to (comment)       GO               Herbie [place], MA [place] Pager [phone] Office [phone]  Lynann Beaver 10/09/2019, 11:19 AM

## 2019-10-11 LAB — CULTURE, BLOOD (ROUTINE X 2)
Culture: NO GROWTH
Culture: NO GROWTH
Special Requests: ADEQUATE

## 2019-10-19 DEATH — deceased
